# Patient Record
Sex: Female | Born: 1940 | Race: Black or African American | Hispanic: No | Smoking: Former smoker
Health system: Southern US, Community
[De-identification: ages and names within clinical notes are randomized; demographics above are authoritative.]

## PROBLEM LIST (undated history)

## (undated) DIAGNOSIS — IMO0002 Reserved for concepts with insufficient information to code with codable children: Secondary | ICD-10-CM

## (undated) DIAGNOSIS — Z973 Presence of spectacles and contact lenses: Secondary | ICD-10-CM

## (undated) DIAGNOSIS — IMO0001 Reserved for inherently not codable concepts without codable children: Secondary | ICD-10-CM

## (undated) DIAGNOSIS — K219 Gastro-esophageal reflux disease without esophagitis: Secondary | ICD-10-CM

## (undated) DIAGNOSIS — I10 Essential (primary) hypertension: Secondary | ICD-10-CM

## (undated) DIAGNOSIS — R634 Abnormal weight loss: Secondary | ICD-10-CM

## (undated) DIAGNOSIS — Z972 Presence of dental prosthetic device (complete) (partial): Secondary | ICD-10-CM

## (undated) DIAGNOSIS — E785 Hyperlipidemia, unspecified: Secondary | ICD-10-CM

## (undated) DIAGNOSIS — M1990 Unspecified osteoarthritis, unspecified site: Secondary | ICD-10-CM

## (undated) DIAGNOSIS — J40 Bronchitis, not specified as acute or chronic: Secondary | ICD-10-CM

## (undated) DIAGNOSIS — K8689 Other specified diseases of pancreas: Secondary | ICD-10-CM

## (undated) HISTORY — PX: TONSILLECTOMY: SUR1361

## (undated) HISTORY — DX: Reserved for inherently not codable concepts without codable children: IMO0001

## (undated) HISTORY — PX: BELPHAROPTOSIS REPAIR: SHX369

## (undated) HISTORY — DX: Reserved for concepts with insufficient information to code with codable children: IMO0002

## (undated) HISTORY — PX: APPENDECTOMY: SHX54

---

## 1975-09-03 HISTORY — PX: TUBAL LIGATION: SHX77

## 1991-09-03 HISTORY — PX: TOTAL ABDOMINAL HYSTERECTOMY W/ BILATERAL SALPINGOOPHORECTOMY: SHX83

## 1999-06-19 ENCOUNTER — Encounter: Admission: RE | Admit: 1999-06-19 | Discharge: 1999-06-19 | Payer: Self-pay | Admitting: Family Medicine

## 1999-06-19 ENCOUNTER — Encounter: Payer: Self-pay | Admitting: Family Medicine

## 1999-08-08 ENCOUNTER — Encounter: Admission: RE | Admit: 1999-08-08 | Discharge: 1999-08-08 | Payer: Self-pay | Admitting: Family Medicine

## 1999-08-08 ENCOUNTER — Encounter: Payer: Self-pay | Admitting: Family Medicine

## 2000-01-06 ENCOUNTER — Emergency Department (HOSPITAL_COMMUNITY): Admission: EM | Admit: 2000-01-06 | Discharge: 2000-01-06 | Payer: Self-pay

## 2000-10-29 ENCOUNTER — Encounter: Admission: RE | Admit: 2000-10-29 | Discharge: 2000-10-29 | Payer: Self-pay | Admitting: Family Medicine

## 2000-10-29 ENCOUNTER — Encounter: Payer: Self-pay | Admitting: Family Medicine

## 2002-09-13 ENCOUNTER — Encounter: Admission: RE | Admit: 2002-09-13 | Discharge: 2002-09-13 | Payer: Self-pay | Admitting: Family Medicine

## 2002-09-13 ENCOUNTER — Encounter: Payer: Self-pay | Admitting: Family Medicine

## 2002-10-01 ENCOUNTER — Encounter: Admission: RE | Admit: 2002-10-01 | Discharge: 2002-10-01 | Payer: Self-pay | Admitting: Family Medicine

## 2002-10-01 ENCOUNTER — Encounter: Payer: Self-pay | Admitting: Family Medicine

## 2003-03-09 ENCOUNTER — Emergency Department (HOSPITAL_COMMUNITY): Admission: EM | Admit: 2003-03-09 | Discharge: 2003-03-09 | Payer: Self-pay

## 2004-03-14 ENCOUNTER — Encounter: Admission: RE | Admit: 2004-03-14 | Discharge: 2004-03-14 | Payer: Self-pay | Admitting: Family Medicine

## 2004-12-13 ENCOUNTER — Encounter: Admission: RE | Admit: 2004-12-13 | Discharge: 2004-12-13 | Payer: Self-pay | Admitting: Family Medicine

## 2005-05-30 ENCOUNTER — Ambulatory Visit (HOSPITAL_COMMUNITY): Admission: RE | Admit: 2005-05-30 | Discharge: 2005-05-30 | Payer: Self-pay | Admitting: Gastroenterology

## 2006-07-29 ENCOUNTER — Encounter: Admission: RE | Admit: 2006-07-29 | Discharge: 2006-07-29 | Payer: Self-pay | Admitting: Family Medicine

## 2006-12-02 HISTORY — PX: CARDIAC CATHETERIZATION: SHX172

## 2006-12-11 ENCOUNTER — Inpatient Hospital Stay (HOSPITAL_BASED_OUTPATIENT_CLINIC_OR_DEPARTMENT_OTHER): Admission: RE | Admit: 2006-12-11 | Discharge: 2006-12-11 | Payer: Self-pay | Admitting: Interventional Cardiology

## 2007-09-10 ENCOUNTER — Encounter: Admission: RE | Admit: 2007-09-10 | Discharge: 2007-09-10 | Payer: Self-pay | Admitting: Family Medicine

## 2008-11-03 ENCOUNTER — Encounter: Admission: RE | Admit: 2008-11-03 | Discharge: 2008-11-03 | Payer: Self-pay | Admitting: Family Medicine

## 2010-08-02 ENCOUNTER — Encounter: Admission: RE | Admit: 2010-08-02 | Discharge: 2010-08-02 | Payer: Self-pay | Admitting: Family Medicine

## 2010-09-24 LAB — URINALYSIS, ROUTINE W REFLEX MICROSCOPIC
Ketones, ur: NEGATIVE mg/dL
Leukocytes, UA: NEGATIVE
Nitrite: NEGATIVE
Protein, ur: 30 mg/dL — AB
Specific Gravity, Urine: >1.03 — ABNORMAL HIGH (ref 1.005–1.030)
Urine Glucose, Fasting: NEGATIVE mg/dL
Urobilinogen, UA: 0.2 mg/dL (ref 0.0–1.0)
pH: 5.5 (ref 5.0–8.0)

## 2010-09-24 LAB — COMPREHENSIVE METABOLIC PANEL
ALT: 21 U/L (ref 0–35)
AST: 24 U/L (ref 0–37)
Albumin: 4 g/dL (ref 3.5–5.2)
Alkaline Phosphatase: 93 U/L (ref 39–117)
BUN: 15 mg/dL (ref 6–23)
Calcium: 9.4 mg/dL (ref 8.4–10.5)
Glucose, Bld: 86 mg/dL (ref 70–99)
Potassium: 3.6 mEq/L (ref 3.5–5.1)
Sodium: 139 mEq/L (ref 135–145)
Total Bilirubin: 0.6 mg/dL (ref 0.3–1.2)
Total Protein: 7.5 g/dL (ref 6.0–8.3)

## 2010-09-24 LAB — CBC
MCH: 28.2 pg (ref 26.0–34.0)
RDW: 13.4 % (ref 11.5–15.5)
WBC: 5.9 10*3/uL (ref 4.0–10.5)

## 2010-09-24 LAB — URINE MICROSCOPIC-ADD ON

## 2010-09-26 ENCOUNTER — Ambulatory Visit (HOSPITAL_COMMUNITY)
Admission: RE | Admit: 2010-09-26 | Discharge: 2010-09-27 | Payer: Self-pay | Source: Home / Self Care | Attending: Obstetrics and Gynecology | Admitting: Obstetrics and Gynecology

## 2010-09-27 LAB — CBC
HCT: 34.9 % — ABNORMAL LOW (ref 36.0–46.0)
MCH: 27.9 pg (ref 26.0–34.0)

## 2010-10-08 NOTE — Op Note (Signed)
[name], Sheila Hurst              ACCOUNT NO.:  [id number]  MEDICAL RECORD NO.:  [id number]          PATIENT TYPE:  OIB  LOCATION:  9316                          FACILITY:  [facility]  PHYSICIAN:  Guy Sandifer. Henderson Cloud, M.D. DATE OF BIRTH:  08/01/1941  DATE OF PROCEDURE:  09/26/2010 DATE OF DISCHARGE:                              OPERATIVE REPORT   PREOPERATIVE DIAGNOSES: 1. Pelvic relaxation. 2. Stress urinary continence.  POSTOPERATIVE DIAGNOSES: 1. Pelvic relaxation. 2. Stress urinary continence  PROCEDURE:  Anterior colporrhaphy, vaginal colpopexy, posterior colporrhaphy, transobturator mid urethral sling, and cystoscopy.  SURGEON:  Guy Sandifer. Henderson Cloud, M.D.  ANESTHESIA:  General with endotracheal intubation.  ESTIMATED BLOOD LOSS:  Less than 100 mL.  INDICATIONS AND CONSENT:  The patient is a 70 year old widowed black female G2, P2, status post hysterectomy with symptomatic pelvic relaxation and stress incontinence.  Details are dictated in the history and physical.  Anterior-posterior colporrhaphy mid urethral sling has been discussed preoperatively.  Potential risks and complications have been discussed preoperatively including but limited to infection, organ damage, bleeding requiring transfusion of blood products with HIV and hepatitis acquisition, DVT/PE, pneumonia, laparotomy, and pelvic pain. Vaginal narrowing, need for dilators, is sexually active, painful intercourse, pelvic pain, and recurrent pelvic relaxation have been discussed.  Success and failure rates of the sling, recurrent stress incontinence, postoperative irritative voiding symptoms, delayed healing, erosion, extrusion, pelvic pain, painful intercourse, prolonged catheterization, self-catheterization, return to the operating room have also been discussed.  All questions have been answered and consent was signed on the chart.  PROCEDURE:  The patient was taken to the operating room, where she was identified,  placed in dorsal supine position, and general anesthesia was induced via endotracheal intubation.  She was placed in the dorsal lithotomy position, where a time-out was undertaken.  She was then prepped and draped in a sterile fashion.  A Foley catheter was placed in the bladder.  The bladder was drained and the catheter was left in place.  The anterior vaginal mucosa was then injected with a dilute solution of 20 mL of 0.5% lidocaine with 1:200,000 epinephrine, diluted in 100 mL of normal saline.  An inverted T-shaped incision was made from the vaginal apex down the midline.  The vaginal mucosa was dissected from the underlying bladder sharply.  The cystocele was then reduced first with a pursestring and then with interrupted sutures, reapproximated the vesicovaginal fascia with 0 Monocryl pops.  This reduced the cystocele well.  Excess vaginal mucosa was trimmed, and the anterior vaginal mucosa was closed in a running locking fashion with 2-0 Monocryl suture.  The suburethral mucosa as well as the site for the transobturator incision after being marked are also injected with the same dilute solution.  Stab incisions were made bilaterally over the obturator foramen.  A midline suburethral incision was also made and dissection is carried out bilaterally toward the urogenital diaphragms. Then, using the Obtryx kit with the halo needles, the needles were passed through the incisions over the obturator foramen and exited below the urethra with passage of the needle tip guide with the examining finger.  After both were placed, the  Foley catheter is removed.  The patient has been given indigo carmine.  Cystoscopy with a 70-degree cystoscope was then carried out.  A 360-degree inspection reveals no foreign bodies, no evidence of perforation, and a good puff of urine from the ureters bilaterally.  Cystoscope was removed.  Foley catheter was replaced, and the bladder was drained.  The Obtryx  polypropylene mesh sling was then placed on the needle tips bilaterally which were withdrawn back through the incisions.  Sheath was removed.  The sling was lying flat with no rolls or kinks.  Care was taken to make sure that a Kelly clamp placed below the sling can easily be rotated perpendicular to the floor with no tension and there was 2- to 3-mm of space between the urethra and the sling.  Excess sling was trimmed at the level of the skin bilaterally which was closed with Dermabond.  The suburethral incision was closed in running locking fashion with a 2-0 Vicryl.  The posterior vaginal mucosa was then injected with the same dilute solution.  A small diamond shaped wedge of tissue was removed from the perineal body and the posterior vaginal mucosa was taken down in the midline approximately one-half to two-third to the length of the posterior vaginal mucosa.  Dissection was carried out bilaterally as well.  The rectovaginal mucosa was then reapproximated with interrupted 0 Monocryl suture.  Excess vaginal mucosa was trimmed, and the posterior vaginal mucosa was closed in running locking fashion with a 2-0 Vicryl suture as well.  Reinspection reveals no evidence of perforation of the vaginal mucosa with the sling as well as good hemostasis.  Vagina was then packed with a plain 1-inch gauze with estrogen vaginal cream.  All counts were correct.  The patient was awakened and taken to  the recovery room in stable condition.     Guy Sandifer Henderson Cloud, M.D.     JET/MEDQ  D:  09/26/2010  T:  09/27/2010  Job:  [id number]  Electronically Signed by Harold Hedge M.D. on 10/08/2010 09:01:25 AM

## 2010-10-08 NOTE — Discharge Summary (Signed)
  [name], Sheila Hurst              ACCOUNT NO.:  [id number]  MEDICAL RECORD NO.:  [id number]          PATIENT TYPE:  OIB  LOCATION:  9316                          FACILITY:  [facility]  PHYSICIAN:  Guy Sandifer. Henderson Cloud, M.D. DATE OF BIRTH:  [birth year]  DATE OF ADMISSION:  09/26/2010 DATE OF DISCHARGE:  09/27/2010                              DISCHARGE SUMMARY   ADMITTING DIAGNOSES: 1. Pelvic relaxation. 2. Stress urinary continence.  DISCHARGE DIAGNOSES: 1. Pelvic relaxation. 2. Stress urinary continence.  PROCEDURE:  On September 26, 2010, anterior colporrhaphy, vaginal colpopexy, posterior colporrhaphy, transobturator mid urethral sling, and cystoscopy.  REASON FOR ADMISSION:  The patient is a 70 year old widow black female G2, P2, status post hysterectomy with increasingly symptomatic pelvic relaxation and stress incontinence.  Details are dictated in the history and physical.  HOSPITAL COURSE:  The patient was taken to the operating room and undergoes the above procedure.  ESTIMATED BLOOD LOSS:  Less than 100 mL.  On the evening of surgery, she has good pain relief, was tolerating regular diet, and has been out of bed.  Vital signs are stable.  She has clear urine output.  She is afebrile.  On the day of discharge, hemoglobin is 11.4.  Vital signs are stable.  She is afebrile.  She is voiding well with no residual urine.  She is passing flatus, tolerating regular diet, and has good pain relief.  Abdomen is soft and nontender.  CONDITION ON DISCHARGE:  Good.  DIET:  Regular as tolerated.  ACTIVITY:  No lifting, operation of automobiles, no vaginal entry.  She is to call the office for problems including not limited to temperature 101 degrees, persistent nausea, vomiting, or heavy bleeding.  MEDICATIONS:  Colace daily for 1 month, Percocet 5/325 #20 one p.o. q.6 h. p.r.n,, ibuprofen 400 mg q.6 h. for 1-2 days and then p.r.n.  Follow up is in the office in 2  weeks.     Guy Sandifer Henderson Cloud, M.D.     JET/MEDQ  D:  09/27/2010  T:  09/28/2010  Job:  [id number]  Electronically Signed by Harold Hedge M.D. on 10/08/2010 09:01:17 AM

## 2011-01-18 NOTE — Op Note (Signed)
[name], Sheila Hurst              ACCOUNT NO.:  [id number]   MEDICAL RECORD NO.:  [id number]          PATIENT TYPE:  AMB   LOCATION:  ENDO                         FACILITY:  [facility]   PHYSICIAN:  James L. Malon Kindle., M.D.DATE OF BIRTH:  [birth year]   DATE OF PROCEDURE:  05/30/2005  DATE OF DISCHARGE:                                 OPERATIVE REPORT   PROCEDURE:  Colonoscopy.   MEDICATIONS:  Fentanyl 70 mcg, Versed 7 mg IV.   INDICATIONS FOR PROCEDURE:  Colon cancer screening.   SCOPE:  Pediatric adjustable scope.   DESCRIPTION OF PROCEDURE:  The procedure had been explained to the patient  and consent obtained. With the patient in the left lateral decubitus  position, the Olympus scope was inserted and advanced. The prep was  excellent. We reached the cecum using some slight abdominal pressure. The  ileocecal valve and appendiceal orifice were seen. The scope was withdrawn,  mucosa carefully examined. The cecum, ascending colon, transverse,  descending and sigmoid colon were seen well, no polyps were seen, no  significant diverticular disease. The rectum was free of polyps. The scope  was withdrawn, the patient tolerated the procedure well.   ASSESSMENT:  Normal screening colonoscopy, V76.51.   PLAN:  Will recommend yearly Hemoccult's and possibly repeat procedure in 10  years.           ______________________________  Llana Aliment Malon Kindle., M.D.     Waldron Session  D:  05/30/2005  T:  05/30/2005  Job:  [id number]   cc:   Bryan Lemma. Manus Gunning, M.D.  Fax: [phone]

## 2011-01-18 NOTE — Cardiovascular Report (Signed)
[name], Sheila Hurst NO.:  [id number]   MEDICAL RECORD NO.:  [id number]          PATIENT TYPE:  OIB   LOCATION:  1963                         FACILITY:  [facility]   PHYSICIAN:  Corky Crafts, MDDATE OF BIRTH:  09-13-1940   DATE OF PROCEDURE:  DATE OF DISCHARGE:                            CARDIAC CATHETERIZATION   REFERRING PHYSICIAN:  Dr. Blair Heys.   REASON FOR CATHETERIZATION:  Abnormal stress test and persistent dyspnea  on exertion and chest pressure.   PROCEDURE:  The risks and benefits of cardiac catheterization were  explained to the patient and informed consent was obtained.  The patient  was brought to the cath lab.  She was prepped and draped in the usual  sterile fashion.  Her right groin was infiltrated with 1% lidocaine.  A  4-French arterial sheath was placed into her right femoral artery using  modified Seldinger technique.  Left coronary artery angiography was  performed using a JL-4.0 catheter and the catheter was advanced to the  vessel ostium under fluoroscopic guidance.  Digital angiography was  performed in multiple projections using hand injection of contrast.  Right coronary artery angiography was then performed using a JR-4.0  catheter.  The catheter was advanced to the vessel ostium under  fluoroscopic guidance.  Digital angiography was performed in multiple  projections using hand injection of contrast.  Pigtail catheter was then  advanced to the ascending aorta and across the aortic valve under  fluoroscopic guidance.  A power injection of contrast was performed in  the RAO  position.  Pullback was performed under continuous hemodynamic  pullback.  The catheter was then pulled back to the abdominal aorta to  the level of the renal arteries.  Power injection of contrast was  performed in the AP projection to visualize the renal arteries.  The  sheath was removed using manual compression.   FINDINGS:  Left main is a short  vessel which was widely patent.  Left  anterior descending is a large vessel with mild luminal irregularities.  There are 3 diagonal vessels all of which appear small but widely  patent.  The circumflex is a large vessel proximally.  After the  branching of the OM-1 and OM-2 which are both medium-sized the remainder  of the circumflex is small.  There are minor irregularities throughout  the OM-1 and OM-2.  The right coronary artery is a large dominant vessel  which appears angiographically normal.  Ventriculogram revealed normal  left ventricular function.  There is no mitral regurgitation.  The  estimated ejection fraction is 60%.   HEMODYNAMIC RESULTS:  Left ventricular pressure 145/12 with an LVEDP of  13 mmHg.  Aortic pressure 145/77 with a mean aortic pressure of 106  mmHg.  The abdominal aortogram shows mild irregularities of the  infrarenal abdominal aorta.  There is no aneurysm.  There are single  renal arteries bilaterally both of which are widely patent.  The iliac  arteries bilaterally appear widely patent as well.   IMPRESSION:  1. No significant coronary artery disease.  2. Normal left ventricular function.  3. No renal  artery stenosis.  4. Normal hemodynamics.  5. Likely noncardiac chest pain.   RECOMMENDATIONS:  The patient to continue with aggressive risk factor  modification including blood pressure and lipid control.  Her dyspnea on  exertion may in fact be related to diastolic dysfunction from her high  blood pressure.  I will follow up with the patient.  She will also  follow with Dr. Manus Gunning.      Corky Crafts, MD  Electronically Signed     JSV/MEDQ  D:  12/11/2006  T:  12/11/2006  Job:  [id number]

## 2011-01-18 NOTE — Discharge Summary (Signed)
[name], Sheila Hurst NO.:  [id number]   MEDICAL RECORD NO.:  [id number]          PATIENT TYPE:  OIB   LOCATION:  1963                         FACILITY:  [facility]   PHYSICIAN:  Corky Crafts, MDDATE OF BIRTH:  [birth year]   DATE OF ADMISSION:  12/11/2006  DATE OF DISCHARGE:  12/11/2006                               DISCHARGE SUMMARY   DISCHARGE DIAGNOSES:  1. Nonobstructive coronary artery disease.  2. Hypertension.  3. Former smoker.  4. Hypercholesterolemia, treated.   HOSPITAL COURSE:  Sheila Hurst was seen in the office by Dr. Eldridge Dace  for chest pain.  A Cardiolite was performed and was abnormal showing a  reversible defect in the mid anterior wall.  EF was normal at 68%.   The patient was brought over to [facility] on December 11, 2006 for  cardiac catheterization.  The catheterization showed nonobstructive  disease.  However, hemodynamics did solidify that she did have  hypertension.  Plan:  Blood pressure control.   LABORATORY DATA:  Lab study performed at our office prior to the  patient's cath showed a white count of 6.4, hemoglobin 13.5, hematocrit  40.7, platelets 247.  Glucose 85, BUN 20, creatinine 0.9, potassium 3.9.   DISCHARGE INSTRUCTIONS:  The patient's cardiac catheterization discharge  instructions were given including: Activity.  Any concerns or questions  that the patient may have.  If the patient has further concerns she  needs to call us immediately.  An appointment was made for her to see  Dr. Eldridge Dace on Tuesday, April 22, at 11:15 a.m.  At this point she can  continue her home medications and we can augment these at the followup  visit.      Guy Franco, P.A.      Corky Crafts, MD  Electronically Signed    LB/MEDQ  D:  01/30/2007  T:  01/31/2007  Job:  [id number]

## 2011-08-19 ENCOUNTER — Other Ambulatory Visit: Payer: Self-pay | Admitting: Family Medicine

## 2011-08-19 DIAGNOSIS — Z1231 Encounter for screening mammogram for malignant neoplasm of breast: Secondary | ICD-10-CM

## 2011-08-29 ENCOUNTER — Ambulatory Visit
Admission: RE | Admit: 2011-08-29 | Discharge: 2011-08-29 | Disposition: A | Discharge status: Home / Self Care | Payer: PRIVATE HEALTH INSURANCE | Source: Ambulatory Visit | Attending: Family Medicine | Admitting: Family Medicine

## 2011-08-29 DIAGNOSIS — Z1231 Encounter for screening mammogram for malignant neoplasm of breast: Secondary | ICD-10-CM

## 2011-09-03 HISTORY — PX: OTHER SURGICAL HISTORY: SHX169

## 2011-09-06 ENCOUNTER — Other Ambulatory Visit: Payer: Self-pay | Admitting: Family Medicine

## 2011-09-06 DIAGNOSIS — R928 Other abnormal and inconclusive findings on diagnostic imaging of breast: Secondary | ICD-10-CM

## 2011-09-20 ENCOUNTER — Other Ambulatory Visit: Payer: PRIVATE HEALTH INSURANCE

## 2011-09-30 ENCOUNTER — Other Ambulatory Visit: Payer: PRIVATE HEALTH INSURANCE

## 2011-10-07 ENCOUNTER — Other Ambulatory Visit: Payer: PRIVATE HEALTH INSURANCE

## 2011-10-18 ENCOUNTER — Ambulatory Visit
Admission: RE | Admit: 2011-10-18 | Discharge: 2011-10-18 | Disposition: A | Discharge status: Home / Self Care | Payer: PRIVATE HEALTH INSURANCE | Source: Ambulatory Visit | Attending: Family Medicine | Admitting: Family Medicine

## 2011-10-18 DIAGNOSIS — R928 Other abnormal and inconclusive findings on diagnostic imaging of breast: Secondary | ICD-10-CM

## 2012-08-27 ENCOUNTER — Other Ambulatory Visit: Payer: Self-pay | Admitting: Family Medicine

## 2012-08-27 DIAGNOSIS — Z1231 Encounter for screening mammogram for malignant neoplasm of breast: Secondary | ICD-10-CM

## 2012-09-09 ENCOUNTER — Ambulatory Visit: Payer: PRIVATE HEALTH INSURANCE

## 2012-09-10 ENCOUNTER — Other Ambulatory Visit: Payer: Self-pay | Admitting: Family Medicine

## 2012-09-10 DIAGNOSIS — R109 Unspecified abdominal pain: Secondary | ICD-10-CM

## 2012-09-11 ENCOUNTER — Ambulatory Visit
Admission: RE | Admit: 2012-09-11 | Discharge: 2012-09-11 | Disposition: A | Discharge status: Home / Self Care | Payer: Medicare Other | Source: Ambulatory Visit | Attending: Family Medicine | Admitting: Family Medicine

## 2012-09-11 ENCOUNTER — Other Ambulatory Visit: Payer: Self-pay | Admitting: Family Medicine

## 2012-09-11 ENCOUNTER — Encounter (HOSPITAL_COMMUNITY): Payer: Self-pay | Admitting: *Deleted

## 2012-09-11 DIAGNOSIS — R109 Unspecified abdominal pain: Secondary | ICD-10-CM

## 2012-09-11 DIAGNOSIS — K8689 Other specified diseases of pancreas: Secondary | ICD-10-CM

## 2012-09-11 NOTE — Pre-Procedure Instructions (Signed)
Your procedure is scheduled JY:[id number], September 15, 2012 Report to [facility] Admitting [id number] Call this number if you have problems morning of your procedure:[phone]  Follow all bowel prep instructions per your doctor's orders.  Do not eat or drink anything after midnight the night before your procedure. You may brush your teeth, rinse out your mouth, but no water, no food, no chewing gum, no mints, no candies, no chewing tobacco.     Take these medicines the morning of your procedure with A SIP OF WATER:Dilitiazem and Atenolol   Please make arrangements for a responsible person to drive you home after the procedure. You cannot go home by cab/taxi. We recommend you have someone with you at home the first 24 hours after your procedure. Driver for procedure is daughter Maximino Greenland [phone] cell [phone]  LEAVE ALL VALUABLES, JEWELRY, BILLFOLD AT HOME.  NO DENTURES, CONTACT LENSES ALLOWED IN THE ENDOSCOPY ROOM.   YOU MAY WEAR DEODORANT, PLEASE REMOVE ALL JEWELRY, WATCHES RINGS, BODY PIERCINGS AND LEAVE AT HOME.   WOMEN: NO MAKE-UP, LOTIONS PERFUMES

## 2012-09-14 ENCOUNTER — Ambulatory Visit
Admission: RE | Admit: 2012-09-14 | Discharge: 2012-09-14 | Disposition: A | Discharge status: Home / Self Care | Payer: Medicare Other | Source: Ambulatory Visit | Attending: Family Medicine | Admitting: Family Medicine

## 2012-09-14 ENCOUNTER — Encounter (HOSPITAL_COMMUNITY): Payer: Self-pay | Admitting: *Deleted

## 2012-09-14 ENCOUNTER — Other Ambulatory Visit: Payer: Medicare Other

## 2012-09-14 DIAGNOSIS — K8689 Other specified diseases of pancreas: Secondary | ICD-10-CM

## 2012-09-14 MED ORDER — IOHEXOL 300 MG/ML  SOLN
100.0000 mL | Freq: Once | INTRAMUSCULAR | Status: AC | PRN
  Administered 2012-09-14: 100 mL via INTRAVENOUS

## 2012-09-15 ENCOUNTER — Encounter (HOSPITAL_COMMUNITY): Payer: Self-pay

## 2012-09-15 ENCOUNTER — Encounter (HOSPITAL_COMMUNITY)
Admission: RE | Disposition: A | Discharge status: Home / Self Care | Payer: Self-pay | Source: Ambulatory Visit | Attending: Gastroenterology

## 2012-09-15 ENCOUNTER — Encounter (HOSPITAL_COMMUNITY): Payer: Self-pay | Admitting: Anesthesiology

## 2012-09-15 ENCOUNTER — Ambulatory Visit (HOSPITAL_COMMUNITY)
Admission: RE | Admit: 2012-09-15 | Discharge: 2012-09-16 | Disposition: A | Discharge status: Home / Self Care | Payer: Medicare Other | Source: Ambulatory Visit | Attending: Gastroenterology | Admitting: Gastroenterology

## 2012-09-15 ENCOUNTER — Ambulatory Visit (HOSPITAL_COMMUNITY): Payer: Medicare Other | Admitting: Anesthesiology

## 2012-09-15 DIAGNOSIS — Z79899 Other long term (current) drug therapy: Secondary | ICD-10-CM | POA: Insufficient documentation

## 2012-09-15 DIAGNOSIS — E7800 Pure hypercholesterolemia, unspecified: Secondary | ICD-10-CM | POA: Insufficient documentation

## 2012-09-15 DIAGNOSIS — R109 Unspecified abdominal pain: Secondary | ICD-10-CM | POA: Insufficient documentation

## 2012-09-15 DIAGNOSIS — K7689 Other specified diseases of liver: Secondary | ICD-10-CM | POA: Insufficient documentation

## 2012-09-15 DIAGNOSIS — C257 Malignant neoplasm of other parts of pancreas: Secondary | ICD-10-CM | POA: Insufficient documentation

## 2012-09-15 DIAGNOSIS — C259 Malignant neoplasm of pancreas, unspecified: Secondary | ICD-10-CM

## 2012-09-15 DIAGNOSIS — I10 Essential (primary) hypertension: Secondary | ICD-10-CM | POA: Insufficient documentation

## 2012-09-15 DIAGNOSIS — K8689 Other specified diseases of pancreas: Secondary | ICD-10-CM

## 2012-09-15 HISTORY — DX: Hyperlipidemia, unspecified: E78.5

## 2012-09-15 HISTORY — DX: Unspecified osteoarthritis, unspecified site: M19.90

## 2012-09-15 HISTORY — PX: EUS: SHX5427

## 2012-09-15 HISTORY — DX: Essential (primary) hypertension: I10

## 2012-09-15 HISTORY — DX: Gastro-esophageal reflux disease without esophagitis: K21.9

## 2012-09-15 HISTORY — DX: Other specified diseases of pancreas: K86.89

## 2012-09-15 HISTORY — PX: FINE NEEDLE ASPIRATION: SHX5430

## 2012-09-15 LAB — CBC
HCT: 41.6 % (ref 36.0–46.0)
Hemoglobin: 14.1 g/dL (ref 12.0–15.0)
MCH: 28.4 pg (ref 26.0–34.0)
MCHC: 33.9 g/dL (ref 30.0–36.0)
RBC: 4.96 MIL/uL (ref 3.87–5.11)

## 2012-09-15 LAB — HEPATIC FUNCTION PANEL
ALT: 14 U/L (ref 0–35)
AST: 20 U/L (ref 0–37)
Total Protein: 7.8 g/dL (ref 6.0–8.3)

## 2012-09-15 SURGERY — ESOPHAGEAL ENDOSCOPIC ULTRASOUND (EUS) RADIAL
Anesthesia: Monitor Anesthesia Care

## 2012-09-15 MED ORDER — VITAMINS A & D EX OINT
TOPICAL_OINTMENT | CUTANEOUS | Status: AC
  Filled 2012-09-15: qty 5

## 2012-09-15 MED ORDER — SODIUM CHLORIDE 0.9 % IV SOLN: INTRAVENOUS | Status: DC

## 2012-09-15 MED ORDER — HYDROMORPHONE HCL PF 1 MG/ML IJ SOLN
1.0000 mg | INTRAMUSCULAR | Status: AC | PRN
  Administered 2012-09-15: 1 mg via INTRAVENOUS
  Filled 2012-09-15: qty 1

## 2012-09-15 MED ORDER — HYDRALAZINE HCL 20 MG/ML IJ SOLN
INTRAMUSCULAR | Status: AC
  Filled 2012-09-15: qty 1

## 2012-09-15 MED ORDER — DILTIAZEM HCL ER 90 MG PO CP12
90.0000 mg | ORAL_CAPSULE | Freq: Two times a day (BID) | ORAL | Status: DC
  Administered 2012-09-15 – 2012-09-16 (×3): 90 mg via ORAL
  Filled 2012-09-15 (×4): qty 1

## 2012-09-15 MED ORDER — BUTAMBEN-TETRACAINE-BENZOCAINE 2-2-14 % EX AERO
INHALATION_SPRAY | CUTANEOUS | Status: DC | PRN
  Administered 2012-09-15: 2 via TOPICAL

## 2012-09-15 MED ORDER — ATENOLOL 50 MG PO TABS
50.0000 mg | ORAL_TABLET | Freq: Every day | ORAL | Status: DC
  Administered 2012-09-15 – 2012-09-16 (×2): 50 mg via ORAL
  Filled 2012-09-15 (×2): qty 1

## 2012-09-15 MED ORDER — SODIUM CHLORIDE 0.9 % IV SOLN
INTRAVENOUS | Status: DC
  Administered 2012-09-15 – 2012-09-16 (×2): via INTRAVENOUS

## 2012-09-15 MED ORDER — ONDANSETRON HCL 4 MG/2ML IJ SOLN: 4.0000 mg | Freq: Four times a day (QID) | INTRAMUSCULAR | Status: AC | PRN

## 2012-09-15 MED ORDER — FENTANYL CITRATE 0.05 MG/ML IJ SOLN
25.0000 ug | INTRAMUSCULAR | Status: DC | PRN
  Administered 2012-09-15: 25 ug via INTRAVENOUS

## 2012-09-15 MED ORDER — HYDROCODONE-ACETAMINOPHEN 5-325 MG PO TABS
1.0000 | ORAL_TABLET | ORAL | Status: DC | PRN
  Administered 2012-09-15 – 2012-09-16 (×3): 1 via ORAL
  Filled 2012-09-15 (×3): qty 1

## 2012-09-15 MED ORDER — FENTANYL CITRATE 0.05 MG/ML IJ SOLN
INTRAMUSCULAR | Status: DC | PRN
  Administered 2012-09-15 (×4): 25 ug via INTRAVENOUS

## 2012-09-15 MED ORDER — LACTATED RINGERS IV SOLN
INTRAVENOUS | Status: DC
  Administered 2012-09-15: 07:00:00 via INTRAVENOUS

## 2012-09-15 MED ORDER — HYDRALAZINE HCL 20 MG/ML IJ SOLN
INTRAMUSCULAR | Status: DC | PRN
  Administered 2012-09-15: 5 mg via INTRAVENOUS

## 2012-09-15 MED ORDER — PROPOFOL 10 MG/ML IV EMUL
INTRAVENOUS | Status: DC | PRN
  Administered 2012-09-15: 50 ug/kg/min via INTRAVENOUS

## 2012-09-15 MED ORDER — LABETALOL HCL 5 MG/ML IV SOLN
INTRAVENOUS | Status: DC | PRN
  Administered 2012-09-15 (×2): 5 mg via INTRAVENOUS

## 2012-09-15 MED ORDER — FENTANYL CITRATE 0.05 MG/ML IJ SOLN
INTRAMUSCULAR | Status: AC
  Filled 2012-09-15: qty 2

## 2012-09-15 MED ORDER — HYDRALAZINE HCL 20 MG/ML IJ SOLN
10.0000 mg | INTRAMUSCULAR | Status: DC | PRN
  Administered 2012-09-15: 10 mg via INTRAVENOUS

## 2012-09-15 NOTE — Anesthesia Preprocedure Evaluation (Addendum)
Anesthesia Evaluation  Patient identified by MRN, date of birth, ID band Patient awake    Reviewed: Allergy & Precautions, H&P , NPO status , Patient's Chart, lab work & pertinent test results, reviewed documented beta blocker date and time   Airway Mallampati: II TM Distance: >3 FB Neck ROM: full    Dental  (+) Dental Advisory Given and Edentulous Upper   Pulmonary neg pulmonary ROS,  breath sounds clear to auscultation  Pulmonary exam normal       Cardiovascular Exercise Tolerance: Good hypertension, Rhythm:regular Rate:Normal     Neuro/Psych negative neurological ROS  negative psych ROS   GI/Hepatic negative GI ROS, Neg liver ROS, GERD-  ,Pancreatic mass   Endo/Other  negative endocrine ROS  Renal/GU negative Renal ROS  negative genitourinary   Musculoskeletal   Abdominal   Peds  Hematology negative hematology ROS (+)   Anesthesia Other Findings   Reproductive/Obstetrics negative OB ROS                          Anesthesia Physical Anesthesia Plan  ASA: II  Anesthesia Plan: MAC   Post-op Pain Management:    Induction:   Airway Management Planned: Simple Face Mask  Additional Equipment:   Intra-op Plan:   Post-operative Plan:   Informed Consent: I have reviewed the patients History and Physical, chart, labs and discussed the procedure including the risks, benefits and alternatives for the proposed anesthesia with the patient or authorized representative who has indicated his/her understanding and acceptance.   Dental Advisory Given  Plan Discussed with: CRNA and Surgeon  Anesthesia Plan Comments:         Anesthesia Quick Evaluation

## 2012-09-15 NOTE — H&P (Signed)
Patient with pancreatic neck mass.  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  She is being admitted for post-procedure discomfort.  Labs show slight increase in lipase, otherwise normal.  At present, patient has intermittent paroxysms of pain, similar to those she had pre-procedure.  She is tolerating her liquid diet.  On exam, she is in no acute distress.  Her abdomen is soft and non-tender.  Assessment: 1.  Pain post-EUS FNA, essentially resolved, perhaps mild post-FNA pancreatitis. 2.  Pancreatic mass with progressive abdominal pain.  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  Plan: 1.  Treatment of presumed mild pancreatitis. 2.  Advance diet. 3.  I will call Dr. Donell Beers and see if she can consult with patient tomorrow. 4.  Likely discharge tomorrow morning.

## 2012-09-15 NOTE — H&P (Signed)
Patient interval history reviewed.  Patient examined again.  There has been no change from documented H/P dated 09/14/12 (scanned into chart from our office) except as documented above.  Assessment:  1. Pancreatic mass  Plan:  1.  Endoscopic ultrasound with possible fine needle aspiration. 2.  Risks (bleeding, infection, bowel perforation that could require surgery, sedation-related changes in cardiopulmonary systems), benefits (identification and possible treatment of source of symptoms, exclusion of certain causes of symptoms), and alternatives (watchful waiting, radiographic imaging studies, empiric medical treatment) of upper endoscopy with ultrasound and possible fine needle aspiration (EUS +/- FNA) were explained to patient in detail and she wishes to proceed.

## 2012-09-15 NOTE — Op Note (Signed)
[facility] [address] [place], [address]   ENDOSCOPIC ULTRASOUND PROCEDURE REPORT  PATIENT: Sheila, Hurst  MR#: [id number] BIRTHDATE: 1941/06/11  GENDER: Female ENDOSCOPIST: Willis Modena, MD REFERRED BY:  Blair Heys, M.D. PROCEDURE DATE:  09/15/2012 PROCEDURE:   Upper EUS w/FNA ASA CLASS:      Class II INDICATIONS:   1.  abdominal pain, pancreatic mass. MEDICATIONS: MAC sedation, administered by CRNA and Cetacaine spray x 2  DESCRIPTION OF PROCEDURE:   After the risks benefits and alternatives of the procedure were  explained, informed consent was obtained. The patient was then placed in the left, lateral, decubitus postion and IV sedation was administered. Throughout the procedure, the patients blood pressure, pulse and oxygen saturations were monitored continuously.  Under direct visualization, the [id number]  endoscope was introduced through the mouth  and advanced to the bulb of duodenum .  Water was used as necessary to provide an acoustic interface.  Upon completion of the imaging, water was removed and the patient was sent to the recovery room in satisfactory condition.    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  Mass is in vicinity of, but does not appear to border or invade, the portal vein, SMV, or SV.  No involvement of SMA or celiac artery.  No adenopathy seen. Mass FNA biopsied x 6 (3 with 25g needle, 3 with 22g needle); preliminary cytology, reviewed in my presence by Dr. Luisa Hart, showed adenocarcinoma.  IMPRESSION:     As above.  Assuming this is adenocarcinoma, locoregional staging by EUS would be T3N0Mx by EUS.  RECOMMENDATIONS:     1.  Watch for potential complications of procedure. 2.  Await final cytology. 3.  If cytology confirms  adenocarcinoma, would arrange expedited surgical evaluation. 4.  Will discuss with Dr. Manus Gunning.   _______________________________ Sheila DoctorWillis Modena, MD 09/15/2012 9:10 AM   CC:

## 2012-09-15 NOTE — Transfer of Care (Signed)
Immediate Anesthesia Transfer of Care Note  Patient: Sheila Hurst  Procedure(s) Performed: Procedure(s) (LRB): ESOPHAGEAL ENDOSCOPIC ULTRASOUND (EUS) RADIAL (N/A) FINE NEEDLE ASPIRATION (FNA) LINEAR (N/A)  Patient Location: PACU  Anesthesia Type: MAC  Level of Consciousness: sedated, patient cooperative and responds to stimulaton  Airway & Oxygen Therapy: Patient Spontanous Breathing and Patient connected to face mask oxgen  Post-op Assessment: Report given to PACU RN and Post -op Vital signs reviewed and stable  Post vital signs: Reviewed and stable  Complications: No apparent anesthesia complications

## 2012-09-15 NOTE — Anesthesia Postprocedure Evaluation (Signed)
  Anesthesia Post-op Note  Patient: Sheila Hurst  Procedure(s) Performed: Procedure(s) (LRB): ESOPHAGEAL ENDOSCOPIC ULTRASOUND (EUS) RADIAL (N/A) FINE NEEDLE ASPIRATION (FNA) LINEAR (N/A)  Patient Location: PACU  Anesthesia Type: MAC  Level of Consciousness: awake and alert   Airway and Oxygen Therapy: Patient Spontanous Breathing  Post-op Pain: mild  Post-op Assessment: Post-op Vital signs reviewed, Patient's Cardiovascular Status Stable, Respiratory Function Stable, Patent Airway and No signs of Nausea or vomiting  Last Vitals:  Filed Vitals:   09/15/12 0853  BP: 162/81  Pulse: 63  Temp: 36.7 C  Resp: 21    Post-op Vital Signs: stable   Complications: No apparent anesthesia complications

## 2012-09-16 ENCOUNTER — Other Ambulatory Visit (INDEPENDENT_AMBULATORY_CARE_PROVIDER_SITE_OTHER): Payer: Self-pay | Admitting: General Surgery

## 2012-09-16 ENCOUNTER — Encounter (HOSPITAL_COMMUNITY): Payer: Self-pay | Admitting: Gastroenterology

## 2012-09-16 DIAGNOSIS — C259 Malignant neoplasm of pancreas, unspecified: Secondary | ICD-10-CM

## 2012-09-16 NOTE — Progress Notes (Signed)
Patient received discharge instructions with daughter at bedside. Both verbalized understanding of instructions, medications, and follow up appointments. Patient already had prescription from Dr Dulce Sellar for pain medication. Patient belongings packed. Patient to be taken downstairs via wheelchair to be discharged home.

## 2012-09-16 NOTE — Consult Note (Signed)
Reason for Consult:pancreatic cancer Referring Physician: Dr. Dulce Sellar.    Sheila Hurst is an 72 y.o. female.  HPI:  Pt is a 72 yo F who presented with pain in the upper abdomen and mild weight loss.  She underwent CT scanning which was positive for mass in head/neck of pancreas.  She was referred to Dr. Dulce Sellar for evaluation.  He performed EUS which showed mass in neck of pancreas with no evidence of vascular involvement.  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  She also has a mass in her liver on CT.  She is referred for evaluation.    Past Medical History  Diagnosis Date  . Hypertension   . GERD (gastroesophageal reflux disease)     no medication  . Arthritis     left knee  . Hyperlipidemia     Past Surgical History  Procedure Date  . Badder tack 09-2011  . Abdominal hysterectomy 1993  . Tonsillectomy     age 33  . Appendectomy 70's  . Tubal ligation   . Bladder tack   . Eus 09/15/2012    Procedure: ESOPHAGEAL ENDOSCOPIC ULTRASOUND (EUS) RADIAL;  Surgeon: Willis Modena, MD;  Location: [facility];  Service: Endoscopy;  Laterality: N/A;  + or - fna unsure of what scope  . Fine needle aspiration 09/15/2012    Procedure: FINE NEEDLE ASPIRATION (FNA) LINEAR;  Surgeon: Willis Modena, MD;  Location: [facility];  Service: Endoscopy;  Laterality: N/A;    History reviewed. No pertinent family history.  Social History:  reports that she has quit smoking. She has never used smokeless tobacco. She reports that she does not drink alcohol or use illicit drugs.  Allergies: No Known Allergies  Medications: MedicationsLong-Term  Prescriptions Show Facility-Administered Medications    aspirin 81 MG chewable tablet   atenolol (TENORMIN) 50 MG tablet   Biotin 1000 MCG tablet   diltiazem (CARDIZEM) 90 MG tablet   Glucosamine-Chondroit-Vit C-Mn (GLUCOSAMINE 1500 COMPLEX PO)   HYDROcodone-acetaminophen (NORCO/VICODIN) 5-325 MG per tablet   triamterene-hydrochlorothiazide  (MAXZIDE-25) 37.5-25 MG per tablet     Results for orders placed during the hospital encounter of 09/15/12 (from the past 48 hour(s))  HEPATIC FUNCTION PANEL     Status: Abnormal   Collection Time   09/15/12 11:50 AM      Component Value Range Comment   Total Protein 7.8  6.0 - 8.3 g/dL    Albumin 3.9  3.5 - 5.2 g/dL    AST 20  0 - 37 U/L    ALT 14  0 - 35 U/L    Alkaline Phosphatase 122 (*) 39 - 117 U/L    Total Bilirubin 0.4  0.3 - 1.2 mg/dL    Bilirubin, Direct <1.6  0.0 - 0.3 mg/dL REPEATED TO VERIFY   Indirect Bilirubin NOT CALCULATED  0.3 - 0.9 mg/dL   CBC     Status: Normal   Collection Time   09/15/12 11:50 AM      Component Value Range Comment   WBC 8.2  4.0 - 10.5 K/uL    RBC 4.96  3.87 - 5.11 MIL/uL    Hemoglobin 14.1  12.0 - 15.0 g/dL    HCT 10.9  60.4 - 54.0 %    MCV 83.9  78.0 - 100.0 fL    MCH 28.4  26.0 - 34.0 pg    MCHC 33.9  30.0 - 36.0 g/dL    RDW 98.1  19.1 - 47.8 %    Platelets 258  150 -  400 K/uL   LIPASE, BLOOD     Status: Abnormal   Collection Time   09/15/12 11:50 AM      Component Value Range Comment   Lipase 218 (*) 11 - 59 U/L     Ct Abdomen Pelvis W Wo Contrast  09/14/2012  *RADIOLOGY REPORT*  Clinical Data: Mass in the region of the head of pancreas as well as a right hepatic lobe lesion by ultrasound, follow-up  CT ABDOMEN AND PELVIS WITHOUT AND WITH CONTRAST  Technique:  Multidetector CT imaging of the abdomen and pelvis was performed without contrast material in one or both body regions, followed by contrast material(s) and further sections in one or both body regions.  Contrast: OMNIPAQUE IOHEXOL 300 MG/ML  SOLN  Comparison: Ultrasound of the abdomen of 09/11/2012  Findings: The lung bases are clear.  On the unenhanced study there is a low attenuation mass within the posterior right lobe of liver measuring 5.0 x 5.5 cm with an attenuation of 26 HU compared normal hepatic parenchyma of 59 HU.  There may be faint peripheral calcification within  this nodule.  No other hepatic abnormality is seen.  The gallbladder is unremarkable with no calcified gallstones noted.  There is a slightly lower attenuation irregular mass in the region of the head the pancreas that will be better assessed after contrast administration.  After contrast administration, during the arterial phase, the liver lesion described above does not appear to enhance.  The mass in the head of the pancreas is better visualized has a low attenuation measuring 3.1 x 2.7 x 2.5 cm.  The pancreatic duct is dilated from the point of the mass into the tail of the pancreas highly suspicious for pancreatic head malignancy.  The gastroduodenal artery courses adjacent to this pancreatic head lesion laterally, possibly traveling through a portion of this mass, and the portal vein and superior mesenteric vein course adjacent to this mass medially. Only a few small lymph nodes are present near the pancreatic head lesion.  The adrenal glands and spleen are unremarkable.  The stomach is moderately fluid distended with no significant abnormality noted. The common bile duct does not appear to be dilated.  The kidneys enhance with no calculus or mass and no hydronephrosis is seen. The abdominal aorta is normal in caliber with atheromatous change present.  The urinary bladder is not well distended but is unremarkable.  The uterus has been resected previously.  No adnexal lesion is seen. No fluid is noted within the pelvis.  A few scattered rectosigmoid colonic diverticula are noted.  The terminal ileum is unremarkable. 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  Degenerative disc disease is present most marked at L3-4.  IMPRESSION:  1.  Mass in the pancreatic head of 3.1 x 2.7 x 2.5 cm with dilatation of the distal pancreatic duct most consistent with pancreatic head neoplasm. 2.  Low attenuation  lesion in the posterior right lobe of liver of 5.0 x 5.5 cm.  This lesion is not typical of a metastatic focus or hemangioma, and either MRI or biopsy would be recommended.   Original Report Authenticated By: Dwyane Dee, M.D.     Review of Systems  Constitutional: Negative.   HENT: Negative.   Eyes: Negative.   Respiratory: Negative.   Cardiovascular:       High blood pressure  Gastrointestinal: Positive for abdominal pain.  Genitourinary: Negative.  Musculoskeletal: Positive for back pain.  Skin: Negative.   Neurological: Negative.   Endo/Heme/Allergies: Negative.   Psychiatric/Behavioral: Negative.    Blood pressure 125/62, pulse 51, temperature 98.1 F (36.7 C), temperature source Oral, resp. rate 16, height 5\' 4"  (1.626 m), weight 171 lb 3.2 oz (77.656 kg), SpO2 96.00%. Physical Exam  Constitutional: She is oriented to person, place, and time. She appears well-developed and well-nourished. No distress.  HENT:  Head: Normocephalic and atraumatic.  Eyes: Conjunctivae normal are normal. Pupils are equal, round, and reactive to light. No scleral icterus.  Neck: Normal range of motion. Neck supple. No thyromegaly present.  Cardiovascular: Normal rate and intact distal pulses.   Respiratory: Effort normal. No respiratory distress. She exhibits no tenderness.  GI: Soft. She exhibits no distension. There is no tenderness. There is no rebound and no guarding.  Musculoskeletal: Normal range of motion. She exhibits no edema and no tenderness.  Lymphadenopathy:    She has no cervical adenopathy.  Neurological: She is alert and oriented to person, place, and time. Coordination normal.  Skin: Skin is warm and dry. No rash noted. She is not diaphoretic. No erythema. No pallor.  Psychiatric: She has a normal mood and affect. Her behavior is normal. Judgment and thought content normal.    Assessment/Plan: Pancreatic cancer - Pt has liver mass that needs MR to evaluate.  Looks prob benign on  CT, but may need biopsy if MR not definitive.  Will set that up as outpt.  Pt to follow up with me in 2 weeks after MR +/- biopsy.  We have made appt when son should be able to come.    30 min spent in counseling.    Sheila Hurst 09/16/2012, 10:54 AM

## 2012-09-16 NOTE — Progress Notes (Signed)
Subjective: Couple episodes of pain, controlled with oral pain medication.  Objective: Vital signs in last 24 hours: Temp:  [98.1 F (36.7 C)-98.4 F (36.9 C)] 98.1 F (36.7 C) (01/15 0510) Pulse Rate:  [50-66] 55  (01/15 0650) Resp:  [12-25] 16  (01/15 0510) BP: (127-205)/(54-109) 153/54 mmHg (01/15 0510) SpO2:  [88 %-100 %] 96 % (01/15 0510) Weight:  [77.656 kg (171 lb 3.2 oz)] 77.656 kg (171 lb 3.2 oz) (01/14 1055) Weight change:  Last BM Date: 09/14/12  PE: GEN:  NAD ABD:  Soft  Assessment:  1.  Pancreatic mass, adenocarcinoma by preliminary EUS-guided biopsies. 2.  Abdominal pain, increase after procedure.  Pain well-controlled now, and is back at her pre-procedure baseline.  Doubt patient had significant post-procedure pancreatitis, and modest elevation of lipase would not be unexpected routinely after pancreatic biopsy. 3.  Right lobe liver lesion, unclear significance, characteristics not typical of metastasis on CT.  Plan:  1.  Saline lock fluids. 2.  Stop IV analgesics. 3.  Hydrocodone/acetaminophen prn pain. 4.  Dr. Donell Beers to see patient today, after which patient can be discharged with further follow-up planning per Dr. Donell Beers. 5.  Case discussed with Dr. Donell Beers.  Plans discussed with patient and her daughter today in detail.   Sheila Hurst 09/16/2012, 8:03 AM

## 2012-09-16 NOTE — Care Management (Signed)
Cm spoke with patient concerning dc planning with adult daughter at bedside. Per pt no needs stated. Daughter to assist with home care and tx.   Roxy Manns Zerek Litsey,RN,BSN [phone]

## 2012-09-17 ENCOUNTER — Ambulatory Visit
Admission: RE | Admit: 2012-09-17 | Discharge: 2012-09-17 | Disposition: A | Discharge status: Home / Self Care | Payer: Medicare Other | Source: Ambulatory Visit | Attending: Family Medicine | Admitting: Family Medicine

## 2012-09-17 ENCOUNTER — Telehealth (INDEPENDENT_AMBULATORY_CARE_PROVIDER_SITE_OTHER): Payer: Self-pay

## 2012-09-17 DIAGNOSIS — Z1231 Encounter for screening mammogram for malignant neoplasm of breast: Secondary | ICD-10-CM

## 2012-09-17 NOTE — Telephone Encounter (Signed)
Requested notes from [place] at Dr. Hulen Shouts office.  These will include notes from pt's PCP, Dr. Blair Heys.

## 2012-09-18 ENCOUNTER — Telehealth (INDEPENDENT_AMBULATORY_CARE_PROVIDER_SITE_OTHER): Payer: Self-pay

## 2012-09-18 ENCOUNTER — Other Ambulatory Visit (INDEPENDENT_AMBULATORY_CARE_PROVIDER_SITE_OTHER): Payer: Self-pay | Admitting: General Surgery

## 2012-09-18 DIAGNOSIS — R160 Hepatomegaly, not elsewhere classified: Secondary | ICD-10-CM

## 2012-09-18 NOTE — Telephone Encounter (Signed)
Pt given MRI appt for 09/28/12, [facility] at 8:00am.

## 2012-09-18 NOTE — Discharge Summary (Signed)
[facility]astroenterology Discharge Summary   LETONYA MANGELS MRN: [id number]  Admit date: 09/15/2012 Discharge date: 09/16/2012  Admission Diagnoses: Pancreatic cancer, abdominal pain  Discharge Diagnoses: Pancreatic cancer, abdominal pain  Active Problems:  * No active hospital problems. *    Consults: Dr. Almond Lint, CCS  History of Present Illness:Mrs. Czerwinski is 72 yo female with progressive epigastric pain.  Found to have mass in neck of pancreas on CT and U/S.  Had EUS-guided pancreatic biopsy 1/14, confirming adenocarcinoma.  She had acute worsening of her chronic pain post-procedure, and was admitted for management/observation.  Hospital Course: Pain markedly improved with medical management.  Was able to tolerate oral diet and had pain controlled with oral pain medications, and was felt to be stable to discharge.  Patient did see Dr. Donell Beers pre-discharge, who will coordinate her further care (MRI of liver, and, pending this result, possible surgical resection of pancreatic mass.  Discharged Condition: Good  Disposition: 01-Home or Self Care  Discharge Orders    Future Appointments: Provider: Department: Dept Phone: Center:   09/29/2012 4:00 PM Almond Lint, MD [facility], [place] [phone] None     Future Orders Please Complete By Expires   Diet - low sodium heart healthy      Increase activity slowly          Medication List     As of 09/18/2012  8:35 AM    TAKE these medications         aspirin 81 MG chewable tablet   Chew 81 mg by mouth daily.      atenolol 50 MG tablet   Commonly known as: TENORMIN   Take 50 mg by mouth daily.      Biotin 1000 MCG tablet   Take 1,000 mcg by mouth daily.      diltiazem 90 MG tablet   Commonly known as: CARDIZEM   Take 90 mg by mouth 2 (two) times daily.      GLUCOSAMINE 1500 COMPLEX PO   Take 1 capsule by mouth daily.      HYDROcodone-acetaminophen 5-325 MG per tablet   Commonly known as: NORCO/VICODIN     Take 1-2 tablets by mouth every 6 (six) hours as needed. pain      triamterene-hydrochlorothiazide 37.5-25 MG per tablet   Commonly known as: MAXZIDE-25   Take 1 tablet by mouth daily.           Follow-up Information    Follow up with [facility], MD. Schedule an appointment as soon as possible for a visit on 09/29/2012. (4 pm.  )    Contact information:   [address] [place] [address] [phone]          Signed: Freddy Jaksch 09/18/2012, 8:35 AM

## 2012-09-23 ENCOUNTER — Other Ambulatory Visit: Payer: Medicare Other

## 2012-09-28 ENCOUNTER — Ambulatory Visit
Admission: RE | Admit: 2012-09-28 | Discharge: 2012-09-28 | Disposition: A | Discharge status: Home / Self Care | Payer: Medicare Other | Source: Ambulatory Visit | Attending: General Surgery | Admitting: General Surgery

## 2012-09-28 DIAGNOSIS — R160 Hepatomegaly, not elsewhere classified: Secondary | ICD-10-CM

## 2012-09-28 MED ORDER — GADOXETATE DISODIUM 0.25 MMOL/ML IV SOLN
8.0000 mL | Freq: Once | INTRAVENOUS | Status: AC | PRN
  Administered 2012-09-28: 8 mL via INTRAVENOUS

## 2012-09-29 ENCOUNTER — Encounter (INDEPENDENT_AMBULATORY_CARE_PROVIDER_SITE_OTHER): Payer: Self-pay | Admitting: General Surgery

## 2012-09-29 ENCOUNTER — Ambulatory Visit (INDEPENDENT_AMBULATORY_CARE_PROVIDER_SITE_OTHER): Payer: Medicare Other | Admitting: General Surgery

## 2012-09-29 VITALS — BP 152/80 | HR 60 | Temp 97.0°F | Resp 18

## 2012-09-29 DIAGNOSIS — C259 Malignant neoplasm of pancreas, unspecified: Secondary | ICD-10-CM

## 2012-09-29 NOTE — Progress Notes (Signed)
Patient ID: Sheila Hurst, female   DOB: 1940/11/14, 72 y.o.   MRN: [id number]  Chief Complaint  Patient presents with  . Mass    Liver    HPI Sheila Hurst is a 72 y.o. female.   HPI Patient is a 72 year old female whom I met in the hospital who presented with abdominal pain and was found to have a pancreatic mass. She has biopsy proven pancreatic adenocarcinoma. Her EUS today demonstrate invasion into the portal vein or SMV.  However on her MR and CT, the mass does distort the contour significantly and directly abuts a significant portion of the vein.  Since her discharge, she has not had a significant amount of pain and takes an occasional pain pill to control this. Her appetite is still decreased but she is being more conscious of eating small amounts throughout the day.  She was able to tolerate [employer] cookies. She states that some things do not sit very well. She denies fever, chills, nausea, or jaundice.   Past Medical History  Diagnosis Date  . Hypertension   . GERD (gastroesophageal reflux disease)     no medication  . Arthritis     left knee  . Hyperlipidemia     Past Surgical History  Procedure Date  . Badder tack 09-2011  . Abdominal hysterectomy 1993  . Tonsillectomy     age 61  . Appendectomy 70's  . Tubal ligation   . Bladder tack   . Eus 09/15/2012    Procedure: ESOPHAGEAL ENDOSCOPIC ULTRASOUND (EUS) RADIAL;  Surgeon: Willis Modena, MD;  Location: [facility];  Service: Endoscopy;  Laterality: N/A;  + or - fna unsure of what scope  . Fine needle aspiration 09/15/2012    Procedure: FINE NEEDLE ASPIRATION (FNA) LINEAR;  Surgeon: Willis Modena, MD;  Location: [facility];  Service: Endoscopy;  Laterality: N/A;    No family history on file.  Social History History  Substance Use Topics  . Smoking status: Former Smoker    Quit date: 09/02/1978  . Smokeless tobacco: Never Used  . Alcohol Use: No    No Known Allergies  Current Outpatient  Prescriptions  Medication Sig Dispense Refill  . aspirin 81 MG chewable tablet Chew 81 mg by mouth daily.      Marland Kitchen atenolol (TENORMIN) 50 MG tablet Take 50 mg by mouth daily.      . Biotin 1000 MCG tablet Take 1,000 mcg by mouth daily.      Marland Kitchen diltiazem (CARDIZEM) 90 MG tablet Take 90 mg by mouth 2 (two) times daily.      . Glucosamine-Chondroit-Vit C-Mn (GLUCOSAMINE 1500 COMPLEX PO) Take 1 capsule by mouth daily.      Marland Kitchen HYDROcodone-acetaminophen (NORCO/VICODIN) 5-325 MG per tablet Take 1-2 tablets by mouth every 6 (six) hours as needed. pain      . triamterene-hydrochlorothiazide (MAXZIDE-25) 37.5-25 MG per tablet Take 1 tablet by mouth daily.        Review of Systems Review of Systems  Constitutional: Negative.   HENT: Negative.   Eyes: Negative.   Respiratory: Negative.   Cardiovascular: Negative.   Gastrointestinal:       Early satiety and decreased appetite  Genitourinary: Negative.   Musculoskeletal: Negative.   Neurological: Negative.   Hematological: Negative.   Psychiatric/Behavioral: Negative.     Blood pressure 152/80, pulse 60, temperature 97 F (36.1 C), temperature source Oral, resp. rate 18.  Physical Exam Physical Exam  Constitutional: She is oriented to person, place,  and time. She appears well-developed and well-nourished. No distress.  HENT:  Head: Normocephalic and atraumatic.  Right Ear: External ear normal.  Eyes: Pupils are equal, round, and reactive to light. Right eye exhibits no discharge. Left eye exhibits no discharge. No scleral icterus.  Neck: Normal range of motion. Neck supple. No tracheal deviation present. No thyromegaly present.  Cardiovascular: Normal rate, normal heart sounds and intact distal pulses.   Pulmonary/Chest: Effort normal. No respiratory distress.  Abdominal: Soft. Bowel sounds are normal. She exhibits no distension and no mass. There is no tenderness. There is no rebound and no guarding.  Musculoskeletal: Normal range of motion.  She exhibits no edema and no tenderness.  Lymphadenopathy:    She has no cervical adenopathy.  Neurological: She is alert and oriented to person, place, and time. Coordination normal.  Skin: Skin is warm and dry. No rash noted. She is not diaphoretic. No erythema. No pallor.  Psychiatric: She has a normal mood and affect. Her behavior is normal. Judgment and thought content normal.    Data Reviewed MRI abd 1. Mass in the pancreatic head most consistent with  adenocarcinoma.  2. 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  3. Indeterminate nonenhancing complex round lesion within the  right hepatic lobe has decreased in size from comparison MRI of  11/03/2008. Legrand Rams this to a represent benign complex cystic lesion  or potentially a biliary cystadenoma.    Assessment/Plan    Pancreatic adenocarcinoma Patient does not appear to have a metastatic liver lesion based on the MRI. Dr. Logan Bores is able to go back and check a lumbar MRI from several years ago that demonstrates that this mass is actually smaller.  However, the MRI makes it appear that the SMV and portal vein confluence are abutted and distorted.  I will set her up for upfront chemoradiation.    I will see her back in around 8 weeks. She at that point will be set up for surgery, barring development of advanced disease.  I discussed this strategy with the patient, her son, and daughter.    I will refer for dietitian as well.     30 minutes spent in counseling with family.   Sheila Hurst 09/29/2012, 4:52 PM

## 2012-09-29 NOTE — Assessment & Plan Note (Addendum)
Patient does not appear to have a metastatic liver lesion based on the MRI. Dr. Logan Bores is able to go back and check a lumbar MRI from several years ago that demonstrates that this mass is actually smaller.  However, the MRI makes it appear that the SMV and portal vein confluence are abutted and distorted.  I will set her up for upfront chemoradiation.    I will see her back in around 8 weeks. She at that point will be set up for surgery, barring development of advanced disease.  I discussed this strategy with the patient, her son, and daughter.    I will refer for dietitian as well.

## 2012-09-29 NOTE — Patient Instructions (Addendum)
Will make appointments for medical and radiation oncology.  Will follow up back up in around 8-12 weeks.  Will also make referral to dietitian

## 2012-09-30 ENCOUNTER — Telehealth: Payer: Self-pay | Admitting: Oncology

## 2012-09-30 ENCOUNTER — Telehealth: Payer: Self-pay | Admitting: *Deleted

## 2012-09-30 ENCOUNTER — Encounter: Payer: Self-pay | Admitting: Radiation Oncology

## 2012-09-30 NOTE — Telephone Encounter (Signed)
S/W pt in re NP appt 02/11 @ 1:30 w/Dr. Truett Perna.  Referring Dr. Donell Beers  Dx-Pancreatic Ca Welcome packet mailed.

## 2012-09-30 NOTE — Telephone Encounter (Signed)
Spoke with patient by phone and confirmed appointments with Dr. Mitzi Hansen and Dr. Truett Perna.  Contact names and phone numbers were given.

## 2012-10-01 ENCOUNTER — Ambulatory Visit
Admission: RE | Admit: 2012-10-01 | Discharge: 2012-10-01 | Disposition: A | Discharge status: Home / Self Care | Payer: Medicare Other | Source: Ambulatory Visit | Attending: Radiation Oncology | Admitting: Radiation Oncology

## 2012-10-01 ENCOUNTER — Encounter: Payer: Self-pay | Admitting: Radiation Oncology

## 2012-10-01 ENCOUNTER — Telehealth: Payer: Self-pay | Admitting: *Deleted

## 2012-10-01 VITALS — BP 151/71 | HR 49 | Temp 97.4°F | Wt 172.0 lb

## 2012-10-01 DIAGNOSIS — I10 Essential (primary) hypertension: Secondary | ICD-10-CM | POA: Insufficient documentation

## 2012-10-01 DIAGNOSIS — Z90710 Acquired absence of both cervix and uterus: Secondary | ICD-10-CM | POA: Insufficient documentation

## 2012-10-01 DIAGNOSIS — C259 Malignant neoplasm of pancreas, unspecified: Secondary | ICD-10-CM | POA: Insufficient documentation

## 2012-10-01 DIAGNOSIS — K219 Gastro-esophageal reflux disease without esophagitis: Secondary | ICD-10-CM | POA: Insufficient documentation

## 2012-10-01 DIAGNOSIS — E785 Hyperlipidemia, unspecified: Secondary | ICD-10-CM | POA: Insufficient documentation

## 2012-10-01 DIAGNOSIS — C250 Malignant neoplasm of head of pancreas: Secondary | ICD-10-CM | POA: Insufficient documentation

## 2012-10-01 DIAGNOSIS — M1990 Unspecified osteoarthritis, unspecified site: Secondary | ICD-10-CM | POA: Insufficient documentation

## 2012-10-01 DIAGNOSIS — Z79899 Other long term (current) drug therapy: Secondary | ICD-10-CM | POA: Insufficient documentation

## 2012-10-01 HISTORY — DX: Bronchitis, not specified as acute or chronic: J40

## 2012-10-01 HISTORY — DX: Other specified diseases of pancreas: K86.89

## 2012-10-01 HISTORY — DX: Abnormal weight loss: R63.4

## 2012-10-01 NOTE — Telephone Encounter (Signed)
Called patient to inform of test. lvm for a return call

## 2012-10-01 NOTE — Addendum Note (Signed)
Encounter addended by: Tessa Lerner, RN on: 10/01/2012  4:46 PM<BR>     Documentation filed: Charges VN

## 2012-10-01 NOTE — Progress Notes (Signed)
Please see the Nurse Progress Note in the MD Initial Consult Encounter for this patient. 

## 2012-10-01 NOTE — Progress Notes (Signed)
Radiation Oncology         [phone] ________________________________  Name: Sheila Hurst MRN: [id number]  Date: 10/01/2012  DOB: 1941-05-17  WU:[id number],NWGNFA R, MD  Almond Lint, MD   G. Rolm Baptise, MD  REFERRING PHYSICIAN: Almond Lint, MD   DIAGNOSIS: There were no encounter diagnoses.   HISTORY OF PRESENT ILLNESS::Sheila Hurst is a 72 y.o. female who is seen for an initial consultation visit. The patient noted that she was having some progressive abdominal/epigastric pain. The patient had a history of GERD but when this pain persisted she presented for further workup. A CT scan of what is obtained of the abdomen and this revealed a mass within the pancreatic head measuring 3.1 x 2.7 x 2.5 cm. There was dilatation of the distal pancreatic duct. There is also a low attenuation lesion within the right lobe of the liver.  This prompted further workup and the patient proceeded to undergo an endoscopic ultrasound with biopsy. This revealed a hypoechoic fairly well defined 3 cm mass within the pancreas associated with some ductal dilatation. This was consistent with a T3 N0 pancreatic cancer. A biopsy was performed and this returned positive for adenocarcinoma.  The patient later underwent an MRI scan of the abdomen on 09/28/2012. This once again revealed a mass within the pancreatic head consistent with adenocarcinoma. This measured 2.6 cm. This was noted to abut the S. MV and main portal vein and this was consistent with prior imaging. The gastroduodenal artery also courses along the mass. The liver lesion was felt to represent a benign complex cystic lesion or potentially a biliary cystadenoma.  The patient has seen Dr. Donell Beers for consideration of surgical resection. She feels that the patient would benefit from neoadjuvant treatment prior to possible surgical resection. Therefore I been asked to see Ms. Ranker today for consideration of such a treatment in terms of  radiation.     PREVIOUS RADIATION THERAPY: No   PAST MEDICAL HISTORY:  has a past medical history of Hypertension; GERD (gastroesophageal reflux disease); Arthritis; Hyperlipidemia; Pancreatic mass (09/15/2012); Weight loss; and Bronchitis.     PAST SURGICAL HISTORY: Past Surgical History  Procedure Date  . Badder tack 09-2011  . Abdominal hysterectomy 1993    total  . Tonsillectomy     age 88  . Appendectomy 70's  . Tubal ligation 1977  . Eus 09/15/2012    Procedure: ESOPHAGEAL ENDOSCOPIC ULTRASOUND (EUS) RADIAL;  Surgeon: Willis Modena, MD;  Location: [facility];  Service: Endoscopy;  Laterality: N/A;  + or - fna unsure of what scope  . Fine needle aspiration 09/15/2012    Procedure: FINE NEEDLE ASPIRATION (FNA) LINEAR;  Surgeon: Willis Modena, MD;  Location: [facility];  Service: Endoscopy;  Laterality: N/A;  . Belpharoptosis repair      FAMILY HISTORY: family history includes Pancreatic cancer in her paternal aunt and Throat cancer in her father.   SOCIAL HISTORY:  reports that she quit smoking about 34 years ago. Her smoking use included Cigarettes. She has a 7.2 pack-year smoking history. She has never used smokeless tobacco. She reports that she does not drink alcohol or use illicit drugs.   ALLERGIES: Review of patient's allergies indicates no known allergies.   MEDICATIONS:  Current Outpatient Prescriptions  Medication Sig Dispense Refill  . atenolol (TENORMIN) 50 MG tablet Take 50 mg by mouth daily.      Marland Kitchen diltiazem (CARDIZEM) 90 MG tablet Take 90 mg by mouth 2 (two) times daily.      Marland Kitchen  Glucosamine-Chondroit-Vit C-Mn (GLUCOSAMINE 1500 COMPLEX PO) Take 1 capsule by mouth daily.      Marland Kitchen HYDROcodone-acetaminophen (NORCO/VICODIN) 5-325 MG per tablet Take 1-2 tablets by mouth every 6 (six) hours as needed. pain      . triamterene-hydrochlorothiazide (MAXZIDE-25) 37.5-25 MG per tablet Take 1 tablet by mouth daily.      Marland Kitchen aspirin 81 MG chewable tablet Chew 81 mg by  mouth daily.      . Biotin 1000 MCG tablet Take 1,000 mcg by mouth daily.         REVIEW OF SYSTEMS:  A 15 point review of systems is documented in the electronic medical record. This was obtained by the nursing staff. However, I reviewed this with the patient to discuss relevant findings and make appropriate changes.  Pertinent items are noted in HPI.    PHYSICAL EXAM:  weight is 172 lb (78.019 kg). Her temperature is 97.4 F (36.3 C). Her blood pressure is 151/71 and her pulse is 49. Her oxygen saturation is 97%.   General: Well-developed, in no acute distress HEENT: Normocephalic, atraumatic; oral cavity clear Neck: Supple without any lymphadenopathy Cardiovascular: Regular rate and rhythm Respiratory: Clear to auscultation bilaterally GI: Soft, epigastric tenderness present, normal bowel sounds Extremities: No edema present Neuro: No focal deficits     LABORATORY DATA:  Lab Results  Component Value Date   WBC 8.2 09/15/2012   HGB 14.1 09/15/2012   HCT 41.6 09/15/2012   MCV 83.9 09/15/2012   PLT 258 09/15/2012   Lab Results  Component Value Date   NA 139 09/19/2010   K 3.6 09/19/2010   CL 100 09/19/2010   CO2 33* 09/19/2010   Lab Results  Component Value Date   ALT 14 09/15/2012   AST 20 09/15/2012   ALKPHOS 122* 09/15/2012   BILITOT 0.4 09/15/2012      RADIOGRAPHY: Ct Abdomen Pelvis W Wo Contrast  09/14/2012  *RADIOLOGY REPORT*  Clinical Data: Mass in the region of the head of pancreas as well as a right hepatic lobe lesion by ultrasound, follow-up  CT ABDOMEN AND PELVIS WITHOUT AND WITH CONTRAST  Technique:  Multidetector CT imaging of the abdomen and pelvis was performed without contrast material in one or both body regions, followed by contrast material(s) and further sections in one or both body regions.  Contrast: OMNIPAQUE IOHEXOL 300 MG/ML  SOLN  Comparison: Ultrasound of the abdomen of 09/11/2012  Findings: The lung bases are clear.  On the unenhanced study there  is a low attenuation mass within the posterior right lobe of liver measuring 5.0 x 5.5 cm with an attenuation of 26 HU compared normal hepatic parenchyma of 59 HU.  There may be faint peripheral calcification within this nodule.  No other hepatic abnormality is seen.  The gallbladder is unremarkable with no calcified gallstones noted.  There is a slightly lower attenuation irregular mass in the region of the head the pancreas that will be better assessed after contrast administration.  After contrast administration, during the arterial phase, the liver lesion described above does not appear to enhance.  The mass in the head of the pancreas is better visualized has a low attenuation measuring 3.1 x 2.7 x 2.5 cm.  The pancreatic duct is dilated from the point of the mass into the tail of the pancreas highly suspicious for pancreatic head malignancy.  The gastroduodenal artery courses adjacent to this pancreatic head lesion laterally, possibly traveling through a portion of this mass, and the portal  vein and superior mesenteric vein course adjacent to this mass medially. Only a few small lymph nodes are present near the pancreatic head lesion.  The adrenal glands and spleen are unremarkable.  The stomach is moderately fluid distended with no significant abnormality noted. The common bile duct does not appear to be dilated.  The kidneys enhance with no calculus or mass and no hydronephrosis is seen. The abdominal aorta is normal in caliber with atheromatous change present.  The urinary bladder is not well distended but is unremarkable.  The uterus has been resected previously.  No adnexal lesion is seen. No fluid is noted within the pelvis.  A few scattered rectosigmoid colonic diverticula are noted.  The terminal ileum is unremarkable. 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  Degenerative disc disease  is present most marked at L3-4.  IMPRESSION:  1.  Mass in the pancreatic head of 3.1 x 2.7 x 2.5 cm with dilatation of the distal pancreatic duct most consistent with pancreatic head neoplasm. 2.  Low attenuation lesion in the posterior right lobe of liver of 5.0 x 5.5 cm.  This lesion is not typical of a metastatic focus or hemangioma, and either MRI or biopsy would be recommended.   Original Report Authenticated By: Dwyane Dee, M.D.    Mr Abdomen W Wo Contrast  09/28/2012  *RADIOLOGY REPORT*  Clinical Data: Pancreatic lesion and liver lesion.  MRI ABDOMEN WITH AND WITHOUT CONTRAST  Technique:  Multiplanar multisequence MR imaging of the abdomen was performed both before and after administration of intravenous contrast.  Contrast:  8 ml there has  BUN and creatinine were obtained on site at [facility] at [address] Results:  BUN 14 mg/dL,  Creatinine 0.9 mg/dL.  Comparison: CT 09/14/2012, lumbar MRI 11/03/2008  Findings: Within the right hepatic lobe, there is a well- circumscribed rounded mass measuring 5.6 x 5.2 cm (image 27, series 11).  This rounded mass has  decreased in size from comparison lumbar MRI which time the lesion measured approximately 8.9 x 7.5 cm.  At that time of comparison MR, the lesion has a cystic appearance with  more intensity on the T2-weighted imaging than currently demonstrated.  Currently  lesion is intermediate intensity on T1 and T2-weighted imaging.  There is no evidence of postcontrast enhancement.  There is no restricted diffusion within the lesion.  Legrand Rams this to be a benign complex cystic lesion. There is no additional hepatic lesions present.  Within the body of the pancreas there is a irregular mass measuring 2.6 x 3.1 cm (image 64, series 9).  This lesion does restrict effusion has mild enhancement.  As described on comparison CT, the mass abuts the SMV and main portal vein.  The gastroduodenal artery courses along the mass.  Proximal to the mass there is  dilatation of the pancreatic duct into the tail.  The spleen, adrenal glands, and kidneys are unremarkable.  IMPRESSION:  1.  Mass in the pancreatic head most consistent with adenocarcinoma. 2.   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  3.  Indeterminate  nonenhancing complex round lesion within the right hepatic lobe has decreased in size from comparison MRI of 11/03/2008.  Legrand Rams this to a represent benign complex cystic lesion or  potentially a biliary cystadenoma.   Original Report Authenticated By: Genevive Bi, M.D.    US Abdomen Complete  09/11/2012  *RADIOLOGY  REPORT*  Clinical Data:  Abdominal pain  COMPLETE ABDOMINAL ULTRASOUND  Comparison:  None.  Findings:  Gallbladder:  The gallbladder is visualized and no gallstones are noted.  There is no pain over the gallbladder with compression.  Common bile duct:  The common bile duct is normal measuring 4.6 mm in diameter.  Liver:  The liver is somewhat inhomogeneous suggesting fatty infiltration.  However posteriorly in the lateral right lobe of liver there is an almost isoechoic rounded mass of 5.0 x 5.5 x 5.2 cm.  IVC:  Appears normal.  Pancreas:  There is a soft tissue mass in the region of the head of the pancreas of 2.8 x 2.5 cm.  Bowel gas unfortunately obscures the body and tail of the pancreas and ductal dilatation cannot be evaluated.  Spleen:  The spleen is normal measuring 4.5 cm sagittally.  Right Kidney:  No hydronephrosis is seen.  The right kidney measures 10.6 cm sagittally.  Left Kidney:  No hydronephrosis is noted.  The left kidney measures 10.7 cm.  Abdominal aorta:  The abdominal aorta is normal in caliber.  IMPRESSION:  1.  Rounded mass in the region of the head of the pancreas of 2.8 x 2.5 cm.  The remainder of the pancreas is largely obscured by bowel gas. 2.  Rounded isoechoic mass within the posterior right lobe of liver of 5.5 cm.  This could represent a metastatic lesion.  In view of the above finding, CT of the  abdomen and pelvis is recommended using pancreatic protocol to assess for pancreatic malignancy and possible metastatic spread. 3.  Probable fatty infiltration of the liver.   Original Report Authenticated By: Dwyane Dee, M.D.    Mm Digital Screening  09/18/2012  *RADIOLOGY REPORT*  Clinical Data: Screening.  DIGITAL BILATERAL SCREENING MAMMOGRAM WITH CAD  Comparison:  Previous exams.  FINDINGS:  ACR Breast Density Category 2: There is a scattered fibroglandular pattern.  No suspicious masses, architectural distortion, or calcifications are present.  Images were processed with CAD.  IMPRESSION: No mammographic evidence of malignancy.  A result letter of this screening mammogram will be mailed directly to the patient.  RECOMMENDATION: Screening mammogram in one year. (Code:SM-B-01Y)  BI-RADS CATEGORY 1:  Negative.   Original Report Authenticated By: Christiana Pellant, M.D.        IMPRESSION: The patient is a pleasant 72 year old female who clinically is presenting with a T3, N0, M0 adenocarcinoma of the pancreatic head. Workup thus far suggest that she is borderline resectable and would benefit from neoadjuvant treatment.   PLAN:  I will order a CT scan of the chest for further staging.  I discussed with the patient a potential course of radiotherapy consisting of 5-1/2 weeks. She is to see Dr. Truett Perna within the next couple of weeks to discuss potential concurrent chemotherapy as well.  I discussed with the patient the rationale of radiotherapy in this setting to attempt to address the local as you and convert the patient to a more easily resectable tumor. We discussed the benefit of such a treatment and also how this works together with chemotherapy. We also discussed the potential side effects and risks of treatment. All of her questions were answered.  The patient will be scheduled for a simulation such that we can proceed with treatment planning. We will then coordinate the patient's treatment  with medical oncology as well.    I spent 60 minutes minutes face to face with the patient and more than 50% of that time  was spent in counseling and/or coordination of care.    ________________________________   Radene Gunning, MD, PhD

## 2012-10-01 NOTE — Progress Notes (Signed)
Patient here with granddaughter  for radiation consultation for newly diagnosed pancreatic cancer.Abdominal pain controlled with hydrocodone.Patient sought medical advice after long-term indigestion and unresolved abdominal pain which was more evident after eating.Paternal aunt was diagnosed with pancreatic cancer.Medical oncology appointment with Dr.Sherrill scheduled for 10/13/12.

## 2012-10-05 ENCOUNTER — Ambulatory Visit
Admission: RE | Admit: 2012-10-05 | Discharge: 2012-10-05 | Disposition: A | Discharge status: Home / Self Care | Payer: Medicare Other | Source: Ambulatory Visit | Attending: Radiation Oncology | Admitting: Radiation Oncology

## 2012-10-05 DIAGNOSIS — K219 Gastro-esophageal reflux disease without esophagitis: Secondary | ICD-10-CM | POA: Insufficient documentation

## 2012-10-05 DIAGNOSIS — C250 Malignant neoplasm of head of pancreas: Secondary | ICD-10-CM

## 2012-10-05 DIAGNOSIS — I10 Essential (primary) hypertension: Secondary | ICD-10-CM | POA: Insufficient documentation

## 2012-10-05 DIAGNOSIS — K869 Disease of pancreas, unspecified: Secondary | ICD-10-CM | POA: Insufficient documentation

## 2012-10-05 LAB — COMPREHENSIVE METABOLIC PANEL (CC13)
ALT: 14 U/L (ref 0–55)
CO2: 27 mEq/L (ref 22–29)
Calcium: 9.2 mg/dL (ref 8.4–10.4)
Chloride: 104 mEq/L (ref 98–107)
Glucose: 118 mg/dl — ABNORMAL HIGH (ref 70–99)
Sodium: 142 mEq/L (ref 136–145)
Total Bilirubin: 0.3 mg/dL (ref 0.20–1.20)
Total Protein: 7 g/dL (ref 6.4–8.3)

## 2012-10-05 LAB — CANCER ANTIGEN 19-9: CA 19-9: 318.7 U/mL — ABNORMAL HIGH (ref <35.0)

## 2012-10-05 LAB — CBC WITH DIFFERENTIAL/PLATELET
BASO%: 0.8 % (ref 0.0–2.0)
Eosinophils Absolute: 0.2 10*3/uL (ref 0.0–0.5)
LYMPH%: 37 % (ref 14.0–49.7)
MCHC: 32.6 g/dL (ref 31.5–36.0)
MONO#: 0.4 10*3/uL (ref 0.1–0.9)
NEUT#: 2.8 10*3/uL (ref 1.5–6.5)
Platelets: 209 10*3/uL (ref 145–400)
RBC: 4.43 10*6/uL (ref 3.70–5.45)
RDW: 13.8 % (ref 11.2–14.5)
WBC: 5.5 10*3/uL (ref 3.9–10.3)
lymph#: 2 10*3/uL (ref 0.9–3.3)

## 2012-10-06 ENCOUNTER — Ambulatory Visit (HOSPITAL_COMMUNITY)
Admission: RE | Admit: 2012-10-06 | Discharge: 2012-10-06 | Disposition: A | Discharge status: Home / Self Care | Payer: Medicare Other | Source: Ambulatory Visit | Attending: Radiation Oncology | Admitting: Radiation Oncology

## 2012-10-06 ENCOUNTER — Telehealth (INDEPENDENT_AMBULATORY_CARE_PROVIDER_SITE_OTHER): Payer: Self-pay

## 2012-10-06 DIAGNOSIS — C259 Malignant neoplasm of pancreas, unspecified: Secondary | ICD-10-CM | POA: Insufficient documentation

## 2012-10-06 DIAGNOSIS — C250 Malignant neoplasm of head of pancreas: Secondary | ICD-10-CM

## 2012-10-06 DIAGNOSIS — I77819 Aortic ectasia, unspecified site: Secondary | ICD-10-CM | POA: Insufficient documentation

## 2012-10-06 DIAGNOSIS — K7689 Other specified diseases of liver: Secondary | ICD-10-CM | POA: Insufficient documentation

## 2012-10-06 DIAGNOSIS — R911 Solitary pulmonary nodule: Secondary | ICD-10-CM | POA: Insufficient documentation

## 2012-10-06 MED ORDER — IOHEXOL 300 MG/ML  SOLN
100.0000 mL | Freq: Once | INTRAMUSCULAR | Status: AC | PRN
  Administered 2012-10-06: 100 mL via INTRAVENOUS

## 2012-10-06 NOTE — Telephone Encounter (Signed)
Will make a referral for pt to see [facility].

## 2012-10-07 ENCOUNTER — Telehealth (INDEPENDENT_AMBULATORY_CARE_PROVIDER_SITE_OTHER): Payer: Self-pay

## 2012-10-07 NOTE — Telephone Encounter (Signed)
Waiting to hear back from Nutrition and Diabetes Management at [facility].

## 2012-10-08 ENCOUNTER — Telehealth: Payer: Self-pay | Admitting: Radiation Oncology

## 2012-10-08 ENCOUNTER — Ambulatory Visit
Admission: RE | Admit: 2012-10-08 | Discharge: 2012-10-08 | Disposition: A | Discharge status: Home / Self Care | Payer: Medicare Other | Source: Ambulatory Visit | Attending: Radiation Oncology | Admitting: Radiation Oncology

## 2012-10-08 ENCOUNTER — Other Ambulatory Visit (INDEPENDENT_AMBULATORY_CARE_PROVIDER_SITE_OTHER): Payer: Self-pay | Admitting: General Surgery

## 2012-10-08 VITALS — BP 176/85 | HR 51 | Temp 98.3°F | Resp 20 | Wt 169.3 lb

## 2012-10-08 DIAGNOSIS — R21 Rash and other nonspecific skin eruption: Secondary | ICD-10-CM | POA: Insufficient documentation

## 2012-10-08 DIAGNOSIS — C250 Malignant neoplasm of head of pancreas: Secondary | ICD-10-CM

## 2012-10-08 DIAGNOSIS — Z7982 Long term (current) use of aspirin: Secondary | ICD-10-CM | POA: Insufficient documentation

## 2012-10-08 DIAGNOSIS — C259 Malignant neoplasm of pancreas, unspecified: Secondary | ICD-10-CM

## 2012-10-08 DIAGNOSIS — R110 Nausea: Secondary | ICD-10-CM | POA: Insufficient documentation

## 2012-10-08 DIAGNOSIS — R634 Abnormal weight loss: Secondary | ICD-10-CM | POA: Insufficient documentation

## 2012-10-08 DIAGNOSIS — I82409 Acute embolism and thrombosis of unspecified deep veins of unspecified lower extremity: Secondary | ICD-10-CM | POA: Insufficient documentation

## 2012-10-08 DIAGNOSIS — Z510 Encounter for antineoplastic radiation therapy: Secondary | ICD-10-CM | POA: Insufficient documentation

## 2012-10-08 DIAGNOSIS — Z79899 Other long term (current) drug therapy: Secondary | ICD-10-CM | POA: Insufficient documentation

## 2012-10-08 MED ORDER — SODIUM CHLORIDE 0.9 % IJ SOLN
10.0000 mL | Freq: Once | INTRAMUSCULAR | Status: AC
  Administered 2012-10-08: 10 mL via INTRAVENOUS

## 2012-10-08 NOTE — Progress Notes (Signed)
Patient arrived, gave name and dob as identification, alert,oriented x3, c/o abdomen pain a 6 on 1-20 scale, reviewed labs :bun/cr normal, patient not diabetic or no allergies to IV dye or sulfa Started 22gIV helpock in right AC, x 1 attempt, excelleent blood return,patinet tolerated well, hasn't taken any meds this am,offerred blanket,& declined, offerred hot chocolate  9:43 AM

## 2012-10-08 NOTE — Addendum Note (Signed)
Encounter addended by: Lowella Petties, RN on: 10/08/2012 12:22 PM<BR>     Documentation filed: Inpatient Document Flowsheet

## 2012-10-08 NOTE — Telephone Encounter (Signed)
Met w patient to discuss RO billing. Pt advised she will need some help financially. I gave pt an EPP and MCD application to complete and bring back, as well as, Cancercare and ACS referral.   Dx: Malignant neoplasm of head of pancreas - Primary 157.0   Attending Rad: JM   Rad Tx: IMRT

## 2012-10-08 NOTE — Progress Notes (Signed)
CT Sim completed, patient IV heplock de accessed, placed 2 2x2 Gause over right AC ,taped , held arm up , no bleeding noted, patient tolerated well, no c/o pain reminded patient to leave on till this afternoon, escorted patient to Merla Riches, Firefighter, 11:18 AM

## 2012-10-09 NOTE — Progress Notes (Addendum)
  Radiation Oncology         [phone]) [phone] ________________________________  Name: ASAL TEAS MRN: [id number]  Date: 10/08/2012  DOB: 1941/03/15  SIMULATION AND TREATMENT PLANNING NOTE   CONSENT VERIFIED: yes   SET UP: Patient is set-up supine   IMMOBILIZATION: The following immobilization is used: alpha-cradle. This complex treatment device will be used on a daily basis during the patient's treatment.   Diagnosis: Pancreatic cancer   NARRATIVE: The patient was brought to the CT Simulation planning suite. Identity was confirmed. All relevant records and images related to the planned course of therapy were reviewed. Then, the patient was positioned in a stable reproducible clinical set-up for radiation therapy using an aquaplast mask. Skin markings were placed. The CT images were loaded into the planning software where the target and avoidance structures were contoured.The radiation prescription was entered and confirmed.   The patient will receive 50.4 Gy in 28 fractions.   Daily image guidance is ordered, and this will be used on a daily basis. This is necessary to ensure accurate and precise localization of the target in addition to accurate alignment of the normal tissue structures in this region.   Treatment planning then occurred.   I have requested : Intensity Modulated Radiotherapy (IMRT) is medically necessary for this case for the following reason: Dose homogeneity; the target is in close proximity to critical normal structures, including the kidneys and the liver. IMRT is thus medically to appropriately treat the patient.   Special treatment procedure  The patient will receive chemotherapy during the course of radiation treatment. The patient may experience increased or overlapping toxicity due to this combined-modality approach and the patient will be monitored for such problems. This may include extra lab work as necessary. This therefore constitutes a special treatment  procedure.     Respiratory motion simulation In order to account for effect of respiratory motion on target structures and other organs in the planning and delivery of radiotherapy, this patient underwent respiratory motion management simulation.  To accomplish this, when the patient was brought to the CT simulation planning suite, 4D respiratoy motion management CT images were obtained.  The CT images were loaded into the planning software.  Then, using a variety of tools including Cine, MIP, and standard views, the target volume and planning target volumes (PTV) were delineated.  Avoidance structures were contoured.  Treatment planning then occurred.  Dose volume histograms were generated and reviewed for each of the requested structure.  The resulting plan was carefully reviewed and approved today.    ________________________________  Radene Gunning, MD, PhD

## 2012-10-13 ENCOUNTER — Ambulatory Visit (HOSPITAL_BASED_OUTPATIENT_CLINIC_OR_DEPARTMENT_OTHER): Payer: Medicare Other | Admitting: Oncology

## 2012-10-13 ENCOUNTER — Ambulatory Visit: Payer: Medicare Other

## 2012-10-13 ENCOUNTER — Telehealth (INDEPENDENT_AMBULATORY_CARE_PROVIDER_SITE_OTHER): Payer: Self-pay

## 2012-10-13 ENCOUNTER — Encounter: Payer: Self-pay | Admitting: Oncology

## 2012-10-13 ENCOUNTER — Telehealth: Payer: Self-pay | Admitting: Oncology

## 2012-10-13 VITALS — BP 134/72 | HR 51 | Temp 97.4°F | Resp 18 | Ht 64.0 in | Wt 170.7 lb

## 2012-10-13 DIAGNOSIS — C259 Malignant neoplasm of pancreas, unspecified: Secondary | ICD-10-CM

## 2012-10-13 DIAGNOSIS — R109 Unspecified abdominal pain: Secondary | ICD-10-CM

## 2012-10-13 DIAGNOSIS — R630 Anorexia: Secondary | ICD-10-CM

## 2012-10-13 DIAGNOSIS — C250 Malignant neoplasm of head of pancreas: Secondary | ICD-10-CM

## 2012-10-13 DIAGNOSIS — M549 Dorsalgia, unspecified: Secondary | ICD-10-CM

## 2012-10-13 MED ORDER — CAPECITABINE 500 MG PO TABS: 1500.0000 mg | ORAL_TABLET | Freq: Two times a day (BID) | ORAL | Status: DC

## 2012-10-13 MED ORDER — PROCHLORPERAZINE MALEATE 10 MG PO TABS: 10.0000 mg | ORAL_TABLET | Freq: Four times a day (QID) | ORAL | Status: DC | PRN

## 2012-10-13 NOTE — Progress Notes (Signed)
Left message for dietician to contact patient for appointment to discuss her nutritional needs/issues. Made patient and daughter, Maximino Greenland aware of GI Nurse Navigator, Vicie Mutters and her role and provided information folder with contact information. Scheduled to begin radiation therapy on 10/19/12

## 2012-10-13 NOTE — Progress Notes (Signed)
Checked in new pt with no financial concerns. °

## 2012-10-13 NOTE — Progress Notes (Signed)
Faxed xeloda prescription to [facility] °

## 2012-10-13 NOTE — Telephone Encounter (Signed)
Pt's insurance would not cover DM/Nutrition counseling at [facility].  [facility] has contacted [facility] and will be scheduled there.

## 2012-10-14 ENCOUNTER — Ambulatory Visit: Payer: Medicare Other | Admitting: Nutrition

## 2012-10-14 ENCOUNTER — Encounter: Payer: Self-pay | Admitting: *Deleted

## 2012-10-14 NOTE — Progress Notes (Signed)
[facility] New Patient Consult   Referring MD: Bristyn Kulesza 72 y.o.  07-06-1941    Reason for Referral: Pancreas cancer     HPI:  She reports a history of abdominal pain for the past 9-12 months. An ultrasound of the abdomen on 09/11/2012 revealed a soft tissue mass at the head of the pancreas. A CT on 09/14/2012 confirmed a mass in the head of the pancreas. The gastroduodenal artery was noted to course of adjacent to the pancreatic head. The portal vein and superior mesenteric vein are adjacent to the medial aspect of the mass. A few small lymph nodes were noted in the pancreatic head. The common bile duct was not dilated. 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 A low attenuation lesion in the right liver measured 5 x 5.5 cm. An MRI was recommended for further evaluation area. An MRI of the liver on 09/28/2012 confirmed a mass in the right liver that has decreased in size compared to an MRI from 2010. The lesion was favored to represent a benign complex cyst. No additional hepatic lesion. The pancreatic mass was noted to abut the superior mesenteric vein and portal vein.  She was referred to Dr. Dulce Sellar and an endoscopic ultrasound on 09/15/2012 confirmed a mass in the neck of the pancreas with upstream pancreatic ductal dilatation. The mass was noted to be in the vicinity of but did not appear to border or invade the portal vein, superior mesenteric vein, or splenic vein. No involvement of the superior mesenteric artery or celiac artery. No adenopathy. FNA biopsies were obtained of the mass and the cytology confirmed adenocarcinoma.  She was referred to Dr. Donell Beers. Dr. Donell Beers has referred her for neoadjuvant therapy. Ms. Shatz is scheduled to begin radiation 10/19/2012.  The abdominal pain is relieved with hydrocodone.    Past Medical History  Diagnosis Date  . Hypertension   . GERD (gastroesophageal reflux disease)     no medication   . Arthritis     left knee  . Hyperlipidemia   . Pancreatic mass 09/15/2012    Head of Pancreas, malignant cells consistent w/adenocarcinoma  .  G2 P2        .      Past Surgical History  Procedure Laterality Date  . Badder tack  09-2011  . Abdominal hysterectomy  1993    total  . Tonsillectomy      age 89  . Appendectomy  70's  . Tubal ligation  1977  . Eus  09/15/2012    Procedure: ESOPHAGEAL ENDOSCOPIC ULTRASOUND (EUS) RADIAL;  Surgeon: Willis Modena, MD;  Location: [facility];  Service: Endoscopy;  Laterality: N/A;  + or - fna unsure of what scope  . Fine needle aspiration  09/15/2012    Procedure: FINE NEEDLE ASPIRATION (FNA) LINEAR;  Surgeon: Willis Modena, MD;  Location: [facility];  Service: Endoscopy;  Laterality: N/A;  . Belpharoptosis repair      Family History  Problem Relation Age of Onset  . Throat cancer Father     died age 31  . Pancreatic cancer Paternal Aunt    11 siblings. No other family history of cancer.  Current outpatient prescriptions:atenolol (TENORMIN) 50 MG tablet, Take 50 mg by mouth daily., Disp: , Rfl: ;  diltiazem (CARDIZEM) 90 MG tablet, Take 90 mg by mouth 2 (two) times daily., Disp: , Rfl: ;  Glucosamine-Chondroit-Vit C-Mn (GLUCOSAMINE 1500 COMPLEX PO), Take 1 capsule by mouth daily., Disp: ,  Rfl: ;  HYDROcodone-acetaminophen (NORCO/VICODIN) 5-325 MG per tablet, Take 1-2 tablets by mouth every 6 (six) hours as needed. pain, Disp: , Rfl:  triamterene-hydrochlorothiazide (MAXZIDE-25) 37.5-25 MG per tablet, Take 1 tablet by mouth daily., Disp: , Rfl: ;  aspirin 81 MG chewable tablet, Chew 81 mg by mouth daily., Disp: , Rfl: ;  capecitabine (XELODA) 500 MG tablet, Take 3 tablets (1,500 mg total) by mouth 2 (two) times daily after a meal. M-F on days of radiation only Total daily dose 3000 mg, Disp: 120 tablet, Rfl: 0 prochlorperazine (COMPAZINE) 10 MG tablet, Take 1 tablet (10 mg total) by mouth every 6 (six) hours as needed (nausea)., Disp: 60  tablet, Rfl: 1  Allergies: No Known Allergies  Social History: She is a widow. She worked in an office occupation. She quit smoking cigarettes in 1980. She drinks alcohol on rare occasion. No transfusion history. No risk factor for HIV or hepatitis.   ROS:   Positives include: Anorexia, 12 pound weight loss, occasional dyspnea when talking, intermittent "floaters" in the eyes. Mid abdominal pain. Chronic back pain.  A complete ROS was otherwise negative.  Physical Exam:  Blood pressure 134/72, pulse 51, temperature 97.4 F (36.3 C), temperature source Oral, resp. rate 18, height 5\' 4"  (1.626 m), weight 170 lb 11.2 oz (77.429 kg).  HEENT: Oropharynx without visible mass, neck without mass Lungs: Clear bilaterally Cardiac: Regular rate and rhythm Abdomen: No hepatosplenomegaly, no mass, mild tenderness on deep palpation near the umbilicus. No apparent ascites  Vascular: No leg edema Lymph nodes: No cervical, supra-clavicular, axillary, or inguinal nodes Neurologic: Alert and oriented, the motor exam appears intact in the upper and lower extremities Skin: No rash Musculoskeletal: No spine tenderness   LAB:  CBC  Lab Results  Component Value Date   WBC 5.5 10/05/2012   HGB 12.2 10/05/2012   HCT 37.5 10/05/2012   MCV 84.6 10/05/2012   PLT 209 10/05/2012     CMP      Component Value Date/Time   NA 142 10/05/2012 1234   NA 139 09/19/2010 1126   K 3.2* 10/05/2012 1234   K 3.6 09/19/2010 1126   CL 104 10/05/2012 1234   CL 100 09/19/2010 1126   CO2 27 10/05/2012 1234   CO2 33* 09/19/2010 1126   GLUCOSE 118* 10/05/2012 1234   GLUCOSE 86 09/19/2010 1126   BUN 19.0 10/05/2012 1234   BUN 15 09/19/2010 1126   CREATININE 0.8 10/05/2012 1234   CREATININE 0.71 09/19/2010 1126   CALCIUM 9.2 10/05/2012 1234   CALCIUM 9.4 09/19/2010 1126   PROT 7.0 10/05/2012 1234   PROT 7.8 09/15/2012 1150   ALBUMIN 3.3* 10/05/2012 1234   ALBUMIN 3.9 09/15/2012 1150   AST 16 10/05/2012 1234   AST 20 09/15/2012 1150   ALT 14  10/05/2012 1234   ALT 14 09/15/2012 1150   ALKPHOS 119 10/05/2012 1234   ALKPHOS 122* 09/15/2012 1150   BILITOT 0.30 10/05/2012 1234   BILITOT 0.4 09/15/2012 1150   GFRNONAA >60 09/19/2010 1126   GFRAA  Value: >60        The eGFR has been calculated using the MDRD equation. This calculation has not been validated in all clinical situations. eGFR's persistently <60 mL/min signify possible Chronic Kidney Disease. 09/19/2010 1126   CA 19-9 on 10/05/2012-318.7  Radiology: As per history of present illness    Assessment/Plan:   1. 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 Staging CT and MRI concerning for abutment of the confluence of the superior mesenteric vein and portal vein -Elevated CA 19-9   2. Pain secondary to pancreas cancer  3. Right liver cyst-smaller on an MRI 09/28/2012 compared to 11/03/2008  4. Hypertension  5. Hyperlipidemia  6. Family history pancreas cancer (paternal aunt)  7. Anorexia/weight loss   Disposition:   She has been diagnosed with adenocarcinoma the pancreas. She appears to have localized disease based on the staging evaluation to date.  Dr. Donell Beers recommends neoadjuvant therapy. She saw Dr. Mitzi Hansen and is scheduled to begin radiation 10/19/2012. I recommend concurrent capecitabine chemotherapy. We reviewed the potential toxicities associated with capecitabine including the chance for mucositis, diarrhea, and hematologic toxicity. We discussed the skin rash, hyperpigmentation, and hand/foot syndrome associated with capecitabine. She will attend a chemotherapy teaching class.  The plan is to begin concurrent capecitabine/radiation 07/19/2013. She will undergo a restaging x-ray evaluation and repeat CA 19-9 at the completion of neoadjuvant therapy.  Ms. Delahunty will return for an office visit in medical oncology on 11/02/2012.  Mertis Mosher 10/14/2012, 8:15 AM

## 2012-10-14 NOTE — Progress Notes (Signed)
This is a 72 year old female diagnosed with cancer of the pancreas.  She is a patient of Dr. Truett Perna and [place].  Past medical history includes hypertension, GERD, arthritis, hyperlipidemia, weight loss, and bronchitis.  Medications include Xeloda, glucosamine/chondroitin, and Compazine.  Labs include potassium 3.2, glucose 118, and albumin 3.3 on February third.  Height: 64 inches. Weight: 170.7 pounds. Usual body weight 180 pounds. BMI: 29.29.  Patient reports weight loss prior to diagnosis. She is not having any difficulties eating right now but has begun to drink regular boost. She denies nutritional side effects.  Nutrition diagnosis: Food and nutrition related knowledge deficit related to new diagnosis of pancreas cancer and associated treatments as evidenced by no prior need for nutrition related information.  Intervention: I educated patient and daughter on strategies for increasing calories and protein to promote weight maintenance. I've encouraged patient to consume small frequent meals with protein at each meal and snack. I've educated her to consume boost between meals as desired. I provided fact sheets, coupons, and my contact information. I've answered her questions. Teach back method used.  Monitoring, evaluation, goals: Patient will tolerate adequate calories and protein to minimize weight loss throughout treatment.  Next visit: Patient will contact me for questions or concerns.

## 2012-10-15 ENCOUNTER — Encounter: Payer: Medicare Other | Admitting: Nutrition

## 2012-10-15 ENCOUNTER — Other Ambulatory Visit: Payer: Medicare Other

## 2012-10-16 ENCOUNTER — Telehealth: Payer: Self-pay | Admitting: *Deleted

## 2012-10-16 NOTE — Telephone Encounter (Signed)
Spoke with patient to check in and answer questions. She has not received her Xeloda as of this time.  She is due to begin Xeloda and RT on 10/19/12.  Toll free number given to patient for [facility] as this is where rx was sent. She stated she would call and check to see when it is going to be delivered. Her questions regarding when to take drug were answered.  She was instructed to notify this office if she has not received Xeloda by 10/19/12.  She appreciated the call and confirmed she would do so.   Patient's name given to pharmacist, Thayer Ohm,  and asked to please follow patient on Xeloda.

## 2012-10-19 ENCOUNTER — Ambulatory Visit: Payer: Medicare Other | Admitting: Radiation Oncology

## 2012-10-19 ENCOUNTER — Telehealth: Payer: Self-pay | Admitting: *Deleted

## 2012-10-19 ENCOUNTER — Ambulatory Visit
Admission: RE | Admit: 2012-10-19 | Discharge: 2012-10-19 | Disposition: A | Discharge status: Home / Self Care | Payer: Medicare Other | Source: Ambulatory Visit | Attending: Radiation Oncology | Admitting: Radiation Oncology

## 2012-10-19 NOTE — Telephone Encounter (Signed)
Patient phoned to state her Xeloda had not come in the mail yet.  She started XRT today.  She stated a pharmacist from [facility] called her last Friday, 10/16/12, and said the drug should arrive today or tomorrow.  She stated she was told by [facility] that she still needs to complete a cancer care application in order to receive future supply of drugs.  Patient stated she would contact this office to notify when she receives and begins taking Xeloda.  Dr. Kalman Drape nurse, Darl Pikes, informed of above as well as [facility] pharmacist, Thayer Ohm, who will contact patient.

## 2012-10-20 ENCOUNTER — Telehealth: Payer: Self-pay | Admitting: Pharmacist

## 2012-10-20 ENCOUNTER — Ambulatory Visit: Payer: Medicare Other

## 2012-10-20 ENCOUNTER — Ambulatory Visit
Admission: RE | Admit: 2012-10-20 | Discharge: 2012-10-20 | Disposition: A | Discharge status: Home / Self Care | Payer: Medicare Other | Source: Ambulatory Visit | Attending: Radiation Oncology | Admitting: Radiation Oncology

## 2012-10-20 NOTE — Telephone Encounter (Signed)
Called and left VM for patient to call back to discuss new xeloda medication as part of Oral chemotherapy follow up. Left call back number  [phone]) for patient to call back. I will also try to reach patient again this week.   Christell Faith, PharmD

## 2012-10-21 ENCOUNTER — Ambulatory Visit
Admission: RE | Admit: 2012-10-21 | Discharge: 2012-10-21 | Disposition: A | Discharge status: Home / Self Care | Payer: Medicare Other | Source: Ambulatory Visit | Attending: Radiation Oncology | Admitting: Radiation Oncology

## 2012-10-21 ENCOUNTER — Telehealth: Payer: Self-pay | Admitting: Pharmacist

## 2012-10-21 ENCOUNTER — Ambulatory Visit: Payer: Medicare Other

## 2012-10-21 NOTE — Telephone Encounter (Signed)
Spoke to Sheila Hurst this afternoon and answered her questions regarding her xeloda medication. Per the patient, [facility] has shipped the medication and it will be delivered tomorrow (2/20). I discussed proper administration, dosing, side effects, safe handling, and dosing schedule. Patient verbalized understanding. Will follow up closely with patient on a weekly basis with phone calls to assess adherence and adverse effects.   Christell Faith, PharmD

## 2012-10-22 ENCOUNTER — Ambulatory Visit: Payer: Medicare Other

## 2012-10-22 ENCOUNTER — Encounter: Payer: Self-pay | Admitting: *Deleted

## 2012-10-22 ENCOUNTER — Ambulatory Visit
Admission: RE | Admit: 2012-10-22 | Discharge: 2012-10-22 | Disposition: A | Discharge status: Home / Self Care | Payer: Medicare Other | Source: Ambulatory Visit | Attending: Radiation Oncology | Admitting: Radiation Oncology

## 2012-10-22 NOTE — Progress Notes (Signed)
RECEIVED A FAX FROM [facility] CONCERNING A CONFIRMATION OF PRESCRIPTION SHIPMENT FOR XELODA ON 10/21/12. 

## 2012-10-23 ENCOUNTER — Encounter: Payer: Self-pay | Admitting: Radiation Oncology

## 2012-10-23 ENCOUNTER — Ambulatory Visit
Admission: RE | Admit: 2012-10-23 | Discharge: 2012-10-23 | Disposition: A | Discharge status: Home / Self Care | Payer: Medicare Other | Source: Ambulatory Visit | Attending: Radiation Oncology | Admitting: Radiation Oncology

## 2012-10-23 ENCOUNTER — Ambulatory Visit: Payer: Medicare Other

## 2012-10-23 VITALS — BP 158/92 | HR 69 | Temp 98.4°F | Wt 171.7 lb

## 2012-10-23 DIAGNOSIS — K8689 Other specified diseases of pancreas: Secondary | ICD-10-CM

## 2012-10-23 MED ORDER — RADIAPLEXRX EX GEL
Freq: Once | CUTANEOUS | Status: AC
  Administered 2012-10-23: 11:00:00 via TOPICAL

## 2012-10-23 NOTE — Addendum Note (Signed)
Encounter addended by: Eduardo Osier, RN on: 10/23/2012 11:20 AM<BR>     Documentation filed: Orders

## 2012-10-23 NOTE — Progress Notes (Signed)
  Radiation Oncology         [phone] ________________________________  Name: Sheila Hurst MRN: [id number]  Date: 10/23/2012  DOB: 12-Sep-1940  Weekly Radiation Therapy Management  Current Dose: 9 Gy     Planned Dose:  50.4 Gy  Narrative . . . . . . . . The patient presents for routine under treatment assessment.                                                       Patient here for routine weekly assessment of radiation ot abdomen.Reviewed routine of clinic and some of side effects to patient and daughter.Given Radiation Therapy and You booklet and radiaplex to apply to affected areas.Reviewd medications and when to take anti-emetic.Informed to take compazine in morning prior to breakfast and taking xeloda.Also to try stool softener daily but if diarrhea occurs to decrease or stop                                 Set-up films were reviewed.                                 The chart was checked. Physical Findings. . .  weight is 171 lb 11.2 oz (77.883 kg). Her temperature is 98.4 F (36.9 C). Her blood pressure is 158/92 and her pulse is 69. . Weight essentially stable.  No significant changes. Impression . . . . . . . The patient is tolerating radiation. Plan . . . . . . . . . . . . Continue treatment as planned.  ________________________________  Artist Pais. Kathrynn Running, M.D.

## 2012-10-23 NOTE — Progress Notes (Signed)
Patient here for routine weekly assessment of radiation ot abdomen.Reviewed routine of clinic and some of side effects to patient and daughter.Given Radiation Therapy and You booklet and radiaplex to apply to affected areas.Reviewd medications and when to take anti-emetic.Informed to take compazine in morning prior to breakfast and taking xeloda.Also to try stool softener daily but if diarrhea occurs to decrease or stop.

## 2012-10-23 NOTE — Addendum Note (Signed)
Encounter addended by: Eduardo Osier, RN on: 10/23/2012 11:21 AM<BR>     Documentation filed: Inpatient MAR

## 2012-10-26 ENCOUNTER — Ambulatory Visit: Payer: Medicare Other

## 2012-10-26 ENCOUNTER — Ambulatory Visit
Admission: RE | Admit: 2012-10-26 | Discharge: 2012-10-26 | Disposition: A | Discharge status: Home / Self Care | Payer: Medicare Other | Source: Ambulatory Visit | Attending: Radiation Oncology | Admitting: Radiation Oncology

## 2012-10-27 ENCOUNTER — Ambulatory Visit: Payer: Medicare Other

## 2012-10-27 ENCOUNTER — Ambulatory Visit
Admission: RE | Admit: 2012-10-27 | Discharge: 2012-10-27 | Disposition: A | Discharge status: Home / Self Care | Payer: Medicare Other | Source: Ambulatory Visit | Attending: Radiation Oncology | Admitting: Radiation Oncology

## 2012-10-28 ENCOUNTER — Ambulatory Visit
Admission: RE | Admit: 2012-10-28 | Discharge: 2012-10-28 | Disposition: A | Discharge status: Home / Self Care | Payer: Medicare Other | Source: Ambulatory Visit | Attending: Radiation Oncology | Admitting: Radiation Oncology

## 2012-10-28 ENCOUNTER — Telehealth: Payer: Self-pay | Admitting: Radiation Oncology

## 2012-10-28 ENCOUNTER — Ambulatory Visit: Payer: Medicare Other

## 2012-10-28 NOTE — Telephone Encounter (Signed)
INDIGENT APPROVED 95% FAMILY SIZE: 1 HH INC: 14782 MOD POV: 14587.50-15462.75 VALID DATES: 10/28/2012-04/27/2013 95% INDIGENT - PLEASE  APPLY DISCOUNT TO ANY  6 MONTHS PRIOR AND ALL CURRENT BILL.  [facility] $400 MCD application mailed to [facility] today.  Pt also  came by today to advised, she was unable to afford the boost or ensure types of drinks and wanted to know if we knew of a financial assistance program that could help her out. She said she cant have the very sweet brands - it needs to be the low fat kind or the diabetic brand types. Pt was referred to Zenovia Jarred for further assistance in this matter.

## 2012-10-29 ENCOUNTER — Ambulatory Visit: Payer: Medicare Other

## 2012-10-29 ENCOUNTER — Ambulatory Visit
Admission: RE | Admit: 2012-10-29 | Discharge: 2012-10-29 | Disposition: A | Discharge status: Home / Self Care | Payer: Medicare Other | Source: Ambulatory Visit | Attending: Radiation Oncology | Admitting: Radiation Oncology

## 2012-10-30 ENCOUNTER — Ambulatory Visit: Payer: Medicare Other | Admitting: Nutrition

## 2012-10-30 ENCOUNTER — Ambulatory Visit
Admission: RE | Admit: 2012-10-30 | Discharge: 2012-10-30 | Disposition: A | Discharge status: Home / Self Care | Payer: Medicare Other | Source: Ambulatory Visit | Attending: Radiation Oncology | Admitting: Radiation Oncology

## 2012-10-30 ENCOUNTER — Telehealth: Payer: Self-pay | Admitting: *Deleted

## 2012-10-30 ENCOUNTER — Ambulatory Visit: Payer: Medicare Other

## 2012-10-30 VITALS — BP 136/69 | HR 42 | Temp 98.8°F | Wt 169.5 lb

## 2012-10-30 DIAGNOSIS — C250 Malignant neoplasm of head of pancreas: Secondary | ICD-10-CM

## 2012-10-30 MED ORDER — HYDROCODONE-ACETAMINOPHEN 5-325 MG PO TABS: 1.0000 | ORAL_TABLET | Freq: Four times a day (QID) | ORAL | Status: DC | PRN

## 2012-10-30 NOTE — Progress Notes (Signed)
Patient here for weekly assessment of radiation to abdomen.Completed 10 of 28 treatments Mild pain, out of norco/vicodin as of this week.Nausea controlled.Skin unremarkable.

## 2012-10-30 NOTE — Progress Notes (Signed)
Patient presents to nutrition followup. She reports that she has been eating frequently throughout the day. Her weight was documented as 171.7 pounds February 21 which is stable. She really has no nutrition side effects. She is requesting assistance with oral nutrition supplements.  Nutrition diagnosis: Food and nutrition related knowledge deficit improved.  Intervention: I provided strategies for patient to tolerate supplements. I have provided her with some additional samples. I've given her recipes for modifying flavors of supplements. I provided information on purchasing these at a reduced cost. Teach back method used.  Monitoring, evaluation, goals: Patient will tolerate adequate calories and protein to promote continued weight stabilization.  Next visit: Patient will call me regarding tolerance of supplement samples.

## 2012-10-30 NOTE — Telephone Encounter (Signed)
xxxx 

## 2012-10-30 NOTE — Progress Notes (Signed)
   Department of Radiation Oncology  Phone:  [phone] Fax:        [phone]  Weekly Treatment Note    Name: Sheila Hurst Date: 10/30/2012 MRN: [id number] DOB: July 12, 1941   Current dose: 18 Gy  Current fraction: 10   MEDICATIONS: Current Outpatient Prescriptions  Medication Sig Dispense Refill  . aspirin 81 MG chewable tablet Chew 81 mg by mouth daily.      Marland Kitchen atenolol (TENORMIN) 50 MG tablet Take 50 mg by mouth daily.      . capecitabine (XELODA) 500 MG tablet Take 3 tablets (1,500 mg total) by mouth 2 (two) times daily after a meal. M-F on days of radiation only Total daily dose 3000 mg  120 tablet  0  . diltiazem (CARDIZEM) 90 MG tablet Take 90 mg by mouth 2 (two) times daily.      . Glucosamine-Chondroit-Vit C-Mn (GLUCOSAMINE 1500 COMPLEX PO) Take 1 capsule by mouth daily.      Marland Kitchen HYDROcodone-acetaminophen (NORCO/VICODIN) 5-325 MG per tablet Take 1-2 tablets by mouth every 6 (six) hours as needed. pain  40 tablet  0  . prochlorperazine (COMPAZINE) 10 MG tablet Take 1 tablet (10 mg total) by mouth every 6 (six) hours as needed (nausea).  60 tablet  1  . triamterene-hydrochlorothiazide (MAXZIDE-25) 37.5-25 MG per tablet Take 1 tablet by mouth daily.       No current facility-administered medications for this encounter.     ALLERGIES: Review of patient's allergies indicates no known allergies.   LABORATORY DATA:  Lab Results  Component Value Date   WBC 5.5 10/05/2012   HGB 12.2 10/05/2012   HCT 37.5 10/05/2012   MCV 84.6 10/05/2012   PLT 209 10/05/2012   Lab Results  Component Value Date   NA 142 10/05/2012   K 3.2* 10/05/2012   CL 104 10/05/2012   CO2 27 10/05/2012   Lab Results  Component Value Date   ALT 14 10/05/2012   AST 16 10/05/2012   ALKPHOS 119 10/05/2012   BILITOT 0.30 10/05/2012     NARRATIVE: Sheila Hurst was seen today for weekly treatment management. The chart was checked and the patient's films were reviewed. The patient indicates that she is doing  well. She does request a refill on her pain medication. This has been working well for her. She has some occasional nausea which is well controlled currently.  PHYSICAL EXAMINATION: weight is 169 lb 8 oz (76.885 kg). Her temperature is 98.8 F (37.1 C). Her blood pressure is 136/69 and her pulse is 42.        ASSESSMENT: The patient is doing satisfactorily with treatment.  PLAN: We will continue with the patient's radiation treatment as planned.

## 2012-11-02 ENCOUNTER — Ambulatory Visit: Payer: Medicare Other | Admitting: Oncology

## 2012-11-02 ENCOUNTER — Ambulatory Visit
Admission: RE | Admit: 2012-11-02 | Discharge: 2012-11-02 | Disposition: A | Discharge status: Home / Self Care | Payer: Medicare Other | Source: Ambulatory Visit | Attending: Radiation Oncology | Admitting: Radiation Oncology

## 2012-11-02 ENCOUNTER — Other Ambulatory Visit: Payer: Medicare Other | Admitting: Lab

## 2012-11-02 ENCOUNTER — Ambulatory Visit: Payer: Medicare Other

## 2012-11-03 ENCOUNTER — Ambulatory Visit: Payer: Medicare Other

## 2012-11-03 ENCOUNTER — Telehealth: Payer: Self-pay | Admitting: *Deleted

## 2012-11-03 NOTE — Telephone Encounter (Signed)
Not having radiation today due to road conditions-no transportation. Asking if she should hold her Xeloda also?  Confirmed with her to hold Xeloda on any day she does not get her radiation treatment. She already took am dose. Told her OK--just hold afternoon dose. She was unaware of her office visit appointment yesterday, but reports she is doing "fine". Will reschedule and call her with new appointment.

## 2012-11-03 NOTE — Telephone Encounter (Signed)
Patient called and left message that she had questions regarding her chemo oral drugs. Message forwarded to the desk RN.   JMW

## 2012-11-04 ENCOUNTER — Telehealth: Payer: Self-pay | Admitting: Oncology

## 2012-11-04 ENCOUNTER — Ambulatory Visit: Payer: Medicare Other

## 2012-11-04 ENCOUNTER — Ambulatory Visit
Admission: RE | Admit: 2012-11-04 | Discharge: 2012-11-04 | Disposition: A | Discharge status: Home / Self Care | Payer: Medicare Other | Source: Ambulatory Visit | Attending: Radiation Oncology | Admitting: Radiation Oncology

## 2012-11-04 NOTE — Telephone Encounter (Signed)
Called pt ansd left message regarding r/s appt from 3/3 to 3/12 per MD

## 2012-11-05 ENCOUNTER — Ambulatory Visit
Admission: RE | Admit: 2012-11-05 | Discharge: 2012-11-05 | Disposition: A | Discharge status: Home / Self Care | Payer: Medicare Other | Source: Ambulatory Visit | Attending: Radiation Oncology | Admitting: Radiation Oncology

## 2012-11-05 ENCOUNTER — Ambulatory Visit: Payer: Medicare Other

## 2012-11-06 ENCOUNTER — Ambulatory Visit: Payer: Medicare Other

## 2012-11-06 ENCOUNTER — Ambulatory Visit: Admission: RE | Admit: 2012-11-06 | Payer: Medicare Other | Source: Ambulatory Visit

## 2012-11-09 ENCOUNTER — Other Ambulatory Visit: Payer: Self-pay | Admitting: *Deleted

## 2012-11-09 ENCOUNTER — Ambulatory Visit: Payer: Medicare Other

## 2012-11-09 ENCOUNTER — Ambulatory Visit
Admission: RE | Admit: 2012-11-09 | Discharge: 2012-11-09 | Disposition: A | Discharge status: Home / Self Care | Payer: Medicare Other | Source: Ambulatory Visit | Attending: Radiation Oncology | Admitting: Radiation Oncology

## 2012-11-09 DIAGNOSIS — C259 Malignant neoplasm of pancreas, unspecified: Secondary | ICD-10-CM

## 2012-11-09 MED ORDER — CAPECITABINE 500 MG PO TABS: 1500.0000 mg | ORAL_TABLET | Freq: Two times a day (BID) | ORAL | Status: DC

## 2012-11-09 NOTE — Telephone Encounter (Signed)
THIS REFILL REQUEST FOR XELODA WAS GIVEN TO DR.SHADAD'S NURSE, Shannan Harper.

## 2012-11-10 ENCOUNTER — Ambulatory Visit: Payer: Medicare Other

## 2012-11-10 ENCOUNTER — Ambulatory Visit
Admission: RE | Admit: 2012-11-10 | Discharge: 2012-11-10 | Disposition: A | Discharge status: Home / Self Care | Payer: Medicare Other | Source: Ambulatory Visit | Attending: Radiation Oncology | Admitting: Radiation Oncology

## 2012-11-10 VITALS — BP 119/97 | HR 47 | Temp 97.6°F | Wt 164.1 lb

## 2012-11-10 DIAGNOSIS — C250 Malignant neoplasm of head of pancreas: Secondary | ICD-10-CM

## 2012-11-10 NOTE — Progress Notes (Signed)
[facility]    Radiation Oncology [address] [place]     Maryln Gottron, M.D. [place], [place] [address]               Billie Lade, M.D., Ph.D. Phone: [phone]      Molli Hazard A. Kathrynn Running, M.D. Fax: [phone]      Radene Gunning, M.D., Ph.D.         Lurline Hare, M.D.         Grayland Jack, M.D Weekly Treatment Management Note  Name: Sheila Hurst     MRN: [id number]        CSN: [id number] Date: 11/10/2012      DOB: Nov 16, 1940  CC: Thora Lance, MD         [place]    Status: Outpatient  Diagnosis: The encounter diagnosis was Malignant neoplasm of head of pancreas.  Current Dose: 27 Gy  Current Fraction: 15  Planned Dose: 50.4 Gy  Narrative: Sheila Hurst was seen today for weekly treatment management. The chart was checked and MVCT  were reviewed. She does have a poor appetite at this time but is trying a small frequent meals. She has met with the nutritionist for dietary information.  She denies any constipation or diarrhea or abdominal pain. Her nausea is reasonably controlled with Compazine.  Review of patient's allergies indicates no known allergies.  Current Outpatient Prescriptions  Medication Sig Dispense Refill  . aspirin 81 MG chewable tablet Chew 81 mg by mouth daily.      Marland Kitchen atenolol (TENORMIN) 50 MG tablet Take 50 mg by mouth daily.      . capecitabine (XELODA) 500 MG tablet Take 3 tablets (1,500 mg total) by mouth 2 (two) times daily after a meal. M-F on days of radiation only Total daily dose 3000 mg  48 tablet  0  . diltiazem (CARDIZEM) 90 MG tablet Take 90 mg by mouth 2 (two) times daily.      . Glucosamine-Chondroit-Vit C-Mn (GLUCOSAMINE 1500 COMPLEX PO) Take 1 capsule by mouth daily.      Marland Kitchen HYDROcodone-acetaminophen (NORCO/VICODIN) 5-325 MG per tablet Take 1-2 tablets by mouth every 6 (six) hours as needed. pain  40 tablet  0  . prochlorperazine (COMPAZINE) 10 MG tablet Take 1 tablet (10 mg total) by mouth every 6 (six) hours  as needed (nausea).  60 tablet  1  . triamterene-hydrochlorothiazide (MAXZIDE-25) 37.5-25 MG per tablet Take 1 tablet by mouth daily.       No current facility-administered medications for this encounter.    Physical Examination:  weight is 164 lb 1.6 oz (74.435 kg). Her temperature is 97.6 F (36.4 C). Her blood pressure is 119/97 and her pulse is 47. Her oxygen saturation is 100%.    Wt Readings from Last 3 Encounters:  11/10/12 164 lb 1.6 oz (74.435 kg)  10/30/12 169 lb 8 oz (76.885 kg)  10/23/12 171 lb 11.2 oz (77.883 kg)    The oral cavity is moist without secondary infection.  Lungs - Normal respiratory effort, chest expands symmetrically. Lungs are clear to auscultation, no crackles or wheezes.  Heart has regular rhythm and rate  Abdomen is soft and non tender with normal bowel sounds  Assessment:  Patient tolerating treatments well except for issues as above  Plan: Continue treatment per original radiation prescription

## 2012-11-10 NOTE — Progress Notes (Signed)
Patient for weekly assessment of radiation to abdomen.Completed 15 of 28 treatments.Denies pain but has some abdominal tenderness.No skin changes.Appetite down.Weight down 4 lbs in last week, already seen by nutritionist.Encouraged to eat small frequent meals.Will re-weigh at end of week to see how patient is doing to make further recommendations and follow up with nutritionist.

## 2012-11-11 ENCOUNTER — Ambulatory Visit: Payer: Medicare Other

## 2012-11-11 ENCOUNTER — Ambulatory Visit
Admission: RE | Admit: 2012-11-11 | Discharge: 2012-11-11 | Disposition: A | Discharge status: Home / Self Care | Payer: Medicare Other | Source: Ambulatory Visit | Attending: Radiation Oncology | Admitting: Radiation Oncology

## 2012-11-11 ENCOUNTER — Ambulatory Visit (HOSPITAL_BASED_OUTPATIENT_CLINIC_OR_DEPARTMENT_OTHER): Payer: Medicare Other | Admitting: Nurse Practitioner

## 2012-11-11 ENCOUNTER — Other Ambulatory Visit (HOSPITAL_BASED_OUTPATIENT_CLINIC_OR_DEPARTMENT_OTHER): Payer: Medicare Other | Admitting: Lab

## 2012-11-11 VITALS — BP 139/71 | HR 42 | Temp 97.0°F | Resp 18 | Ht 64.0 in | Wt 166.1 lb

## 2012-11-11 DIAGNOSIS — I498 Other specified cardiac arrhythmias: Secondary | ICD-10-CM

## 2012-11-11 DIAGNOSIS — C259 Malignant neoplasm of pancreas, unspecified: Secondary | ICD-10-CM

## 2012-11-11 DIAGNOSIS — Z800 Family history of malignant neoplasm of digestive organs: Secondary | ICD-10-CM

## 2012-11-11 DIAGNOSIS — C250 Malignant neoplasm of head of pancreas: Secondary | ICD-10-CM

## 2012-11-11 DIAGNOSIS — R110 Nausea: Secondary | ICD-10-CM

## 2012-11-11 DIAGNOSIS — K7689 Other specified diseases of liver: Secondary | ICD-10-CM

## 2012-11-11 LAB — CBC WITH DIFFERENTIAL/PLATELET
Eosinophils Absolute: 0.3 10*3/uL (ref 0.0–0.5)
HGB: 12.9 g/dL (ref 11.6–15.9)
MCV: 85.9 fL (ref 79.5–101.0)
MONO#: 0.7 10*3/uL (ref 0.1–0.9)
MONO%: 14.4 % — ABNORMAL HIGH (ref 0.0–14.0)
NEUT#: 3.3 10*3/uL (ref 1.5–6.5)
RBC: 4.55 10*6/uL (ref 3.70–5.45)
RDW: 14.5 % (ref 11.2–14.5)
WBC: 4.7 10*3/uL (ref 3.9–10.3)
lymph#: 0.5 10*3/uL — ABNORMAL LOW (ref 0.9–3.3)

## 2012-11-11 MED ORDER — ONDANSETRON HCL 8 MG PO TABS: ORAL_TABLET | ORAL | Status: DC

## 2012-11-11 NOTE — Progress Notes (Signed)
OFFICE PROGRESS NOTE  Interval history:  Sheila Hurst returns as scheduled. She continues radiation and Xeloda. She denies mouth sores. No diarrhea. No hand or foot redness. She intermittently notes mild pain over the balls of the feet are she has calluses. She notes increased nausea following radiation. She is taking Compazine as needed. Abdominal pain overall is better. She infrequently takes Vicodin.  She reports her heart rate was low in radiation today. She periodically notes mild dizziness. She does not feel as if she is going to pass out.   Objective: Blood pressure 139/71, pulse 42, temperature 97 F (36.1 C), temperature source Oral, resp. rate 18, height 5\' 4"  (1.626 m), weight 166 lb 1.6 oz (75.342 kg).  Oropharynx is without thrush or ulceration. Lungs are clear. Regular cardiac rhythm. Abdomen is soft and nontender. No hepatomegaly. Extremities are without edema. Palms and soles are without erythema. No skin breakdown.  Lab Results: Lab Results  Component Value Date   WBC 4.7 11/11/2012   HGB 12.9 11/11/2012   HCT 39.1 11/11/2012   MCV 85.9 11/11/2012   PLT 163 11/11/2012    Chemistry:    Chemistry      Component Value Date/Time   NA 142 10/05/2012 1234   NA 139 09/19/2010 1126   K 3.2* 10/05/2012 1234   K 3.6 09/19/2010 1126   CL 104 10/05/2012 1234   CL 100 09/19/2010 1126   CO2 27 10/05/2012 1234   CO2 33* 09/19/2010 1126   BUN 19.0 10/05/2012 1234   BUN 15 09/19/2010 1126   CREATININE 0.8 10/05/2012 1234   CREATININE 0.71 09/19/2010 1126      Component Value Date/Time   CALCIUM 9.2 10/05/2012 1234   CALCIUM 9.4 09/19/2010 1126   ALKPHOS 119 10/05/2012 1234   ALKPHOS 122* 09/15/2012 1150   AST 16 10/05/2012 1234   AST 20 09/15/2012 1150   ALT 14 10/05/2012 1234   ALT 14 09/15/2012 1150   BILITOT 0.30 10/05/2012 1234   BILITOT 0.4 09/15/2012 1150       Studies/Results: No results found.  Medications: I have reviewed the patient's current  medications.  Assessment/Plan:  1. 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 Elevated CA 19-9. She began radiation and concurrent Xeloda chemotherapy on 10/19/2012. 2. Pain secondary to pancreas cancer. Improved. 3. Right liver cyst. Smaller on MRI 09/28/2012 compared to 11/03/2008. 4. Hypertension currently taking atenolol, Cardizem and Maxide. 5. Hyperlipidemia. 6. Family history of pancreas cancer (paternal aunt). 7. Anorexia/weight loss. 8. Bradycardia. The heart rate has been in the 40 range over the past approximate 2 weeks when checked in our office. She does not appear symptomatic. We instructed her to hold atenolol. She will have vital signs repeated daily this week when here for radiation.  Disposition-Ms. Oros appears to be tolerating the radiation and Xeloda well. She is scheduled to complete the course of radiation on 11/27/2012. We will see her in followup on 11/26/2012. She will contact the office in the interim with any problems.  Plan reviewed with Dr. Truett Perna.  Lonna Cobb ANP/GNP-BC

## 2012-11-11 NOTE — Patient Instructions (Addendum)
Stop taking Atenolol for now. We will ask radiation oncology to check your heart rate and blood pressure every day this week when you are here for radiation treatment.

## 2012-11-11 NOTE — Progress Notes (Signed)
Patient to come to nursing daily after radiation treatment for blood pressure check.

## 2012-11-12 ENCOUNTER — Ambulatory Visit
Admission: RE | Admit: 2012-11-12 | Discharge: 2012-11-12 | Disposition: A | Discharge status: Home / Self Care | Payer: Medicare Other | Source: Ambulatory Visit | Attending: Radiation Oncology | Admitting: Radiation Oncology

## 2012-11-12 ENCOUNTER — Ambulatory Visit: Payer: Medicare Other

## 2012-11-13 ENCOUNTER — Ambulatory Visit
Admission: RE | Admit: 2012-11-13 | Discharge: 2012-11-13 | Disposition: A | Discharge status: Home / Self Care | Payer: Medicare Other | Source: Ambulatory Visit | Attending: Radiation Oncology | Admitting: Radiation Oncology

## 2012-11-13 ENCOUNTER — Ambulatory Visit: Payer: Medicare Other

## 2012-11-13 VITALS — BP 157/83 | HR 64 | Temp 97.6°F | Wt 167.6 lb

## 2012-11-13 DIAGNOSIS — C250 Malignant neoplasm of head of pancreas: Secondary | ICD-10-CM

## 2012-11-13 NOTE — Progress Notes (Signed)
   Department of Radiation Oncology  Phone:  [phone] Fax:        [phone]  Weekly Treatment Note    Name: Sheila Hurst Date: 11/13/2012 MRN: [id number] DOB: 18-Oct-1940   Current dose: 32.4 Gy  Current fraction: 18   MEDICATIONS: Current Outpatient Prescriptions  Medication Sig Dispense Refill  . aspirin 81 MG chewable tablet Chew 81 mg by mouth daily.      . capecitabine (XELODA) 500 MG tablet Take 3 tablets (1,500 mg total) by mouth 2 (two) times daily after a meal. M-F on days of radiation only Total daily dose 3000 mg  48 tablet  0  . diltiazem (CARDIZEM) 90 MG tablet Take 90 mg by mouth 2 (two) times daily.      . Glucosamine-Chondroit-Vit C-Mn (GLUCOSAMINE 1500 COMPLEX PO) Take 1 capsule by mouth daily.      Marland Kitchen HYDROcodone-acetaminophen (NORCO/VICODIN) 5-325 MG per tablet Take 1-2 tablets by mouth every 6 (six) hours as needed. pain  40 tablet  0  . ondansetron (ZOFRAN) 8 MG tablet Take one tab 30 minutes to 1 hour prior to radiation treatment.  20 tablet  0  . prochlorperazine (COMPAZINE) 10 MG tablet Take 1 tablet (10 mg total) by mouth every 6 (six) hours as needed (nausea).  60 tablet  1  . triamterene-hydrochlorothiazide (MAXZIDE-25) 37.5-25 MG per tablet Take 1 tablet by mouth daily.      Marland Kitchen atenolol (TENORMIN) 50 MG tablet Take 50 mg by mouth daily.       No current facility-administered medications for this encounter.     ALLERGIES: Review of patient's allergies indicates no known allergies.   LABORATORY DATA:  Lab Results  Component Value Date   WBC 4.7 11/11/2012   HGB 12.9 11/11/2012   HCT 39.1 11/11/2012   MCV 85.9 11/11/2012   PLT 163 11/11/2012   Lab Results  Component Value Date   NA 142 10/05/2012   K 3.2* 10/05/2012   CL 104 10/05/2012   CO2 27 10/05/2012   Lab Results  Component Value Date   ALT 14 10/05/2012   AST 16 10/05/2012   ALKPHOS 119 10/05/2012   BILITOT 0.30 10/05/2012     NARRATIVE: Sheila Hurst was seen today for weekly  treatment management. The chart was checked and the patient's films were reviewed. The patient is doing well this week. She states that her appetite has improved. Taking Zofran medication for her nausea has worked well. She has gained several pounds over the last week. Overall she feels better.  PHYSICAL EXAMINATION: weight is 167 lb 9.6 oz (76.023 kg). Her temperature is 97.6 F (36.4 C). Her blood pressure is 157/83 and her pulse is 64. Her oxygen saturation is 100%.        ASSESSMENT: The patient is doing satisfactorily with treatment.  PLAN: We will continue with the patient's radiation treatment as planned.

## 2012-11-13 NOTE — Progress Notes (Signed)
Patient here for routine weekly assessment.Completed 18 of 28 treatments.Appetite has improved once nausea controlled with taking zofran.Weight up 2.5 lbs over the last week.Heart rate up in 60's after atenolol put on hold by med/onc.Heart rate had dropped down in 40's.Patient affect improved and feels better.

## 2012-11-16 ENCOUNTER — Ambulatory Visit: Payer: Medicare Other

## 2012-11-17 ENCOUNTER — Ambulatory Visit
Admission: RE | Admit: 2012-11-17 | Discharge: 2012-11-17 | Disposition: A | Discharge status: Home / Self Care | Payer: Medicare Other | Source: Ambulatory Visit | Attending: Radiation Oncology | Admitting: Radiation Oncology

## 2012-11-17 VITALS — BP 150/86 | HR 70 | Temp 97.5°F | Wt 166.1 lb

## 2012-11-17 DIAGNOSIS — C259 Malignant neoplasm of pancreas, unspecified: Secondary | ICD-10-CM

## 2012-11-17 NOTE — Progress Notes (Signed)
Patient to nursing for assessment of rash side of neck  And underarms.Asked patient if she had worn anything with lambswools or angora and she stated "oh it must have been my pink sweater as I started itching right after wearing it."Informed to clean and dry areas and to continue application of hydrocortisone as she has already been doing.

## 2012-11-18 ENCOUNTER — Ambulatory Visit: Payer: Medicare Other

## 2012-11-18 ENCOUNTER — Ambulatory Visit
Admission: RE | Admit: 2012-11-18 | Discharge: 2012-11-18 | Disposition: A | Discharge status: Home / Self Care | Payer: Medicare Other | Source: Ambulatory Visit | Attending: Radiation Oncology | Admitting: Radiation Oncology

## 2012-11-19 ENCOUNTER — Ambulatory Visit
Admission: RE | Admit: 2012-11-19 | Discharge: 2012-11-19 | Disposition: A | Discharge status: Home / Self Care | Payer: Medicare Other | Source: Ambulatory Visit | Attending: Radiation Oncology | Admitting: Radiation Oncology

## 2012-11-19 ENCOUNTER — Ambulatory Visit: Payer: Medicare Other

## 2012-11-19 ENCOUNTER — Telehealth: Payer: Self-pay | Admitting: Oncology

## 2012-11-19 NOTE — Telephone Encounter (Signed)
Talked to pt and gave her appt for labs and ML visit on 11/26/12

## 2012-11-20 ENCOUNTER — Ambulatory Visit: Payer: Medicare Other

## 2012-11-20 ENCOUNTER — Ambulatory Visit
Admission: RE | Admit: 2012-11-20 | Discharge: 2012-11-20 | Disposition: A | Discharge status: Home / Self Care | Payer: Medicare Other | Source: Ambulatory Visit | Attending: Radiation Oncology | Admitting: Radiation Oncology

## 2012-11-20 VITALS — BP 131/78 | HR 62 | Temp 98.1°F | Wt 162.5 lb

## 2012-11-20 DIAGNOSIS — C250 Malignant neoplasm of head of pancreas: Secondary | ICD-10-CM

## 2012-11-20 NOTE — Progress Notes (Signed)
Sheila Hurst here for weekly ut visit.  She has had 22/28 fractions to her abdomen.  She denies pain at this time.  She is fatigued and feels week.  She does have nausea and has trouble eating solid food.  She takes a couple of bits and is not able to eat anymore.  She is able to take in liquids without any problems.  She is taking Compazine which she says is helping with the nausea.  She also has a red rash on both underarms and on the right side of her neck which started at the beginning of this week.  She said it is itchy.   The skin on her abdomen is intact.

## 2012-11-20 NOTE — Progress Notes (Signed)
Weekly Management Note Current Dose:39.6 Gy  Projected Dose:50.4 Gy   Narrative:  The patient presents for routine under treatment assessment.  CBCT/MVCT images/Port film x-rays were reviewed.  The chart was checked. Not eating much. Food turns to dust in her mouth. comapzine controlling nausea. Daughter helping make smoothies with Ensure clear. No pain. Rash under arms improving. Less on legs. Using hydrocortisone cream  Physical Findings:  Appears well. Alert and oriented. Macular rash under arms and right neck.   Vitals:  Filed Vitals:   11/20/12 1018  BP: 131/78  Pulse: 62  Temp: 98.1 F (36.7 C)   Weight:  Wt Readings from Last 3 Encounters:  11/20/12 162 lb 8 oz (73.71 kg)  11/17/12 166 lb 1.6 oz (75.342 kg)  11/13/12 167 lb 9.6 oz (76.023 kg)   Lab Results  Component Value Date   WBC 4.7 11/11/2012   HGB 12.9 11/11/2012   HCT 39.1 11/11/2012   MCV 85.9 11/11/2012   PLT 163 11/11/2012   Lab Results  Component Value Date   CREATININE 0.8 10/05/2012   BUN 19.0 10/05/2012   NA 142 10/05/2012   K 3.2* 10/05/2012   CL 104 10/05/2012   CO2 27 10/05/2012     Impression:  The patient is tolerating radiation.  Plan:  Continue treatment as planned. Add benadryl for rash. ? From Xeloda. Consult dietician for follow up given recent weight loss.

## 2012-11-23 ENCOUNTER — Ambulatory Visit
Admission: RE | Admit: 2012-11-23 | Discharge: 2012-11-23 | Disposition: A | Discharge status: Home / Self Care | Payer: Medicare Other | Source: Ambulatory Visit | Attending: Radiation Oncology | Admitting: Radiation Oncology

## 2012-11-24 ENCOUNTER — Ambulatory Visit: Payer: Medicare Other

## 2012-11-24 ENCOUNTER — Encounter (HOSPITAL_COMMUNITY): Payer: Self-pay | Admitting: *Deleted

## 2012-11-24 ENCOUNTER — Emergency Department (HOSPITAL_COMMUNITY)
Admission: EM | Admit: 2012-11-24 | Discharge: 2012-11-25 | Disposition: A | Discharge status: Home / Self Care | Payer: Medicare Other | Attending: Emergency Medicine | Admitting: Emergency Medicine

## 2012-11-24 ENCOUNTER — Encounter: Payer: Self-pay | Admitting: *Deleted

## 2012-11-24 ENCOUNTER — Ambulatory Visit
Admission: RE | Admit: 2012-11-24 | Discharge: 2012-11-24 | Disposition: A | Discharge status: Home / Self Care | Payer: Medicare Other | Source: Ambulatory Visit | Attending: Radiation Oncology | Admitting: Radiation Oncology

## 2012-11-24 ENCOUNTER — Emergency Department (HOSPITAL_COMMUNITY): Payer: Medicare Other

## 2012-11-24 DIAGNOSIS — Z79899 Other long term (current) drug therapy: Secondary | ICD-10-CM | POA: Insufficient documentation

## 2012-11-24 DIAGNOSIS — Z8509 Personal history of malignant neoplasm of other digestive organs: Secondary | ICD-10-CM | POA: Insufficient documentation

## 2012-11-24 DIAGNOSIS — Z8739 Personal history of other diseases of the musculoskeletal system and connective tissue: Secondary | ICD-10-CM | POA: Insufficient documentation

## 2012-11-24 DIAGNOSIS — Z7982 Long term (current) use of aspirin: Secondary | ICD-10-CM | POA: Insufficient documentation

## 2012-11-24 DIAGNOSIS — Z8709 Personal history of other diseases of the respiratory system: Secondary | ICD-10-CM | POA: Insufficient documentation

## 2012-11-24 DIAGNOSIS — R110 Nausea: Secondary | ICD-10-CM | POA: Insufficient documentation

## 2012-11-24 DIAGNOSIS — Z87891 Personal history of nicotine dependence: Secondary | ICD-10-CM | POA: Insufficient documentation

## 2012-11-24 DIAGNOSIS — I10 Essential (primary) hypertension: Secondary | ICD-10-CM | POA: Insufficient documentation

## 2012-11-24 DIAGNOSIS — Z8719 Personal history of other diseases of the digestive system: Secondary | ICD-10-CM | POA: Insufficient documentation

## 2012-11-24 DIAGNOSIS — R197 Diarrhea, unspecified: Secondary | ICD-10-CM | POA: Insufficient documentation

## 2012-11-24 DIAGNOSIS — R109 Unspecified abdominal pain: Secondary | ICD-10-CM | POA: Insufficient documentation

## 2012-11-24 DIAGNOSIS — K259 Gastric ulcer, unspecified as acute or chronic, without hemorrhage or perforation: Secondary | ICD-10-CM | POA: Insufficient documentation

## 2012-11-24 DIAGNOSIS — E785 Hyperlipidemia, unspecified: Secondary | ICD-10-CM | POA: Insufficient documentation

## 2012-11-24 LAB — CBC WITH DIFFERENTIAL/PLATELET
Eosinophils Relative: 7 % — ABNORMAL HIGH (ref 0–5)
Hemoglobin: 11.6 g/dL — ABNORMAL LOW (ref 12.0–15.0)
Lymphocytes Relative: 10 % — ABNORMAL LOW (ref 12–46)
Lymphs Abs: 0.5 10*3/uL — ABNORMAL LOW (ref 0.7–4.0)
MCV: 84.2 fL (ref 78.0–100.0)
Monocytes Relative: 9 % (ref 3–12)
Platelets: DECREASED 10*3/uL (ref 150–400)
RBC: 4.04 MIL/uL (ref 3.87–5.11)
WBC: 4.7 10*3/uL (ref 4.0–10.5)

## 2012-11-24 LAB — URINALYSIS, ROUTINE W REFLEX MICROSCOPIC
Bilirubin Urine: NEGATIVE
Glucose, UA: NEGATIVE mg/dL
Ketones, ur: NEGATIVE mg/dL
Protein, ur: NEGATIVE mg/dL

## 2012-11-24 LAB — COMPREHENSIVE METABOLIC PANEL
ALT: 19 U/L (ref 0–35)
Alkaline Phosphatase: 91 U/L (ref 39–117)
CO2: 30 mEq/L (ref 19–32)
GFR calc Af Amer: >90 mL/min (ref >90)
GFR calc non Af Amer: >90 mL/min (ref >90)
Glucose, Bld: 121 mg/dL — ABNORMAL HIGH (ref 70–99)
Potassium: 3.1 mEq/L — ABNORMAL LOW (ref 3.5–5.1)
Sodium: 138 mEq/L (ref 135–145)

## 2012-11-24 LAB — URINE MICROSCOPIC-ADD ON

## 2012-11-24 MED ORDER — IOHEXOL 300 MG/ML  SOLN
100.0000 mL | Freq: Once | INTRAMUSCULAR | Status: AC | PRN
  Administered 2012-11-24: 100 mL via INTRAVENOUS

## 2012-11-24 MED ORDER — ONDANSETRON HCL 4 MG/2ML IJ SOLN
4.0000 mg | Freq: Once | INTRAMUSCULAR | Status: DC
  Filled 2012-11-24: qty 2

## 2012-11-24 MED ORDER — PROCHLORPERAZINE EDISYLATE 5 MG/ML IJ SOLN
10.0000 mg | Freq: Once | INTRAMUSCULAR | Status: AC
  Administered 2012-11-24: 10 mg via INTRAVENOUS
  Filled 2012-11-24: qty 2

## 2012-11-24 MED ORDER — MORPHINE SULFATE 4 MG/ML IJ SOLN
4.0000 mg | Freq: Once | INTRAMUSCULAR | Status: AC
  Administered 2012-11-24: 4 mg via INTRAVENOUS
  Filled 2012-11-24: qty 1

## 2012-11-24 MED ORDER — SODIUM CHLORIDE 0.9 % IV BOLUS (SEPSIS)
500.0000 mL | Freq: Once | INTRAVENOUS | Status: AC
  Administered 2012-11-24: 1000 mL via INTRAVENOUS

## 2012-11-24 MED ORDER — HYDROCODONE-ACETAMINOPHEN 10-325 MG PO TABS: 1.0000 | ORAL_TABLET | Freq: Four times a day (QID) | ORAL | Status: DC | PRN

## 2012-11-24 MED ORDER — IOHEXOL 300 MG/ML  SOLN
50.0000 mL | Freq: Once | INTRAMUSCULAR | Status: AC | PRN
  Administered 2012-11-24: 50 mL via ORAL

## 2012-11-24 NOTE — ED Provider Notes (Signed)
History     CSN: [id number]  Arrival date & time 11/24/12  1191   First MD Initiated Contact with Patient 11/24/12 2014      Chief Complaint  Patient presents with  . Fever  . Abdominal Pain  . Diarrhea  . Nausea    (Consider location/radiation/quality/duration/timing/severity/associated sxs/prior treatment) Patient is a 72 y.o. female presenting with fever, abdominal pain, and diarrhea. The history is provided by the patient.  Fever Associated symptoms: diarrhea   Associated symptoms: no chest pain, no headaches, no nausea, no rash and no vomiting   Abdominal Pain Associated symptoms: diarrhea and fever   Associated symptoms: no chest pain, no nausea, no shortness of breath and no vomiting   Diarrhea Associated symptoms: abdominal pain and fever   Associated symptoms: no headaches and no vomiting    patient has had abdominal pain for last 2 days. She's had decreased appetite. She has pancreatic cancer and is on radiation and chemotherapy. She's had low-grade fevers. No cough. No dysuria. She has had some diarrhea and some nausea without vomiting. She states she's had a decreased appetite.  Past Medical History  Diagnosis Date  . Hypertension   . GERD (gastroesophageal reflux disease)     no medication  . Arthritis     left knee  . Hyperlipidemia   . Pancreatic mass 09/15/2012    Head of Pancreas, malignant cells consistent w/adenocarcinoma  . Weight loss     Prior to Dx  . Bronchitis     Past Surgical History  Procedure Laterality Date  . Badder tack  09-2011  . Abdominal hysterectomy  1993    total  . Tonsillectomy      age 36  . Appendectomy  70's  . Tubal ligation  1977  . Eus  09/15/2012    Procedure: ESOPHAGEAL ENDOSCOPIC ULTRASOUND (EUS) RADIAL;  Surgeon: Willis Modena, MD;  Location: [facility];  Service: Endoscopy;  Laterality: N/A;  + or - fna unsure of what scope  . Fine needle aspiration  09/15/2012    Procedure: FINE NEEDLE ASPIRATION (FNA) LINEAR;   Surgeon: Willis Modena, MD;  Location: [facility];  Service: Endoscopy;  Laterality: N/A;  . Belpharoptosis repair      Family History  Problem Relation Age of Onset  . Throat cancer Father     died age 32  . Pancreatic cancer Paternal Aunt     History  Substance Use Topics  . Smoking status: Former Smoker -- 0.30 packs/day for 24 years    Types: Cigarettes    Quit date: 09/02/1978  . Smokeless tobacco: Never Used  . Alcohol Use: No    OB History   Grav Para Term Preterm Abortions TAB SAB Ect Mult Living                  Review of Systems  Constitutional: Positive for fever and appetite change. Negative for activity change.  HENT: Negative for neck stiffness.   Eyes: Negative for pain.  Respiratory: Negative for chest tightness and shortness of breath.   Cardiovascular: Negative for chest pain and leg swelling.  Gastrointestinal: Positive for abdominal pain and diarrhea. Negative for nausea and vomiting.  Genitourinary: Negative for flank pain.  Musculoskeletal: Negative for back pain.  Skin: Negative for rash.  Neurological: Negative for weakness, numbness and headaches.  Psychiatric/Behavioral: Negative for behavioral problems.    Allergies  Ondansetron  Home Medications   Current Outpatient Rx  Name  Route  Sig  Dispense  Refill  . aspirin 81 MG chewable tablet   Oral   Chew 81 mg by mouth daily.         . capecitabine (XELODA) 500 MG tablet   Oral   Take 3 tablets (1,500 mg total) by mouth 2 (two) times daily after a meal. M-F on days of radiation only Total daily dose 3000 mg   48 tablet   0     Faxed to [facility].   Marland Kitchen diltiazem (CARDIZEM) 90 MG tablet   Oral   Take 90 mg by mouth 2 (two) times daily.         . Glucosamine-Chondroit-Vit C-Mn (GLUCOSAMINE 1500 COMPLEX PO)   Oral   Take 1 capsule by mouth daily.         Marland Kitchen HYDROcodone-acetaminophen (NORCO/VICODIN) 5-325 MG per tablet   Oral   Take 1-2 tablets by mouth every 6 (six)  hours as needed. pain   40 tablet   0   . ondansetron (ZOFRAN) 8 MG tablet      Take one tab 30 minutes to 1 hour prior to radiation treatment.         . prochlorperazine (COMPAZINE) 10 MG tablet   Oral   Take 10 mg by mouth every 6 (six) hours as needed (nausea).         . triamterene-hydrochlorothiazide (MAXZIDE-25) 37.5-25 MG per tablet   Oral   Take 1 tablet by mouth daily.         Marland Kitchen atenolol (TENORMIN) 50 MG tablet   Oral   Take 50 mg by mouth daily.         Marland Kitchen HYDROcodone-acetaminophen (NORCO) 10-325 MG per tablet   Oral   Take 1 tablet by mouth every 6 (six) hours as needed for pain.   20 tablet   0     BP 139/93  Pulse 82  Temp(Src) 98 F (36.7 C) (Oral)  Resp 18  SpO2 91%  Physical Exam  Nursing note and vitals reviewed. Constitutional: She is oriented to person, place, and time. She appears well-developed and well-nourished.  HENT:  Head: Normocephalic and atraumatic.  Eyes: EOM are normal. Pupils are equal, round, and reactive to light.  Neck: Normal range of motion. Neck supple.  Cardiovascular: Normal rate, regular rhythm and normal heart sounds.   No murmur heard. Pulmonary/Chest: Effort normal and breath sounds normal. No respiratory distress. She has no wheezes. She has no rales.  Abdominal: Soft. Bowel sounds are normal. She exhibits no distension. There is tenderness. There is no rebound and no guarding.  Moderate upper abdominal tenderness without rebound or guarding.   Musculoskeletal: Normal range of motion.  Neurological: She is alert and oriented to person, place, and time. No cranial nerve deficit.  Skin: Skin is warm and dry.  Psychiatric: She has a normal mood and affect. Her speech is normal.    ED Course  Procedures (including critical care time)  Labs Reviewed  CBC WITH DIFFERENTIAL - Abnormal; Notable for the following:    Hemoglobin 11.6 (*)    HCT 34.0 (*)    Lymphocytes Relative 10 (*)    Lymphs Abs 0.5 (*)     Eosinophils Relative 7 (*)    All other components within normal limits  COMPREHENSIVE METABOLIC PANEL - Abnormal; Notable for the following:    Potassium 3.1 (*)    Glucose, Bld 121 (*)    Albumin 3.3 (*)    All other components within normal limits  LIPASE, BLOOD  URINALYSIS, ROUTINE W REFLEX MICROSCOPIC  CG4 I-STAT (LACTIC ACID)   Ct Abdomen Pelvis W Contrast  11/24/2012  *RADIOLOGY REPORT*  Clinical Data: Abdominal pain.  History of pancreatic cancer.  CT ABDOMEN AND PELVIS WITH CONTRAST  Technique:  Multidetector CT imaging of the abdomen and pelvis was performed following the standard protocol during bolus administration of intravenous contrast.  Contrast: 50mL OMNIPAQUE IOHEXOL 300 MG/ML  SOLN, OMNIPAQUE IOHEXOL 300 MG/ML  SOLN  Comparison: 09/14/2012.  Findings: The lung bases are clear.  No pulmonary nodules or pleural effusion.  The liver demonstrates a stable low attenuation lesion in the right hepatic lobe.  This is likely a complex cyst.  No new liver lesions to suggest metastatic disease.  No biliary dilatation.  The gallbladder is normal.  The pancreatic head mass is slightly smaller measuring approximately 23 x 18 mm.  It previously measured 31 x 27 mm.  Mild stable pancreatic ductal dilatation and atrophy of the body and tail.  No enlarged peripancreatic lymph nodes are identified.  The spleen is normal in size.  No focal lesions.  The adrenal glands and kidneys are normal and stable.  There is fairly marked wall thickening involving the antral region of the stomach.  No discrete ulcers identified but these findings may suggest gastritis.  This could account the patient's abdominal pain.  The duodenal bulb, C-loop, small bowel and colon are unremarkable. No inflammatory changes or mass lesions.  No obstruction.  The uterus is surgically absent.  The bladder is normal.  No pelvic mass or adenopathy.  No free pelvic fluid collections.  No inguinal mass or hernia.  The bony structures  are intact and appears stable. 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  No destructive bony lesions.  Stable cystic change in the left acetabulum.  IMPRESSION:  1.  Interval decrease in size of the pancreatic head mass.  No findings to suggest metastatic disease. 2.  Stable low attenuation lesion in the right hepatic lobe which is likely a complex cyst. 3.  Fairly marked antral wall thickening of the stomach with surrounding inflammatory change suggesting peptic ulcer disease or gastritis.  No obvious discrete ulcer. 4.  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   Original Report Authenticated By: Rudie Meyer, M.D.    Dg Abd Acute W/chest  11/24/2012  *RADIOLOGY REPORT*  Clinical Data: Abdominal pain, nausea, weakness  ACUTE ABDOMEN SERIES (ABDOMEN 2 VIEW & CHEST 1 VIEW)  Comparison: 10/06/2012, 04/24/2012  Findings: Stable mild cardiomegaly but normal vascularity. Negative for CHF or definite pneumonia.  No effusion or pneumothorax.  Atherosclerotic changes of the aorta.  Mild scoliosis evident.  No significant interval change.  No free air evident.  Scattered air and stool throughout the bowel. Negative for obstruction or ileus.  No acute osseous finding.  IMPRESSION: No acute finding by plain radiography   Original Report Authenticated By: Judie Petit. Shick, M.D.      1. Abdominal pain   2. Gastric ulcer       MDM  Patient with abdominal pain. Possible low-grade fevers. CT was done due to to known cancer and she was likely ulcer disease or gastritis. Urinalysis and added and is pending at this time. Patient be likely discharged home  Care turned over to Dr. Manus Gunning.       Juliet Rude. Rubin Payor, MD 11/24/12 [phone]

## 2012-11-24 NOTE — ED Notes (Signed)
[id number]> Expected date:<BR> Expected time:<BR> Means of arrival:<BR> Comments:<BR> Hold triage 2

## 2012-11-24 NOTE — Progress Notes (Signed)
RECEIVED A FAX FROM [facility] CONCERNING A CONFIRMATION OF PRESCRIPTION SHIPMENT FOR CAPECITABINE [XELODA] ON 11/23/12.

## 2012-11-24 NOTE — ED Notes (Signed)
Pt has pancreatic cancer and her MD is Dr Mitzi Hansen,  She was advised to come for possible acute abdomin,  Pt is a lot of pain and she is currently receiving chemo and radiation

## 2012-11-25 ENCOUNTER — Other Ambulatory Visit: Payer: Self-pay | Admitting: *Deleted

## 2012-11-25 ENCOUNTER — Ambulatory Visit: Payer: Medicare Other

## 2012-11-25 ENCOUNTER — Ambulatory Visit
Admission: RE | Admit: 2012-11-25 | Discharge: 2012-11-25 | Disposition: A | Discharge status: Home / Self Care | Payer: Medicare Other | Source: Ambulatory Visit | Attending: Radiation Oncology | Admitting: Radiation Oncology

## 2012-11-25 MED ORDER — HYDROMORPHONE HCL 4 MG PO TABS: 4.0000 mg | ORAL_TABLET | ORAL | Status: DC | PRN

## 2012-11-25 NOTE — Progress Notes (Signed)
Patient went to emergency department last night with increased abdominal pain.Ct of abdomen and acute abdomen with chest performed reveal  Ulcer.Given new script for dilaudid and discharge instructions to hold aspirin and avoid foods to irritate lining of stomach.Chart to be left with Dr.Moody for review.I will call patient this evening after she gets rest and let her know what th physician says in regard to continuing radiation or holding treatment.

## 2012-11-25 NOTE — Telephone Encounter (Signed)
Called and spoke to patient regarding refill request for Xeloda, she just rec'd shipment on 3/24 to complete this regimen with her daily RT treatments. Faxed back to [facility] to discontinue this current order.

## 2012-11-26 ENCOUNTER — Ambulatory Visit (HOSPITAL_COMMUNITY)
Admission: RE | Admit: 2012-11-26 | Discharge: 2012-11-26 | Disposition: A | Discharge status: Home / Self Care | Payer: Medicare Other | Source: Ambulatory Visit | Attending: Oncology | Admitting: Oncology

## 2012-11-26 ENCOUNTER — Ambulatory Visit (HOSPITAL_BASED_OUTPATIENT_CLINIC_OR_DEPARTMENT_OTHER): Payer: Medicare Other | Admitting: Nurse Practitioner

## 2012-11-26 ENCOUNTER — Ambulatory Visit: Payer: Medicare Other

## 2012-11-26 ENCOUNTER — Other Ambulatory Visit: Payer: Self-pay | Admitting: *Deleted

## 2012-11-26 ENCOUNTER — Other Ambulatory Visit (HOSPITAL_BASED_OUTPATIENT_CLINIC_OR_DEPARTMENT_OTHER): Payer: Medicare Other | Admitting: Lab

## 2012-11-26 ENCOUNTER — Telehealth: Payer: Self-pay | Admitting: Oncology

## 2012-11-26 ENCOUNTER — Other Ambulatory Visit (HOSPITAL_COMMUNITY): Payer: Self-pay | Admitting: Oncology

## 2012-11-26 ENCOUNTER — Ambulatory Visit
Admission: RE | Admit: 2012-11-26 | Discharge: 2012-11-26 | Disposition: A | Discharge status: Home / Self Care | Payer: Medicare Other | Source: Ambulatory Visit | Attending: Radiation Oncology | Admitting: Radiation Oncology

## 2012-11-26 VITALS — BP 189/90 | HR 62 | Temp 99.2°F | Resp 20 | Ht 64.0 in | Wt 165.8 lb

## 2012-11-26 DIAGNOSIS — C259 Malignant neoplasm of pancreas, unspecified: Secondary | ICD-10-CM

## 2012-11-26 DIAGNOSIS — C801 Malignant (primary) neoplasm, unspecified: Secondary | ICD-10-CM | POA: Insufficient documentation

## 2012-11-26 DIAGNOSIS — I82409 Acute embolism and thrombosis of unspecified deep veins of unspecified lower extremity: Secondary | ICD-10-CM

## 2012-11-26 DIAGNOSIS — I824Y9 Acute embolism and thrombosis of unspecified deep veins of unspecified proximal lower extremity: Secondary | ICD-10-CM | POA: Insufficient documentation

## 2012-11-26 DIAGNOSIS — C250 Malignant neoplasm of head of pancreas: Secondary | ICD-10-CM

## 2012-11-26 DIAGNOSIS — M25561 Pain in right knee: Secondary | ICD-10-CM

## 2012-11-26 DIAGNOSIS — R109 Unspecified abdominal pain: Secondary | ICD-10-CM

## 2012-11-26 DIAGNOSIS — R21 Rash and other nonspecific skin eruption: Secondary | ICD-10-CM

## 2012-11-26 DIAGNOSIS — R609 Edema, unspecified: Secondary | ICD-10-CM

## 2012-11-26 LAB — CBC WITH DIFFERENTIAL/PLATELET
BASO%: 0.1 % (ref 0.0–2.0)
Basophils Absolute: 0 10*3/uL (ref 0.0–0.1)
HCT: 34.6 % — ABNORMAL LOW (ref 34.8–46.6)
HGB: 11.7 g/dL (ref 11.6–15.9)
MCHC: 33.8 g/dL (ref 31.5–36.0)
MONO#: 0.6 10*3/uL (ref 0.1–0.9)
NEUT#: 3.6 10*3/uL (ref 1.5–6.5)
NEUT%: 78.1 % — ABNORMAL HIGH (ref 38.4–76.8)
WBC: 4.6 10*3/uL (ref 3.9–10.3)
lymph#: 0.1 10*3/uL — ABNORMAL LOW (ref 0.9–3.3)

## 2012-11-26 MED ORDER — PANTOPRAZOLE SODIUM 40 MG PO TBEC: 40.0000 mg | DELAYED_RELEASE_TABLET | Freq: Every day | ORAL | Status: DC

## 2012-11-26 MED ORDER — ENOXAPARIN SODIUM 120 MG/0.8ML ~~LOC~~ SOLN: 110.0000 mg | Freq: Every day | SUBCUTANEOUS | Status: DC

## 2012-11-26 MED ORDER — ENOXAPARIN SODIUM 120 MG/0.8ML ~~LOC~~ SOLN
110.0000 mg | Freq: Once | SUBCUTANEOUS | Status: AC
  Administered 2012-11-26: 110 mg via SUBCUTANEOUS
  Filled 2012-11-26: qty 0.8

## 2012-11-26 MED ORDER — OXYCODONE HCL 5 MG PO TABS: 5.0000 mg | ORAL_TABLET | ORAL | Status: DC | PRN

## 2012-11-26 NOTE — Telephone Encounter (Signed)
Pt requested to have Lovenox prescription changed to [facility]. Same done.

## 2012-11-26 NOTE — Telephone Encounter (Signed)
Lovenox injection teaching done with pt, daughter and granddaughter. Granddaughter, Donavan Foil returned demonstration. Daughter will need reinforcement of teaching. Reviewed with Lanora Manis in managed care, Lovenox does not require pre-cert. They understand to call office with any questions.

## 2012-11-26 NOTE — Progress Notes (Signed)
*  PRELIMINARY RESULTS* Vascular Ultrasound Lower extremity venous duplex has been completed.  Preliminary findings: Right = Extensive DVT involving the common femoral, femoral, popliteal, peroneal and posterior tibial veins. Left = no evidence of DVT.  Farrel Demark, RDMS, RVT  11/26/2012, 2:14 PM

## 2012-11-26 NOTE — Addendum Note (Signed)
Addended by: Caleb Popp on: 11/26/2012 03:04 PM   Modules accepted: Orders

## 2012-11-26 NOTE — Progress Notes (Signed)
OFFICE PROGRESS NOTE  Interval history:  Sheila Hurst returns as scheduled. She continues radiation and Xeloda.  She was seen in the emergency Department on 11/24/2012 for evaluation of abdominal pain, nausea and diarrhea. CT abdomen/pelvis showed interval decrease in the size of the pancreatic head mass; no findings to suggest metastatic disease; a stable low attenuation lesion in the right hepatic lobe felt to likely be a complex cyst; fairly marked antral wall thickening of the stomach with surrounding inflammatory change suggestive of peptic ulcer disease or gastritis. There was no obvious discrete ulcer.  She has been taking Dilaudid for the abdominal pain. Dilaudid relieves the pain but causes her to "woozy". She has noted a pruritic rash on her back and abdomen. She woke up this morning she noted that the right leg was swollen and uncomfortable. She has had mild dyspnea for the past several days. No chest pain. No fever. No cough.   Objective: Blood pressure 189/90, pulse 62, temperature 99.2 F (37.3 C), temperature source Oral, resp. rate 20, height 5\' 4"  (1.626 m), weight 165 lb 12.8 oz (75.206 kg).  Fine rales at the left lung base. No respiratory distress. Regular cardiac rhythm. Abdomen soft. No hepatomegaly. Tender over the epigastric region. Trace edema of the entire right leg. Several raised 2-4 millimeter skin lesions at the posterior axillary/lateral back regions.  Lab Results: Lab Results  Component Value Date   WBC 4.6 11/26/2012   HGB 11.7 11/26/2012   HCT 34.6* 11/26/2012   MCV 86.8 11/26/2012   PLT 102* 11/26/2012    Chemistry:    Chemistry      Component Value Date/Time   NA 138 11/24/2012 2045   NA 142 10/05/2012 1234   K 3.1* 11/24/2012 2045   K 3.2* 10/05/2012 1234   CL 99 11/24/2012 2045   CL 104 10/05/2012 1234   CO2 30 11/24/2012 2045   CO2 27 10/05/2012 1234   BUN 8 11/24/2012 2045   BUN 19.0 10/05/2012 1234   CREATININE 0.57 11/24/2012 2045   CREATININE 0.8  10/05/2012 1234      Component Value Date/Time   CALCIUM 9.1 11/24/2012 2045   CALCIUM 9.2 10/05/2012 1234   ALKPHOS 91 11/24/2012 2045   ALKPHOS 119 10/05/2012 1234   AST 26 11/24/2012 2045   AST 16 10/05/2012 1234   ALT 19 11/24/2012 2045   ALT 14 10/05/2012 1234   BILITOT 0.5 11/24/2012 2045   BILITOT 0.30 10/05/2012 1234       Studies/Results: Ct Abdomen Pelvis W Contrast  11/24/2012  *RADIOLOGY REPORT*  Clinical Data: Abdominal pain.  History of pancreatic cancer.  CT ABDOMEN AND PELVIS WITH CONTRAST  Technique:  Multidetector CT imaging of the abdomen and pelvis was performed following the standard protocol during bolus administration of intravenous contrast.  Contrast: 50mL OMNIPAQUE IOHEXOL 300 MG/ML  SOLN, OMNIPAQUE IOHEXOL 300 MG/ML  SOLN  Comparison: 09/14/2012.  Findings: The lung bases are clear.  No pulmonary nodules or pleural effusion.  The liver demonstrates a stable low attenuation lesion in the right hepatic lobe.  This is likely a complex cyst.  No new liver lesions to suggest metastatic disease.  No biliary dilatation.  The gallbladder is normal.  The pancreatic head mass is slightly smaller measuring approximately 23 x 18 mm.  It previously measured 31 x 27 mm.  Mild stable pancreatic ductal dilatation and atrophy of the body and tail.  No enlarged peripancreatic lymph nodes are identified.  The spleen is normal in  size.  No focal lesions.  The adrenal glands and kidneys are normal and stable.  There is fairly marked wall thickening involving the antral region of the stomach.  No discrete ulcers identified but these findings may suggest gastritis.  This could account the patient's abdominal pain.  The duodenal bulb, C-loop, small bowel and colon are unremarkable. No inflammatory changes or mass lesions.  No obstruction.  The uterus is surgically absent.  The bladder is normal.  No pelvic mass or adenopathy.  No free pelvic fluid collections.  No inguinal mass or hernia.  The bony  structures are intact and appears stable. 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  No destructive bony lesions.  Stable cystic change in the left acetabulum.  IMPRESSION:  1.  Interval decrease in size of the pancreatic head mass.  No findings to suggest metastatic disease. 2.  Stable low attenuation lesion in the right hepatic lobe which is likely a complex cyst. 3.  Fairly marked antral wall thickening of the stomach with surrounding inflammatory change suggesting peptic ulcer disease or gastritis.  No obvious discrete ulcer. 4.  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   Original Report Authenticated By: Rudie Meyer, M.D.    Dg Abd Acute W/chest  11/24/2012  *RADIOLOGY REPORT*  Clinical Data: Abdominal pain, nausea, weakness  ACUTE ABDOMEN SERIES (ABDOMEN 2 VIEW & CHEST 1 VIEW)  Comparison: 10/06/2012, 04/24/2012  Findings: Stable mild cardiomegaly but normal vascularity. Negative for CHF or definite pneumonia.  No effusion or pneumothorax.  Atherosclerotic changes of the aorta.  Mild scoliosis evident.  No significant interval change.  No free air evident.  Scattered air and stool throughout the bowel. Negative for obstruction or ileus.  No acute osseous finding.  IMPRESSION: No acute finding by plain radiography   Original Report Authenticated By: Judie Petit. Miles Costain, M.D.     Medications: I have reviewed the patient's current medications.  Assessment/Plan:  1. 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 Elevated CA 19-9. She began radiation and concurrent Xeloda chemotherapy on 10/19/2012. 2. Pain secondary to pancreas cancer. Improved. 3. Right liver cyst. Smaller on MRI 09/28/2012 compared to 11/03/2008. 4. Hypertension currently taking Cardizem and  Maxide. 5. Hyperlipidemia. 6. Family history of pancreas cancer (paternal aunt). 7. Anorexia/weight loss. 8. Bradycardia when here on 11/11/2012. Atenolol was placed on hold. 9. Abdominal pain with CT scan 11/24/2012 showing decrease in the size of the pancreatic head mass and fairly marked antral wall thickening of the stomach with surrounding inflammatory change. She is taking Dilaudid. Due to the skin rash and "woozy" sensation after taking Dilaudid we have instructed her to discontinue this. She was given a new prescription for oxycodone 5 mg 1-2 tablets every 4 hours as needed. We are also starting her on Protonix 40 mg daily. 10. New onset right leg edema. She is being referred for a venous Doppler. 11. Skin rash of unclear etiology. Question medication related. She was instructed to discontinue Dilaudid as noted above and also to discontinue Xeloda.  Disposition-Ms. Stefano will complete the course of radiation therapy 11/27/2012. We have instructed her to discontinue Xeloda at this time. The etiology of the abdominal pain is unclear. She will begin Protonix for possible gastritis and will try oxycodone as needed in place of Dilaudid. We  will followup on the Doppler study of the right leg. If a DVT is confirmed she will return to the office to initiate anticoagulation. She will return for a followup visit on 12/07/2012.  Patient seen with Dr. Truett Perna.  Sheila Hurst ANP/GNP-BC

## 2012-11-27 ENCOUNTER — Ambulatory Visit: Payer: Medicare Other

## 2012-11-27 ENCOUNTER — Ambulatory Visit
Admission: RE | Admit: 2012-11-27 | Discharge: 2012-11-27 | Disposition: A | Discharge status: Home / Self Care | Payer: Medicare Other | Source: Ambulatory Visit | Attending: Radiation Oncology | Admitting: Radiation Oncology

## 2012-11-27 VITALS — BP 140/67 | HR 58 | Temp 97.8°F | Wt 163.6 lb

## 2012-11-27 DIAGNOSIS — C250 Malignant neoplasm of head of pancreas: Secondary | ICD-10-CM

## 2012-11-27 NOTE — Progress Notes (Signed)
Patient here for routine weekly assessment for radiation to abdomen.Xeloda has been stopped.Developed dvt in right leg and is now taking levonox.Aspirin stopped after diagnosis of ulcer.Pain now well controlled with oxy ir.

## 2012-11-27 NOTE — Progress Notes (Signed)
   Department of Radiation Oncology  Phone:  [phone] Fax:        [phone]  Weekly Treatment Note    Name: Sheila Hurst Date: 11/27/2012 MRN: [id number] DOB: [birth year]   Current dose: 48.6 Gy  Current fraction: 27   MEDICATIONS: Current Outpatient Prescriptions  Medication Sig Dispense Refill  . diltiazem (CARDIZEM) 90 MG tablet Take 90 mg by mouth 2 (two) times daily.      Marland Kitchen enoxaparin (LOVENOX) 120 MG/0.8ML injection Inject 0.73 mLs (110 mg total) into the skin daily.  30 Syringe  6  . oxyCODONE (OXY IR/ROXICODONE) 5 MG immediate release tablet Take 1-2 tablets (5-10 mg total) by mouth every 4 (four) hours as needed for pain.  50 tablet  0  . pantoprazole (PROTONIX) 40 MG tablet Take 1 tablet (40 mg total) by mouth daily.  30 tablet  2  . prochlorperazine (COMPAZINE) 10 MG tablet Take 10 mg by mouth every 6 (six) hours as needed (nausea).      . triamterene-hydrochlorothiazide (MAXZIDE-25) 37.5-25 MG per tablet Take 1 tablet by mouth daily.      Marland Kitchen atenolol (TENORMIN) 50 MG tablet Take 50 mg by mouth daily.      . capecitabine (XELODA) 500 MG tablet Take 3 tablets (1,500 mg total) by mouth 2 (two) times daily after a meal. M-F on days of radiation only Total daily dose 3000 mg  48 tablet  0  . Glucosamine-Chondroit-Vit C-Mn (GLUCOSAMINE 1500 COMPLEX PO) Take 1 capsule by mouth daily.      Marland Kitchen HYDROmorphone (DILAUDID) 4 MG tablet Take 1 tablet (4 mg total) by mouth every 4 (four) hours as needed for pain.  10 tablet  0  . ondansetron (ZOFRAN) 8 MG tablet Take one tab 30 minutes to 1 hour prior to radiation treatment.       No current facility-administered medications for this encounter.     ALLERGIES: Ondansetron   LABORATORY DATA:  Lab Results  Component Value Date   WBC 4.6 11/26/2012   HGB 11.7 11/26/2012   HCT 34.6* 11/26/2012   MCV 86.8 11/26/2012   PLT 102* 11/26/2012   Lab Results  Component Value Date   NA 138 11/24/2012   K 3.1* 11/24/2012   CL 99  11/24/2012   CO2 30 11/24/2012   Lab Results  Component Value Date   ALT 19 11/24/2012   AST 26 11/24/2012   ALKPHOS 91 11/24/2012   BILITOT 0.5 11/24/2012     NARRATIVE: Sheila Hurst was seen today for weekly treatment management. The chart was checked and the patient's films were reviewed. The patient is doing well she states overall. The patient did have a lower extremity vascular ultrasound which showed a right-sided DVT. She has begun Lovenox. She has had some occasional nausea but this has not been uncontrolled on her current medications.  PHYSICAL EXAMINATION: weight is 163 lb 9.6 oz (74.208 kg). Her temperature is 97.8 F (36.6 C). Her blood pressure is 140/67 and her pulse is 58. Her oxygen saturation is 97%.        ASSESSMENT: The patient is doing satisfactorily with treatment.  PLAN: We will continue with the patient's radiation treatment as planned. The patient will finish her final fraction of radiotherapy on Monday and I will see her back in clinic in one month.

## 2012-11-30 ENCOUNTER — Ambulatory Visit
Admission: RE | Admit: 2012-11-30 | Discharge: 2012-11-30 | Disposition: A | Discharge status: Home / Self Care | Payer: Medicare Other | Source: Ambulatory Visit | Attending: Radiation Oncology | Admitting: Radiation Oncology

## 2012-11-30 ENCOUNTER — Encounter: Payer: Self-pay | Admitting: Radiation Oncology

## 2012-12-02 ENCOUNTER — Ambulatory Visit: Payer: Medicare Other | Admitting: Nutrition

## 2012-12-02 NOTE — Progress Notes (Signed)
I called patient on the telephone for nutrition followup. Patient reports that she is experiencing fatigue. She is pleased that she has completed her radiation therapy. She states she does have some nausea however she is trying to eat small amounts of food throughout the day. She tries to drink one oral nutrition supplement daily. Her weight has decreased to 163.6 pounds March 28 from 171.7 pounds February 21. Patient is aware of weight loss and reports her family states they are getting ready to "fatten her up."  Nutrition diagnosis: Food and nutrition related knowledge deficit continues but has improved.  Intervention: I educated patient on the importance of snacking in between her small meals. I've reviewed appropriate snacks with her. I've encouraged patient to continue a minimum of one oral nutrition supplement daily. Teach back method used.  Monitoring, evaluation, goals: Patient will increase oral intake to minimize further weight loss.  Next visit: Patient reports she will contact me if she has further questions or concerns or if she has continued weight loss.

## 2012-12-04 ENCOUNTER — Telehealth: Payer: Self-pay | Admitting: Dietician

## 2012-12-07 ENCOUNTER — Ambulatory Visit (HOSPITAL_BASED_OUTPATIENT_CLINIC_OR_DEPARTMENT_OTHER): Payer: Medicare Other | Admitting: Oncology

## 2012-12-07 ENCOUNTER — Ambulatory Visit (INDEPENDENT_AMBULATORY_CARE_PROVIDER_SITE_OTHER): Payer: Medicare Other | Admitting: General Surgery

## 2012-12-07 VITALS — BP 128/77 | HR 78 | Temp 97.6°F | Resp 18 | Ht 64.0 in | Wt 157.0 lb

## 2012-12-07 DIAGNOSIS — C250 Malignant neoplasm of head of pancreas: Secondary | ICD-10-CM

## 2012-12-07 DIAGNOSIS — C259 Malignant neoplasm of pancreas, unspecified: Secondary | ICD-10-CM

## 2012-12-07 DIAGNOSIS — Z800 Family history of malignant neoplasm of digestive organs: Secondary | ICD-10-CM

## 2012-12-07 DIAGNOSIS — K7689 Other specified diseases of liver: Secondary | ICD-10-CM

## 2012-12-07 DIAGNOSIS — I82409 Acute embolism and thrombosis of unspecified deep veins of unspecified lower extremity: Secondary | ICD-10-CM

## 2012-12-07 NOTE — Patient Instructions (Addendum)
Eat protein!!! Get exercise!!!  Follow up CT scan mid may  Surgery to be late may or early June.  WHIPPLE (PANCREATICODUODENECTOMY) THE OPERATION: The goal of the operation is to remove masses in the head of the pancreas, the distal bile duct, or the duodenum.  The structures that are removed include the head of the pancreas, the duodenum, the gallbladder, and a small portion of your stomach.  The jejunum, or middle part of the small intestine, is then used to reconnect the bile duct, the pancreas, and the stomach to the intestinal tract.  The reason so much needs to be removed is that the blood supply and lymphatic channels and nodes are closely interconnected.  The operation takes between 4 and 8 hours depending on the amount of scar tissue you have present from prior surgeries, or from the mass.    WHAT HAPPENS IN THE OPERATING ROOM: I will start the operation with a diagnostic laparoscopy.  This means we will make small incisions to see if there is visible cancer outside of the pancreas.  If there is visible spread of cancer, I will stop the operation because research has shown that survival for pancreatic cancer is worse if you undergo a big operation without being able to remove all the cancer.  If no other cancer is seen, I will make a bigger incision on your abdomen and begin mobilizing the stomach, duodenum and gallbladder to see if the tumor is able to be removed.  Sometimes the tumor is too adherent to the arteries and veins supplying the liver and small intestine that it is unsafe or not possible to remove the tumor.  If this is the case, the procedure would be stopped and the tumor would be left in place.  Whatever the operative findings, I will discuss the case with your family after we are done in the operating room.  I will talk to you in the next few days when you are more awake.  I will see you in the hospital every weekday that I am not out of town.  My partners help see patients  on the weekends and if I am out of town.    WHAT HAPPENS AFTER SURGERY: After surgery, you will go first to the recovery room, then to the ICU for careful monitoring.  It is important that I am able to know your vital signs and urine output to see how you are doing.  Depending on many factors, you may be in the ICU overnight, or for several days.  You will have many tubes and lines in place which is standard for this operation.  You will have several IVs, including possibly a central line in your chest or neck.  You will likely have an arterial line in your wrist.  You will have a tube in your nose for 4-5 days in order to suction out the stomach and liver secretions to help the surgical connections heal.  YOU WILL NOT BE ABLE TO EAT FOR AT LEAST 4-5 DAYS AFTER SURGERY.  You will have a catheter in your bladder.  On your abdomen, you will have several surgical drains and possibly a pain pump with numbing medicine.  You will have compression stockings on your legs to decrease the risk of blood clots.    Sometimes anesthesia makes a decision that you may need to be left on the breathing machine for a period after surgery.  This may be based on your overall health status, or events  during surgery.    We will address your pain in several ways.  We will use an IV pain pump called a "PCA," or Patient Controlled Analgesia.  This allows you to press a button and immediately receive a dose of pain medication without waiting for a nurse.  We also use IV Tylenol and sometimes IV Toradol which is similar to ibuprofen.  You may also have a pump with numbing medicine delivered directly to your incision.  I use doses and medications that work for the majority of people, but you may need an adjustment to the dose or type of medicine if your pain is not adequately controlled.  Your throat may be sore, in which case you may need a throat spray or lozenges.    We will ask you to get out of bed the day after surgery in order  to maximize your chances of not having complications.  Your risk of pneumonia and blood clots is lower with walking and sitting in the chair.  We will also ask you to perform breathing exercises.  We will also ask to you walk in your room and in the halls for the above reasons, but also in order for you to keep up your strength.    EATING: We will usually start you on clear liquids in around 5 days if your bowel function seems to have returned.  We advance your diet slowly to make sure you are tolerating each step.  All patients do not have a normal appetite when they go home and usually have to take 2-4 cans of nutritional supplement per day while this is improving.  Most patients also find that their taste buds do not seem the same right after surgery, and this can continue into the time of possible post operative chemotherapy and radiation.  Some patients develop diabetes and will need assistance from a primary care doctor for medication.    OTHER TREATMENT? I will present your case when the pathology is available at our GI Multidisciplinary conference which occurs every 1-2 weeks.  Medical and Radiation Oncologists are present, as well as the pathologist and radiologist in addition to myself.  If you have a larger tumor or if you have positive lymph nodes, we may have the oncologist stop by to see you while you are in the hospital.  Most firm post op treatment plans occur, however, once you are an outpatient because we will need to see how you are recovering from surgery.  GOING HOME! Usually you are able to go home in 8-14 days, depending on whether or not complications happen and what is going on with your overall health status.   If you have more health problems or if you have limited help at home, the therapists and nurses may recommend a temporary rehab or nursing facility to help you get back on your feet before you go home.  These decisions would be made while you are in the hospital with the  assistance of a social worker or case manager.    Please bring all insurance/disability forms to our office for the staff to fill out   POSSIBLE COMPLICATIONS This is a very extensive operation and includes complications listed below: Bleeding Infection and possible wound complications such as hernia Damage to adjacent structures Leak of surgical connections, the worst of which is the connection between the intestine and the pancreas (20%) Possible need for other procedures, such as abscess drains in radiology or endoscopy.   Possible prolonged hospital  stay Possible development of diabetes or worsening of current diabetes. (5-20%) Possible diarrhea from lack of pancreatic enzymes.   Prolonged fatigue/weakness/appetite MOST PATIENTS' ENERGY LEVEL IS NOT BACK TO NORMAL FOR AT LEAST 4-6 MONTHS.  OLDER PATIENTS MAY FEEL WEAK FOR LONGER PERIODS OF TIME.   Difficulty with eating or post operative nausea (around 30%) Possible early recurrence of cancer Possible complications of your medical problems such as heart disease or arrhythmias. Death (less than 2%)  All possible complications are not listed, just the most common.    FURTHER INFORMATION? Please ask questions if you find something that we did not discuss in the office and would like more information.  If you would like another appointment if you have many questions or if your family members would like to come as well, please contact the office.     IF YOU ARE TAKING ASPIRIN, PLAVIX, COUMADIN, OR OTHER BLOOD THINNERS, LET us KNOW IMMEDIATELY SO WE CAN CONTACT YOUR PRESCRIBING HEALTH CARE PROVIDER TO HOLD THE MEDICATION FOR 5-7 DAYS BEFORE SURGERY

## 2012-12-07 NOTE — Progress Notes (Signed)
   [facility]    OFFICE PROGRESS NOTE   INTERVAL HISTORY:   She returns as scheduled. She complains of a poor appetite and a loss of taste for food. Her daughter reports she is pocketing food without swallowing and been spitting up the now. The leg has improved with anticoagulation therapy.  Objective:  Vital signs in last 24 hours:  Blood pressure 128/77, pulse 78, temperature 97.6 F (36.4 C), temperature source Oral, resp. rate 18, height 5\' 4"  (1.626 m), weight 157 lb (71.215 kg).    HEENT: No thrush. Resp: Lungs clear bilaterally Cardio: Regular rate and rhythm GI: Mild tenderness in the mid upper abdomen. No hepatosplenomegaly, no mass Vascular: No leg edema  Skin: Fading rash at the bilateral axillary and abdominal wall.     Medications: I have reviewed the patient's current medications.  Assessment/Plan: 1. 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 Elevated CA 19-9. She began radiation and concurrent Xeloda chemotherapy on 10/19/2012, radiation completed 11/30/2012 2. Pain secondary to pancreas cancer. Improved. 3. Right liver cyst. Smaller on MRI 09/28/2012 compared to 11/03/2008. 4. Hypertension currently taking Cardizem and Maxide. 5. Hyperlipidemia. 6. Family history of pancreas cancer (paternal aunt). 7. Anorexia/weight loss. 8. Bradycardia when here on 11/11/2012. Atenolol was placed on hold. 9. Abdominal pain with CT scan 11/24/2012 showing decrease in the size of the pancreatic head mass and fairly marked antral wall thickening of the stomach with surrounding inflammatory change. She is taking Dilaudid. Due to the skin rash and "woozy" sensation after taking Dilaudid the Dilaudid was discontinued.Marland Kitchen She was given a new prescription for oxycodone 5 mg 1-2 tablets every 4  hours as needed and she was started on Protonix 40 mg daily. 10. Right leg DVT documented on a Doppler 11/26/2012-now on Lovenox. 11. Skin rash of unclear etiology. Question medication related. She was instructed to discontinue Dilaudid as noted above and also to discontinue Xeloda. The rash has almost completely resolved  Disposition:  Ms. Walsworth has completed neoadjuvant therapy. I suspect her symptoms are in part related to the abdominal radiation and underlying malignancy. She will meet with the cancer Center nutritionist this week. We will check a CBC today to followup on the mild thrombocytopenia noted 11/26/2012.  She will see Dr. Donell Beers later today to discuss the indication for surgery. She will return for an office visit here in approximately 2 weeks.     Thornton Papas, MD  12/07/2012  6:21 PM

## 2012-12-08 ENCOUNTER — Telehealth: Payer: Self-pay | Admitting: Oncology

## 2012-12-08 NOTE — Telephone Encounter (Signed)
Left VM asking patient if she had decided if she wants to try the Megace 40 mg/cc  5 cc bid for her appetite as was discussed with Dr. Truett Perna at visit? Requested she call and let us know.

## 2012-12-08 NOTE — Telephone Encounter (Signed)
S/W THE PT AND SHE IS AWARE OF HER APPT TO SEE THE NUTRITIONIST ALONG WITH THE MD IN April.

## 2012-12-08 NOTE — Telephone Encounter (Signed)
Pt wants to come 12/11/12 @ 10 am

## 2012-12-08 NOTE — Assessment & Plan Note (Signed)
Patient has completed her neoadjuvant treatment. We will plan to do her surgery in approximately 6-8 weeks after completion of treatment. I will repeat her CT scan to make sure that she has no growth of her tumor and no increased encroachment onto the vessels.  She'll get a repeat tumor marker as well. I reviewed the surgical plan for a diagnostic laparoscopy followed by a Whipple if there is no evidence of metastatic disease.  I will need to see her back within 30 days of surgery. We reviewed the risks and benefits of surgery again.    I discussed the surgery with the patient including diagrams of anatomy.  I discussed the potential for diagnostic laparoscopy.  In the case of pancreatic cancer, if spread of the disease is found, we will abort the procedure and not proceed with resection.  The rationale for this was discussed with the patient.  There has not been data to support resection of Stage IV disease in terms of survival benefit.    We discussed possible complications including: Potential of aborting procedure if tumor is invading the superior mesenteric or hepatic arteries Bleeding Infection and possible wound complications such as hernia Damage to adjacent structures Leak of anastamoses, primarily pancreatic Possible need for other procedures Possible prolonged hospital stay Possible development of diabetes or worsening of current diabetes.  Possible pancreatic exocrine insufficiency Prolonged fatigue/weakness/appetite Difficulty with eating or post operative nausea Possible early recurrence of cancer   The patient understands and wishes to proceed.  The patient has been advised to turn in disability paperwork to our office.

## 2012-12-08 NOTE — Progress Notes (Signed)
HISTORY: Patient is a 72 year old female diagnosed with pancreatic cancer earlier this year. She has undergone neoadjuvant treatment due to abutment of the mesenteric vessels.  She had some issues with treatment such as difficulty with taste and appetite. She has had some continued weight loss. She denies nausea and vomiting. She has been doing some light yard work.  She has not had any true treatment complications. She is here for rediscussion of surgery and to set up dates of surgery and repeat imaging.    PERTINENT REVIEW OF SYSTEMS: Otherwise negative.     EXAM: Head: Normocephalic and atraumatic. Pt in good spirits.  Thinner than last visit.   Eyes:  Conjunctivae are normal. Pupils are equal, round, and reactive to light. No scleral icterus.  Neck:  Normal range of motion. Neck supple. No tracheal deviation present. No thyromegaly present.  Resp: No respiratory distress, normal effort. Abd:  Abdomen is soft, non distended and non tender. No masses are palpable.  There is no rebound and no guarding.  Neurological: Alert and oriented to person, place, and time. Coordination normal.  Skin: Skin is warm and dry. No rash noted. No diaphoretic. No erythema. No pallor.  Psychiatric: Normal mood and affect. Normal behavior. Judgment and thought content normal.      ASSESSMENT AND PLAN:   Pancreatic adenocarcinoma Patient has completed her neoadjuvant treatment. We will plan to do her surgery in approximately 6-8 weeks after completion of treatment. I will repeat her CT scan to make sure that she has no growth of her tumor and no increased encroachment onto the vessels.  She'll get a repeat tumor marker as well. I reviewed the surgical plan for a diagnostic laparoscopy followed by a Whipple if there is no evidence of metastatic disease.  I will need to see her back within 30 days of surgery. We reviewed the risks and benefits of surgery again.    I discussed the surgery with the patient  including diagrams of anatomy.  I discussed the potential for diagnostic laparoscopy.  In the case of pancreatic cancer, if spread of the disease is found, we will abort the procedure and not proceed with resection.  The rationale for this was discussed with the patient.  There has not been data to support resection of Stage IV disease in terms of survival benefit.    We discussed possible complications including: Potential of aborting procedure if tumor is invading the superior mesenteric or hepatic arteries Bleeding Infection and possible wound complications such as hernia Damage to adjacent structures Leak of anastamoses, primarily pancreatic Possible need for other procedures Possible prolonged hospital stay Possible development of diabetes or worsening of current diabetes.  Possible pancreatic exocrine insufficiency Prolonged fatigue/weakness/appetite Difficulty with eating or post operative nausea Possible early recurrence of cancer   The patient understands and wishes to proceed.  The patient has been advised to turn in disability paperwork to our office.       20 min spent with patient in counseling.    Maudry Diego, MD Surgical Oncology, General & Endocrine Surgery [facility]  Thora Lance, MD Manus Gunning Bryan Lemma, MD

## 2012-12-09 ENCOUNTER — Encounter: Payer: Medicare Other | Admitting: Nutrition

## 2012-12-10 NOTE — Progress Notes (Signed)
  Radiation Oncology         [phone] ________________________________  Name: Sheila Hurst MRN: [id number]  Date: 11/30/2012  DOB: June 30, 1941  End of Treatment Note  Diagnosis:  Pancreatic cancer     Indication for treatment:  Curative       Radiation treatment dates:   10/19/2012 through 11/30/2012  Site/dose:   The patient was treated to the gross disease within the abdomen and high risk surrounding region. She received a total of 50.4 gray at 1.8 gray per fraction. The patient was treated using an IMRT technique on our tomotherapy treatment machine with daily image guidance.  Narrative: The patient tolerated radiation treatment relatively well.   The patient had mild GI issues during treatment but overall did excellent.  Plan: The patient has completed radiation treatment. The patient will return to radiation oncology clinic for routine followup in one month. I advised the patient to call or return sooner if they have any questions or concerns related to their recovery or treatment. ________________________________  Radene Gunning, M.D., Ph.D.

## 2012-12-11 ENCOUNTER — Encounter (INDEPENDENT_AMBULATORY_CARE_PROVIDER_SITE_OTHER): Payer: Self-pay

## 2012-12-11 ENCOUNTER — Other Ambulatory Visit (HOSPITAL_BASED_OUTPATIENT_CLINIC_OR_DEPARTMENT_OTHER): Payer: Medicare Other | Admitting: Lab

## 2012-12-11 DIAGNOSIS — C259 Malignant neoplasm of pancreas, unspecified: Secondary | ICD-10-CM

## 2012-12-11 DIAGNOSIS — C250 Malignant neoplasm of head of pancreas: Secondary | ICD-10-CM

## 2012-12-11 LAB — CBC WITH DIFFERENTIAL/PLATELET
Eosinophils Absolute: 0.3 10*3/uL (ref 0.0–0.5)
LYMPH%: 7.3 % — ABNORMAL LOW (ref 14.0–49.7)
MONO#: 0.7 10*3/uL (ref 0.1–0.9)
NEUT#: 2.1 10*3/uL (ref 1.5–6.5)
Platelets: 156 10*3/uL (ref 145–400)
RBC: 4.2 10*6/uL (ref 3.70–5.45)
WBC: 3.5 10*3/uL — ABNORMAL LOW (ref 3.9–10.3)

## 2012-12-15 ENCOUNTER — Telehealth: Payer: Self-pay | Admitting: Nutrition

## 2012-12-15 ENCOUNTER — Encounter: Payer: Medicare Other | Admitting: Nutrition

## 2012-12-15 NOTE — Telephone Encounter (Signed)
Patient called and stated that she was ill and could not attend her 3:15 nutrition appointment today. She did not wish to reschedule at this time but states she will call me when she feels better to reschedule appointment.

## 2012-12-18 ENCOUNTER — Telehealth: Payer: Self-pay | Admitting: Dietician

## 2012-12-20 ENCOUNTER — Other Ambulatory Visit: Payer: Self-pay | Admitting: Oncology

## 2012-12-21 ENCOUNTER — Telehealth: Payer: Self-pay | Admitting: *Deleted

## 2012-12-21 NOTE — Telephone Encounter (Signed)
Walk in with daughter.  Reports problems began a week ago.  Spoke with on-call doctor, recalls being told to come in.  Has appendix scar tissue from the 70's.  Currently receives lovenox injections once daily for rt. leg dvt.  Now has formed knots in scar tissue area on left abdomen, having right sided back pain, (left pain started in 2013 with her pancreas).  Soreness to left hip and left abdomen is tender with a pulling sensation.  Has not taken anything for pain.  Instructed not to take ibuprofen.  No signs of hematoma.  This nurse felt a knot the size of a marble and an oblong firmness approximately 3"L x 0.5"W on the left abdomen with verticle, striated purple/blue bruises covering large area of left abdomen.  One tiny not to right side and no bruising.  Report her niece and nephew have been taught how to give injections, are rotating site and giving at a 45 degree angle.   Dr. Truett Perna notified and these are reactions from the lovenox.  Patient and her daughter notified and given [facility] "Lovenox, Enoxaparin Sodium home use" & "Subcutaneous injection" hand outs for review.  At this TIME ASKED ABOUT STOOLS BEING LIGHT TAN IN COLOR AND URINE COLOR CHANGE ASKING IF LOVENOX CAUSED THESE CHANGES.  INFORMED HER LOVENOX DID NOT CAUSE THESE CHANGES.

## 2012-12-23 ENCOUNTER — Other Ambulatory Visit (HOSPITAL_BASED_OUTPATIENT_CLINIC_OR_DEPARTMENT_OTHER): Payer: Medicare Other | Admitting: Lab

## 2012-12-23 ENCOUNTER — Ambulatory Visit (HOSPITAL_BASED_OUTPATIENT_CLINIC_OR_DEPARTMENT_OTHER): Payer: Medicare Other | Admitting: Oncology

## 2012-12-23 ENCOUNTER — Telehealth: Payer: Self-pay | Admitting: Oncology

## 2012-12-23 VITALS — BP 141/94 | HR 77 | Temp 97.6°F | Resp 20 | Ht 64.0 in | Wt 159.5 lb

## 2012-12-23 DIAGNOSIS — C259 Malignant neoplasm of pancreas, unspecified: Secondary | ICD-10-CM

## 2012-12-23 LAB — CBC WITH DIFFERENTIAL/PLATELET
EOS%: 8.6 % — ABNORMAL HIGH (ref 0.0–7.0)
MCH: 29.6 pg (ref 25.1–34.0)
MCHC: 33.2 g/dL (ref 31.5–36.0)
MCV: 89.1 fL (ref 79.5–101.0)
MONO%: 15.4 % — ABNORMAL HIGH (ref 0.0–14.0)
RBC: 4.03 10*6/uL (ref 3.70–5.45)
RDW: 16.2 % — ABNORMAL HIGH (ref 11.2–14.5)

## 2012-12-23 NOTE — Progress Notes (Signed)
   [facility]    OFFICE PROGRESS NOTE   INTERVAL HISTORY:   She returns as scheduled. She continues daily Lovenox. She has developed "bruises "at the sites of Lovenox injections. She reports an improved appetite. No nausea or abdominal pain. She has been scheduled for pancreas surgery in late May.  Objective:  Vital signs in last 24 hours:  Blood pressure 141/94, pulse 77, temperature 97.6 F (36.4 C), temperature source Oral, resp. rate 20, height 5\' 4"  (1.626 m), weight 159 lb 8 oz (72.349 kg).  Resp: lungs clear bilaterally Cardio: regular rate and rhythm GI: no hepatomegaly, nontender, no mass Vascular: the right lower leg is slightly larger than left side, no erythema  Skin:multiple ecchymoses at the sites of Lovenox injection over the abdominal wall    Lab Results:  Lab Results  Component Value Date   WBC 3.6* 12/23/2012   HGB 11.9 12/23/2012   HCT 35.9 12/23/2012   MCV 89.1 12/23/2012   PLT 170 12/23/2012      Medications: I have reviewed the patient's current medications.  Assessment/Plan: 1. 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 Elevated CA 19-9. She began radiation and concurrent Xeloda chemotherapy on 10/19/2012, radiation completed 11/30/2012 2. Pain secondary to pancreas cancer. Improved. 3. Right liver cyst. Smaller on MRI 09/28/2012 compared to 11/03/2008. 4. Hypertension currently taking Cardizem and Maxide. 5. Hyperlipidemia. 6. Family history of pancreas cancer (paternal aunt). 7. Anorexia/weight loss. 8. Bradycardia when here on 11/11/2012. Atenolol was placed on hold. 9. Abdominal pain with CT scan 11/24/2012 showing decrease in the size of the pancreatic head mass and fairly marked antral wall thickening of the stomach with surrounding inflammatory change.  She is taking Dilaudid. Due to the skin rash and "woozy" sensation after taking Dilaudid the Dilaudid was discontinued.Marland Kitchen She was given a new prescription for oxycodone 5 mg 1-2 tablets every 4 hours as needed and she was started on Protonix 40 mg daily. 10. Right leg DVT documented on a Doppler 11/26/2012-now on Lovenox.       11. Skin rash of unclear etiology. Question medication related. She was instructed to discontinue Dilaudid as noted above and also to discontinue Xeloda. The rash resolved   Disposition:  She has completed neoadjuvant therapy. Her performance status has improved. She will continue Lovenox for treatment of the DVT.She reports Dr. Donell Beers plans a restaging CT evaluation prior to surgery.she should continue Lovenox anticoagulation. I will defer the decision on placement of a perioperative IVC filter to Dr. Donell Beers.   Thornton Papas, MD  12/23/2012  2:50 PM

## 2012-12-23 NOTE — Telephone Encounter (Signed)
gv and printed appt sched adn avs for pt...sent pt back to the lab

## 2012-12-29 ENCOUNTER — Encounter: Payer: Self-pay | Admitting: Radiation Oncology

## 2012-12-31 ENCOUNTER — Ambulatory Visit
Admission: RE | Admit: 2012-12-31 | Discharge: 2012-12-31 | Disposition: A | Discharge status: Home / Self Care | Payer: Medicare Other | Source: Ambulatory Visit | Attending: Radiation Oncology | Admitting: Radiation Oncology

## 2012-12-31 VITALS — BP 136/79 | HR 70 | Temp 98.2°F | Wt 158.1 lb

## 2012-12-31 DIAGNOSIS — C250 Malignant neoplasm of head of pancreas: Secondary | ICD-10-CM

## 2012-12-31 HISTORY — PX: CHOLECYSTECTOMY: SHX55

## 2012-12-31 HISTORY — PX: PANCREATICODUODENECTOMY: SUR1000

## 2012-12-31 NOTE — Progress Notes (Signed)
Patient here for routine one month follow up completion of radiation for pancreatic cancer.Denies pain or nausea.Continues to take levonox until scheduled surgery with Dr.Byerly on 01/27/13.Ct scan from 11/24/12 reveals interval decrease in pancreatic head mass.Patient now able to drive self without difficulty.Has continued moderate fatigue.

## 2012-12-31 NOTE — Progress Notes (Signed)
  Radiation Oncology         [phone] ________________________________  Name: Sheila Hurst MRN: [id number]  Date: 12/31/2012  DOB: 10/18/1940  Follow-Up Visit Note  CC: Thora Lance, MD  Almond Lint, MD  Diagnosis:   Pancreatic cancer  Interval Since Last Radiation:  One month   Narrative:  The patient returns today for routine follow-up.  The patient states that she has done well. No ongoing GI issues. No nausea and fairly good appetite. She feels that her energy level has been fairly good after experiencing some ongoing moderate fatigue.  The patient is scheduled for surgery on 01/27/2013 after undergoing further evaluation.                              ALLERGIES:  is allergic to ondansetron.  Meds: Current Outpatient Prescriptions  Medication Sig Dispense Refill  . acetaminophen (TYLENOL) 325 MG tablet Take 650 mg by mouth every 6 (six) hours as needed for pain.      Marland Kitchen diltiazem (CARDIZEM) 90 MG tablet Take 90 mg by mouth daily.       Marland Kitchen enoxaparin (LOVENOX) 120 MG/0.8ML injection Inject 0.73 mLs (110 mg total) into the skin daily.  30 Syringe  6  . Glucosamine-Chondroit-Vit C-Mn (GLUCOSAMINE 1500 COMPLEX PO) Take 1 capsule by mouth daily.      . pantoprazole (PROTONIX) 40 MG tablet Take 1 tablet (40 mg total) by mouth daily.  30 tablet  2  . prochlorperazine (COMPAZINE) 10 MG tablet Take 10 mg by mouth every 6 (six) hours as needed (nausea).      . triamterene-hydrochlorothiazide (MAXZIDE-25) 37.5-25 MG per tablet Take 1 tablet by mouth daily.      Marland Kitchen HYDROmorphone (DILAUDID) 4 MG tablet Take 1 tablet (4 mg total) by mouth every 4 (four) hours as needed for pain.  10 tablet  0   No current facility-administered medications for this encounter.    Physical Findings: The patient is in no acute distress. Patient is alert and oriented.  weight is 158 lb 1.6 oz (71.714 kg). Her temperature is 98.2 F (36.8 C). Her blood pressure is 136/79 and her pulse is 70. Her oxygen  saturation is 97%. .     Lab Findings: Lab Results  Component Value Date   WBC 3.6* 12/23/2012   HGB 11.9 12/23/2012   HCT 35.9 12/23/2012   MCV 89.1 12/23/2012   PLT 170 12/23/2012     Radiographic Findings: No results found.  Impression:    The patient is doing well 1 month after completing her course of chemoradiotherapy. Surgery scheduled later this month.  Plan:  We discussed possible followup options and she is happy with following up when necessary. She knows to contact our office if we can be of any further assistance.   Radene Gunning, M.D., Ph.D.

## 2013-01-11 ENCOUNTER — Telehealth (INDEPENDENT_AMBULATORY_CARE_PROVIDER_SITE_OTHER): Payer: Self-pay | Admitting: General Surgery

## 2013-01-11 NOTE — Telephone Encounter (Signed)
Patient calling to see if she needs a CT. There is an order for an abdomen CT in the system from Dr Manus Gunning, but it does not include the pelvis. Per Dr Arita Miss last note "We will plan to do her surgery in approximately 6-8 weeks after completion of treatment. I will repeat her CT scan to make sure that she has no growth of her tumor and no increased encroachment onto the vessels." Please advise if we need to put in a new order and get this set up.

## 2013-01-12 ENCOUNTER — Other Ambulatory Visit (INDEPENDENT_AMBULATORY_CARE_PROVIDER_SITE_OTHER): Payer: Self-pay | Admitting: General Surgery

## 2013-01-12 ENCOUNTER — Telehealth (INDEPENDENT_AMBULATORY_CARE_PROVIDER_SITE_OTHER): Payer: Self-pay | Admitting: General Surgery

## 2013-01-12 DIAGNOSIS — C259 Malignant neoplasm of pancreas, unspecified: Secondary | ICD-10-CM

## 2013-01-12 NOTE — Telephone Encounter (Signed)
Left message for patient  She has a  Ct scan at [address] [facility]  01/13/13 2PM  No solids 11am

## 2013-01-13 ENCOUNTER — Ambulatory Visit
Admission: RE | Admit: 2013-01-13 | Discharge: 2013-01-13 | Disposition: A | Discharge status: Home / Self Care | Payer: Medicare Other | Source: Ambulatory Visit | Attending: General Surgery | Admitting: General Surgery

## 2013-01-13 DIAGNOSIS — C259 Malignant neoplasm of pancreas, unspecified: Secondary | ICD-10-CM

## 2013-01-13 MED ORDER — IOHEXOL 300 MG/ML  SOLN
100.0000 mL | Freq: Once | INTRAMUSCULAR | Status: AC | PRN
  Administered 2013-01-13: 100 mL via INTRAVENOUS

## 2013-01-14 ENCOUNTER — Encounter (HOSPITAL_COMMUNITY): Payer: Self-pay | Admitting: Pharmacy Technician

## 2013-01-20 ENCOUNTER — Encounter: Payer: Self-pay | Admitting: *Deleted

## 2013-01-21 ENCOUNTER — Encounter (HOSPITAL_COMMUNITY): Payer: Self-pay

## 2013-01-21 ENCOUNTER — Ambulatory Visit (HOSPITAL_BASED_OUTPATIENT_CLINIC_OR_DEPARTMENT_OTHER): Payer: Medicare Other | Admitting: Nurse Practitioner

## 2013-01-21 ENCOUNTER — Encounter (HOSPITAL_COMMUNITY)
Admission: RE | Admit: 2013-01-21 | Discharge: 2013-01-21 | Disposition: A | Discharge status: Home / Self Care | Payer: Medicare Other | Source: Ambulatory Visit | Attending: General Surgery | Admitting: General Surgery

## 2013-01-21 ENCOUNTER — Other Ambulatory Visit (HOSPITAL_BASED_OUTPATIENT_CLINIC_OR_DEPARTMENT_OTHER): Payer: Medicare Other | Admitting: Lab

## 2013-01-21 ENCOUNTER — Telehealth: Payer: Self-pay | Admitting: Oncology

## 2013-01-21 VITALS — BP 144/79 | HR 71 | Temp 97.1°F | Resp 18 | Ht 64.0 in | Wt 158.1 lb

## 2013-01-21 DIAGNOSIS — C259 Malignant neoplasm of pancreas, unspecified: Secondary | ICD-10-CM

## 2013-01-21 DIAGNOSIS — R109 Unspecified abdominal pain: Secondary | ICD-10-CM

## 2013-01-21 DIAGNOSIS — Z01812 Encounter for preprocedural laboratory examination: Secondary | ICD-10-CM | POA: Insufficient documentation

## 2013-01-21 DIAGNOSIS — Z01810 Encounter for preprocedural cardiovascular examination: Secondary | ICD-10-CM | POA: Insufficient documentation

## 2013-01-21 DIAGNOSIS — I82409 Acute embolism and thrombosis of unspecified deep veins of unspecified lower extremity: Secondary | ICD-10-CM

## 2013-01-21 DIAGNOSIS — C250 Malignant neoplasm of head of pancreas: Secondary | ICD-10-CM

## 2013-01-21 DIAGNOSIS — Z800 Family history of malignant neoplasm of digestive organs: Secondary | ICD-10-CM

## 2013-01-21 DIAGNOSIS — R9431 Abnormal electrocardiogram [ECG] [EKG]: Secondary | ICD-10-CM | POA: Insufficient documentation

## 2013-01-21 LAB — COMPREHENSIVE METABOLIC PANEL
AST: 28 U/L (ref 0–37)
BUN: 11 mg/dL (ref 6–23)
CO2: 31 mEq/L (ref 19–32)
Chloride: 100 mEq/L (ref 96–112)
Creatinine, Ser: 0.7 mg/dL (ref 0.50–1.10)
GFR calc Af Amer: >90 mL/min (ref >90)
GFR calc non Af Amer: 86 mL/min — ABNORMAL LOW (ref >90)
Glucose, Bld: 106 mg/dL — ABNORMAL HIGH (ref 70–99)
Total Bilirubin: 0.5 mg/dL (ref 0.3–1.2)

## 2013-01-21 LAB — CBC WITH DIFFERENTIAL/PLATELET
BASO%: 0.5 % (ref 0.0–2.0)
EOS%: 8.3 % — ABNORMAL HIGH (ref 0.0–7.0)
LYMPH%: 13.3 % — ABNORMAL LOW (ref 14.0–49.7)
MCH: 30.6 pg (ref 25.1–34.0)
MCHC: 34 g/dL (ref 31.5–36.0)
MONO#: 0.4 10*3/uL (ref 0.1–0.9)
Platelets: 194 10*3/uL (ref 145–400)
RBC: 4.09 10*6/uL (ref 3.70–5.45)
WBC: 4.5 10*3/uL (ref 3.9–10.3)
lymph#: 0.6 10*3/uL — ABNORMAL LOW (ref 0.9–3.3)

## 2013-01-21 LAB — ABO/RH: ABO/RH(D): B POS

## 2013-01-21 LAB — PROTIME-INR: Prothrombin Time: 12.8 seconds (ref 11.6–15.2)

## 2013-01-21 LAB — CANCER ANTIGEN 19-9: CA 19-9: 51.4 U/mL — ABNORMAL HIGH (ref <35.0)

## 2013-01-21 NOTE — Progress Notes (Signed)
OFFICE PROGRESS NOTE  Interval history:  Sheila Hurst returns as scheduled. Her strength is improving. Appetite is also better. She denies nausea/vomiting. No pain except related to the Lovenox injections. She denies bleeding. Bowels moving regularly.   Objective: Blood pressure 144/79, pulse 71, temperature 97.1 F (36.2 C), temperature source Oral, resp. rate 18, height 5\' 4"  (1.626 m), weight 158 lb 1.6 oz (71.714 kg).  No thrush or ulcerations. Lungs clear. Regular cardiac rhythm. Abdomen is soft. Nontender. No hepatomegaly. Extremities without edema. Multiple ecchymoses scattered over the abdominal wall at the sites of Lovenox injection.  Lab Results: Lab Results  Component Value Date   WBC 4.5 01/21/2013   HGB 12.5 01/21/2013   HCT 36.8 01/21/2013   MCV 90.0 01/21/2013   PLT 194 01/21/2013    Chemistry:    Chemistry      Component Value Date/Time   NA 138 11/24/2012 2045   NA 142 10/05/2012 1234   K 3.1* 11/24/2012 2045   K 3.2* 10/05/2012 1234   CL 99 11/24/2012 2045   CL 104 10/05/2012 1234   CO2 30 11/24/2012 2045   CO2 27 10/05/2012 1234   BUN 8 11/24/2012 2045   BUN 19.0 10/05/2012 1234   CREATININE 0.57 11/24/2012 2045   CREATININE 0.8 10/05/2012 1234      Component Value Date/Time   CALCIUM 9.1 11/24/2012 2045   CALCIUM 9.2 10/05/2012 1234   ALKPHOS 91 11/24/2012 2045   ALKPHOS 119 10/05/2012 1234   AST 26 11/24/2012 2045   AST 16 10/05/2012 1234   ALT 19 11/24/2012 2045   ALT 14 10/05/2012 1234   BILITOT 0.5 11/24/2012 2045   BILITOT 0.30 10/05/2012 1234       Studies/Results: Ct Abdomen Pelvis W Wo Contrast  01/13/2013   *RADIOLOGY REPORT*  BUN and creatinine were obtained on site at [facility] at [address] Results:  BUN 10 mg/dL,  Creatinine 0.8 mg/dL.  Clinical Data: Pancreatic carcinoma.  Status post abdomen chemotherapy and radiation therapy.  Preop exam.  Left-sided abdominal pain.  CT ABDOMEN AND PELVIS WITHOUT AND WITH CONTRAST  Technique:  Multidetector CT  imaging of the abdomen and pelvis was performed without contrast material in one or both body regions, followed by contrast material(s) and further sections in one or both body regions.  Contrast: OMNIPAQUE IOHEXOL 300 MG/ML  SOLN  Comparison: 11/24/2012  Findings: Mass at the junction of the pancreatic head and neck shows interval decrease in size, currently measuring 1.2 x 1.7 cm on image 109 of series 3, compared to 1.8 x 2.3 cm previously. This mass shows no encasement of the superior mesenteric artery or vein. There is no evidence of portal vein involvement thrombosis. Pancreatic ductal dilatation in the distal body and tail is unchanged in appearance.  No separate sites of peripancreatic adenopathy identified.  Fluid attenuation cyst in the posterior right hepatic lobe is stable, measuring 5.5 cm.  No liver masses are identified. Gallbladder is unremarkable, and there is no evidence of biliary ductal dilatation.  The spleen, adrenal glands, and kidneys are normal appearance.  No evidence of hydronephrosis.  No lymphadenopathy seen elsewhere within the abdomen or pelvis.  No evidence of inflammatory process or abnormal fluid collections. Some residual barium is seen within a diverticulum in the cecum and mild diverticulosis is again seen involving the sigmoid colon, however there is no evidence of diverticulitis.  Prior hysterectomy noted.  Adnexal regions are unremarkable.  No suspicious bone lesions identified.  IMPRESSION:  1.  Mild decrease in size of primary pancreatic carcinoma at the junction of pancreatic head and neck.  No radiographic contraindications to surgical resection noted. 2.  No evidence of metastatic disease.   Original Report Authenticated By: Myles Rosenthal, M.D.    Medications: I have reviewed the patient's current medications.  Assessment/Plan:  1. 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 Elevated CA 19-9. She began radiation and concurrent Xeloda chemotherapy on 10/19/2012, radiation completed 11/30/2012. 2. Pain secondary to pancreas cancer. Improved. 3. Right liver cyst. Smaller on MRI 09/28/2012 compared to 11/03/2008. 4. Hypertension currently taking Cardizem and Maxide. 5. Hyperlipidemia. 6. Family history of pancreas cancer (paternal aunt). 7. Anorexia/weight loss. Improved. 8. Bradycardia when here on 11/11/2012. Atenolol was placed on hold. 9. Abdominal pain with CT scan 11/24/2012 showing decrease in the size of the pancreatic head mass and fairly marked antral wall thickening of the stomach with surrounding inflammatory change. She is taking Dilaudid. Due to the skin rash and "woozy" sensation after taking Dilaudid the Dilaudid was discontinued. She was given a new prescription for oxycodone 5 mg 1-2 tablets every 4 hours as needed and she was started on Protonix 40 mg daily. 10. Right leg DVT documented on a Doppler 11/26/2012-now on Lovenox. 11. Skin rash of unclear etiology. Question medication related. She does continue Dilaudid and Xeloda. The rash resolved. 12. CT abdomen/pelvis 01/13/2013 with decrease in the size of the mass at the junction of the pancreatic head and neck with a current measurement of 1.2 x 1.7 cm compared to 1.8 x 2.3 cm previously. No encasement of the superior mesenteric artery or vein. No evidence of portal vein involvement. No separate sites of peripancreatic adenopathy. No evidence of metastatic disease.  Disposition-Sheila Hurst performance status continues to improve. She is scheduled for surgery 01/28/2013. She will contact Dr. Arita Miss office for instructions regarding Lovenox. She will return for a followup visit with Dr. Truett Perna on 02/19/2013 to review the pathology report and discuss additional chemotherapy.  Plan reviewed with Dr.  Truett Perna.   Lonna Cobb ANP/GNP-BC

## 2013-01-21 NOTE — Pre-Procedure Instructions (Addendum)
01-21-13 CT Chest 2'14 -Epic. EKG done today. Labs C/d, CA 19.9 [facility]results in Epic.

## 2013-01-21 NOTE — Patient Instructions (Addendum)
20 Sheila Hurst  01/21/2013   Your procedure is scheduled on: 5-29  -2014  Report to [facility] at  0530      AM.  Call this number if you have problems the morning of surgery: [phone]  Or Presurgical Testing [phone]([place])   Remember: Follow any bowel prep instructions per MD office.( Use 1 Fleet enema night before surgery rectally)I have attempted to contact this patient by phone with the following results: {call MD office inquire of diet day before surgery).    Do not eat food:After Midnight.      Take these medicines the morning of surgery with A SIP OF WATER: Pantoprazole.Pain med if needed.   Do not wear jewelry, make-up or nail polish.  Do not wear lotions, powders, or perfumes. You may wear deodorant.  Do not shave 12 hours prior to first CHG shower(legs and under arms).(face and neck okay.)  Do not bring valuables to the hospital.  Contacts, dentures or bridgework,body piercing,  may not be worn into surgery.  Leave suitcase in the car. After surgery it may be brought to your room.  For patients admitted to the hospital, checkout time is 11:00 AM the day of discharge.   Patients discharged the day of surgery will not be allowed to drive home. Must have responsible person with you x 24 hours once discharged.  Name and phone number of your driver: Christie Beckers, daughter [phone] cell  Special Instructions: CHG(Chlorhedine 4%-"Hibiclens","Betasept","Aplicare") Shower Use Special Wash: see special instructions.(avoid face and genitals)   Please read over the following fact sheets that you were given: MRSA Information, Blood Transfusion fact sheet, Incentive Spirometry Instruction.    Failure to follow these instructions may result in Cancellation of your surgery.   Patient signature_______________________________________________________

## 2013-01-27 NOTE — Anesthesia Preprocedure Evaluation (Addendum)
Anesthesia Evaluation  Patient identified by MRN, date of birth, ID band Patient awake    Reviewed: Allergy & Precautions, H&P , NPO status , Patient's Chart, lab work & pertinent test results, reviewed documented beta blocker date and time   Airway Mallampati: II TM Distance: >3 FB Neck ROM: full    Dental  (+) Edentulous Upper and Dental Advisory Given   Pulmonary neg pulmonary ROS,  breath sounds clear to auscultation  Pulmonary exam normal       Cardiovascular Exercise Tolerance: Good hypertension, Pt. 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   Neuro/Psych negative neurological ROS  negative psych ROS   GI/Hepatic negative GI ROS, Neg liver ROS, GERD-  Medicated and Controlled,  Endo/Other  negative endocrine ROS  Renal/GU negative Renal ROS  negative genitourinary   Musculoskeletal   Abdominal   Peds  Hematology negative hematology ROS (+)   Anesthesia Other Findings   Reproductive/Obstetrics negative OB ROS                          Anesthesia Physical Anesthesia Plan  ASA: II  Anesthesia Plan: General   Post-op Pain Management:    Induction: Intravenous  Airway Management Planned: Oral ETT  Additional Equipment:   Intra-op Plan:   Post-operative Plan: Extubation in OR  Informed Consent: I have reviewed the patients History and Physical, chart, labs and discussed the procedure including the risks, benefits and alternatives for the proposed anesthesia with the patient or authorized representative who has indicated his/her understanding and acceptance.   Dental Advisory Given  Plan Discussed with: CRNA and Surgeon  Anesthesia Plan Comments:         Anesthesia Quick Evaluation

## 2013-01-28 ENCOUNTER — Encounter (HOSPITAL_COMMUNITY): Payer: Self-pay | Admitting: Anesthesiology

## 2013-01-28 ENCOUNTER — Encounter (HOSPITAL_COMMUNITY): Payer: Self-pay | Admitting: *Deleted

## 2013-01-28 ENCOUNTER — Inpatient Hospital Stay (HOSPITAL_COMMUNITY)
Admission: RE | Admit: 2013-01-28 | Discharge: 2013-02-09 | DRG: 406 | Disposition: A | Discharge status: Home / Self Care | Payer: Medicare Other | Source: Ambulatory Visit | Attending: General Surgery | Admitting: General Surgery

## 2013-01-28 ENCOUNTER — Encounter (HOSPITAL_COMMUNITY)
Admission: RE | Disposition: A | Discharge status: Home / Self Care | Payer: Self-pay | Source: Ambulatory Visit | Attending: General Surgery

## 2013-01-28 ENCOUNTER — Ambulatory Visit (HOSPITAL_COMMUNITY): Payer: Medicare Other | Admitting: Anesthesiology

## 2013-01-28 DIAGNOSIS — Z01812 Encounter for preprocedural laboratory examination: Secondary | ICD-10-CM

## 2013-01-28 DIAGNOSIS — C259 Malignant neoplasm of pancreas, unspecified: Secondary | ICD-10-CM

## 2013-01-28 DIAGNOSIS — C250 Malignant neoplasm of head of pancreas: Principal | ICD-10-CM | POA: Diagnosis present

## 2013-01-28 DIAGNOSIS — E785 Hyperlipidemia, unspecified: Secondary | ICD-10-CM | POA: Diagnosis present

## 2013-01-28 DIAGNOSIS — Y836 Removal of other organ (partial) (total) as the cause of abnormal reaction of the patient, or of later complication, without mention of misadventure at the time of the procedure: Secondary | ICD-10-CM | POA: Diagnosis not present

## 2013-01-28 DIAGNOSIS — R634 Abnormal weight loss: Secondary | ICD-10-CM | POA: Diagnosis present

## 2013-01-28 DIAGNOSIS — K929 Disease of digestive system, unspecified: Secondary | ICD-10-CM | POA: Diagnosis not present

## 2013-01-28 DIAGNOSIS — K219 Gastro-esophageal reflux disease without esophagitis: Secondary | ICD-10-CM | POA: Diagnosis present

## 2013-01-28 DIAGNOSIS — K560 Paralytic ileus: Secondary | ICD-10-CM | POA: Diagnosis not present

## 2013-01-28 DIAGNOSIS — I10 Essential (primary) hypertension: Secondary | ICD-10-CM | POA: Diagnosis present

## 2013-01-28 HISTORY — PX: LAPAROSCOPY: SHX197

## 2013-01-28 HISTORY — PX: WHIPPLE PROCEDURE: SHX2667

## 2013-01-28 LAB — HEMOGLOBIN A1C: Hgb A1c MFr Bld: 5.6 % (ref <5.7)

## 2013-01-28 SURGERY — LAPAROSCOPY, DIAGNOSTIC
Anesthesia: General | Site: Abdomen | Wound class: Clean Contaminated

## 2013-01-28 MED ORDER — LIDOCAINE HCL (PF) 1 % IJ SOLN
INTRAMUSCULAR | Status: DC | PRN
  Administered 2013-01-28: 40 mL

## 2013-01-28 MED ORDER — MICROFIBRILLAR COLL HEMOSTAT EX POWD
CUTANEOUS | Status: AC
  Filled 2013-01-28: qty 5

## 2013-01-28 MED ORDER — PROPOFOL 10 MG/ML IV BOLUS
INTRAVENOUS | Status: DC | PRN
  Administered 2013-01-28: 150 mg via INTRAVENOUS

## 2013-01-28 MED ORDER — MORPHINE SULFATE 2 MG/ML IJ SOLN
1.0000 mg | INTRAMUSCULAR | Status: DC | PRN
  Administered 2013-02-04: 1 mg via INTRAVENOUS
  Administered 2013-02-04: 2 mg via INTRAVENOUS
  Administered 2013-02-05: 1 mg via INTRAVENOUS
  Administered 2013-02-05 (×4): 2 mg via INTRAVENOUS
  Administered 2013-02-08: 4 mg via INTRAVENOUS
  Filled 2013-01-28 (×6): qty 1
  Filled 2013-01-28: qty 2
  Filled 2013-01-28 (×2): qty 1

## 2013-01-28 MED ORDER — LACTATED RINGERS IV SOLN
INTRAVENOUS | Status: DC | PRN
  Administered 2013-01-28 (×3): via INTRAVENOUS

## 2013-01-28 MED ORDER — DIPHENHYDRAMINE HCL 50 MG/ML IJ SOLN
12.5000 mg | Freq: Four times a day (QID) | INTRAMUSCULAR | Status: DC | PRN
  Administered 2013-02-01: 21:00:00 via INTRAVENOUS
  Administered 2013-02-02: 12.5 mg via INTRAVENOUS
  Filled 2013-01-28 (×2): qty 1

## 2013-01-28 MED ORDER — ENOXAPARIN SODIUM 120 MG/0.8ML ~~LOC~~ SOLN
120.0000 mg | Freq: Every day | SUBCUTANEOUS | Status: DC
  Administered 2013-01-29 – 2013-02-08 (×11): 120 mg via SUBCUTANEOUS
  Filled 2013-01-28 (×12): qty 0.8

## 2013-01-28 MED ORDER — MORPHINE SULFATE (PF) 1 MG/ML IV SOLN
INTRAVENOUS | Status: DC
  Administered 2013-01-28: 6 mg via INTRAVENOUS
  Administered 2013-01-28: 1 mg via INTRAVENOUS
  Administered 2013-01-28: 14:00:00 via INTRAVENOUS
  Administered 2013-01-29: 1 mg via INTRAVENOUS
  Administered 2013-01-29: 4.5 mg via INTRAVENOUS
  Administered 2013-01-29: 1.5 mg via INTRAVENOUS
  Administered 2013-01-29: 9 mg via INTRAVENOUS
  Administered 2013-01-29: 1 mg via INTRAVENOUS
  Administered 2013-01-29: 12 mg via INTRAVENOUS
  Administered 2013-01-29: 4.5 mg via INTRAVENOUS
  Administered 2013-01-29: 6 mg via INTRAVENOUS
  Administered 2013-01-29 – 2013-01-30 (×2): via INTRAVENOUS
  Administered 2013-01-30 (×2): 4.5 mg via INTRAVENOUS
  Administered 2013-01-31: 4 mg via INTRAVENOUS
  Administered 2013-01-31: 4.5 mg via INTRAVENOUS
  Administered 2013-01-31: 4 mg via INTRAVENOUS
  Administered 2013-01-31: 08:00:00 via INTRAVENOUS
  Administered 2013-02-01: 1.5 mg via INTRAVENOUS
  Administered 2013-02-01: 6 mg via INTRAVENOUS
  Administered 2013-02-01 (×3): 3 mg via INTRAVENOUS
  Administered 2013-02-02: 1.5 mg via INTRAVENOUS
  Administered 2013-02-02: 25 mg via INTRAVENOUS
  Administered 2013-02-03: 11:00:00 via INTRAVENOUS
  Administered 2013-02-03: 3 mg via INTRAVENOUS
  Administered 2013-02-03: 7.5 mg via INTRAVENOUS
  Administered 2013-02-03: 10.5 mg via INTRAVENOUS
  Administered 2013-02-03: 6 mg via INTRAVENOUS
  Administered 2013-02-03: 2.83 mg via INTRAVENOUS
  Administered 2013-02-04: 3 mg via INTRAVENOUS
  Administered 2013-02-04: 1.5 mg via INTRAVENOUS
  Administered 2013-02-04: 4.5 mg via INTRAVENOUS
  Filled 2013-01-28 (×6): qty 25

## 2013-01-28 MED ORDER — DIPHENHYDRAMINE HCL 12.5 MG/5ML PO ELIX: 12.5000 mg | ORAL_SOLUTION | Freq: Four times a day (QID) | ORAL | Status: DC | PRN

## 2013-01-28 MED ORDER — HYDRALAZINE HCL 20 MG/ML IJ SOLN
20.0000 mg | INTRAMUSCULAR | Status: DC | PRN
  Administered 2013-01-28 – 2013-02-08 (×6): 20 mg via INTRAVENOUS
  Filled 2013-01-28 (×6): qty 1

## 2013-01-28 MED ORDER — DEXTROSE 5 % IV SOLN
2.0000 g | INTRAVENOUS | Status: AC
  Administered 2013-01-28: 2 g via INTRAVENOUS
  Filled 2013-01-28: qty 2

## 2013-01-28 MED ORDER — PROMETHAZINE HCL 25 MG/ML IJ SOLN
INTRAMUSCULAR | Status: DC | PRN
  Administered 2013-01-28: 12.5 mg via INTRAVENOUS

## 2013-01-28 MED ORDER — HYDRALAZINE HCL 20 MG/ML IJ SOLN
INTRAMUSCULAR | Status: DC | PRN
  Administered 2013-01-28 (×2): 5 mg via INTRAVENOUS

## 2013-01-28 MED ORDER — NALOXONE HCL 0.4 MG/ML IJ SOLN: 0.4000 mg | INTRAMUSCULAR | Status: DC | PRN

## 2013-01-28 MED ORDER — LIDOCAINE HCL 1 % IJ SOLN
INTRAMUSCULAR | Status: AC
  Filled 2013-01-28: qty 40

## 2013-01-28 MED ORDER — CHLORHEXIDINE GLUCONATE 0.12 % MT SOLN
15.0000 mL | Freq: Two times a day (BID) | OROMUCOSAL | Status: DC
  Administered 2013-01-28 – 2013-02-09 (×20): 15 mL via OROMUCOSAL
  Filled 2013-01-28 (×26): qty 15

## 2013-01-28 MED ORDER — 0.9 % SODIUM CHLORIDE (POUR BTL) OPTIME
TOPICAL | Status: DC | PRN
  Administered 2013-01-28: 3000 mL

## 2013-01-28 MED ORDER — DEXAMETHASONE SODIUM PHOSPHATE 10 MG/ML IJ SOLN
INTRAMUSCULAR | Status: DC | PRN
  Administered 2013-01-28: 8 mg via INTRAVENOUS

## 2013-01-28 MED ORDER — INSULIN ASPART 100 UNIT/ML ~~LOC~~ SOLN
0.0000 [IU] | SUBCUTANEOUS | Status: DC
  Administered 2013-01-28: 5 [IU] via SUBCUTANEOUS
  Administered 2013-01-28: 8 [IU] via SUBCUTANEOUS
  Administered 2013-01-28: 5 [IU] via SUBCUTANEOUS
  Administered 2013-01-29: 3 [IU] via SUBCUTANEOUS
  Administered 2013-01-29 (×2): 2 [IU] via SUBCUTANEOUS
  Administered 2013-01-29 (×2): 3 [IU] via SUBCUTANEOUS
  Administered 2013-01-29 – 2013-01-31 (×4): 2 [IU] via SUBCUTANEOUS
  Administered 2013-01-31: 3 [IU] via SUBCUTANEOUS
  Administered 2013-01-31 – 2013-02-01 (×3): 2 [IU] via SUBCUTANEOUS
  Administered 2013-02-01: 3 [IU] via SUBCUTANEOUS
  Administered 2013-02-01: 2 [IU] via SUBCUTANEOUS
  Administered 2013-02-02: 1 [IU] via SUBCUTANEOUS
  Administered 2013-02-03 – 2013-02-07 (×7): 2 [IU] via SUBCUTANEOUS

## 2013-01-28 MED ORDER — TISSEEL VH 10 ML EX KIT
PACK | CUTANEOUS | Status: DC | PRN
  Administered 2013-01-28: 1

## 2013-01-28 MED ORDER — CEFOXITIN SODIUM-DEXTROSE 1-4 GM-% IV SOLR (PREMIX)
INTRAVENOUS | Status: AC
  Filled 2013-01-28: qty 100

## 2013-01-28 MED ORDER — HYDROMORPHONE HCL PF 1 MG/ML IJ SOLN
INTRAMUSCULAR | Status: DC | PRN
  Administered 2013-01-28 (×2): 0.5 mg via INTRAVENOUS
  Administered 2013-01-28: 1 mg via INTRAVENOUS

## 2013-01-28 MED ORDER — LACTATED RINGERS IV SOLN: INTRAVENOUS | Status: DC

## 2013-01-28 MED ORDER — FENTANYL CITRATE 0.05 MG/ML IJ SOLN
INTRAMUSCULAR | Status: DC | PRN
  Administered 2013-01-28 (×3): 50 ug via INTRAVENOUS
  Administered 2013-01-28: 100 ug via INTRAVENOUS

## 2013-01-28 MED ORDER — BUPIVACAINE ON-Q PAIN PUMP (FOR ORDER SET NO CHG)
INJECTION | Status: AC
  Filled 2013-01-28: qty 1

## 2013-01-28 MED ORDER — KCL IN DEXTROSE-NACL 20-5-0.45 MEQ/L-%-% IV SOLN
INTRAVENOUS | Status: AC
  Filled 2013-01-28: qty 1000

## 2013-01-28 MED ORDER — HYDRALAZINE HCL 20 MG/ML IJ SOLN
INTRAMUSCULAR | Status: AC
  Filled 2013-01-28: qty 1

## 2013-01-28 MED ORDER — TISSEEL VH 10 ML EX KIT
PACK | CUTANEOUS | Status: AC
  Filled 2013-01-28: qty 2

## 2013-01-28 MED ORDER — BIOTENE DRY MOUTH MT LIQD
15.0000 mL | Freq: Two times a day (BID) | OROMUCOSAL | Status: DC
  Administered 2013-01-29 – 2013-02-07 (×14): 15 mL via OROMUCOSAL

## 2013-01-28 MED ORDER — NEOSTIGMINE METHYLSULFATE 1 MG/ML IJ SOLN
INTRAMUSCULAR | Status: DC | PRN
  Administered 2013-01-28: 4 mg via INTRAVENOUS

## 2013-01-28 MED ORDER — HYDROMORPHONE HCL PF 1 MG/ML IJ SOLN
0.2500 mg | INTRAMUSCULAR | Status: DC | PRN
  Administered 2013-01-28 (×3): 0.5 mg via INTRAVENOUS
  Administered 2013-01-28: 0.25 mg via INTRAVENOUS

## 2013-01-28 MED ORDER — HYDROMORPHONE HCL PF 1 MG/ML IJ SOLN
INTRAMUSCULAR | Status: AC
  Filled 2013-01-28: qty 1

## 2013-01-28 MED ORDER — PROMETHAZINE HCL 25 MG/ML IJ SOLN
INTRAMUSCULAR | Status: AC
  Filled 2013-01-28: qty 1

## 2013-01-28 MED ORDER — ACETAMINOPHEN 10 MG/ML IV SOLN
1000.0000 mg | Freq: Four times a day (QID) | INTRAVENOUS | Status: AC
  Administered 2013-01-28 – 2013-01-29 (×4): 1000 mg via INTRAVENOUS
  Filled 2013-01-28 (×3): qty 100

## 2013-01-28 MED ORDER — ACETAMINOPHEN 10 MG/ML IV SOLN
INTRAVENOUS | Status: AC
  Filled 2013-01-28: qty 100

## 2013-01-28 MED ORDER — INSULIN ASPART 100 UNIT/ML ~~LOC~~ SOLN
SUBCUTANEOUS | Status: AC
  Filled 2013-01-28: qty 5

## 2013-01-28 MED ORDER — PROMETHAZINE HCL 25 MG/ML IJ SOLN
12.5000 mg | Freq: Four times a day (QID) | INTRAMUSCULAR | Status: DC | PRN
  Administered 2013-02-07: 12.5 mg via INTRAVENOUS
  Filled 2013-01-28: qty 1

## 2013-01-28 MED ORDER — SODIUM CHLORIDE 0.9 % IJ SOLN: 9.0000 mL | INTRAMUSCULAR | Status: DC | PRN

## 2013-01-28 MED ORDER — CISATRACURIUM BESYLATE (PF) 10 MG/5ML IV SOLN
INTRAVENOUS | Status: DC | PRN
  Administered 2013-01-28: 2 mg via INTRAVENOUS
  Administered 2013-01-28: 6 mg via INTRAVENOUS
  Administered 2013-01-28 (×7): 4 mg via INTRAVENOUS

## 2013-01-28 MED ORDER — LIDOCAINE HCL (CARDIAC) 20 MG/ML IV SOLN
INTRAVENOUS | Status: DC | PRN
  Administered 2013-01-28: 100 mg via INTRAVENOUS

## 2013-01-28 MED ORDER — BUPIVACAINE 0.25 % ON-Q PUMP DUAL CATH 300 ML
300.0000 mL | INJECTION | Status: DC
  Administered 2013-01-28: 270 mL
  Filled 2013-01-28: qty 300

## 2013-01-28 MED ORDER — DEXTROSE 5 % IV SOLN
1.0000 g | Freq: Four times a day (QID) | INTRAVENOUS | Status: AC
  Administered 2013-01-28 – 2013-01-29 (×3): 1 g via INTRAVENOUS
  Filled 2013-01-28 (×3): qty 1

## 2013-01-28 MED ORDER — GLYCOPYRROLATE 0.2 MG/ML IJ SOLN
INTRAMUSCULAR | Status: DC | PRN
  Administered 2013-01-28: 0.6 mg via INTRAVENOUS

## 2013-01-28 MED ORDER — MORPHINE SULFATE (PF) 1 MG/ML IV SOLN
INTRAVENOUS | Status: AC
  Filled 2013-01-28: qty 25

## 2013-01-28 MED ORDER — HYDRALAZINE HCL 20 MG/ML IJ SOLN
10.0000 mg | INTRAMUSCULAR | Status: DC | PRN
  Administered 2013-01-28: 10 mg via INTRAVENOUS

## 2013-01-28 MED ORDER — SUCCINYLCHOLINE CHLORIDE 20 MG/ML IJ SOLN
INTRAMUSCULAR | Status: DC | PRN
  Administered 2013-01-28: 100 mg via INTRAVENOUS

## 2013-01-28 MED ORDER — BUPIVACAINE-EPINEPHRINE 0.25% -1:200000 IJ SOLN
INTRAMUSCULAR | Status: DC | PRN
  Administered 2013-01-28: 50 mL

## 2013-01-28 MED ORDER — PANTOPRAZOLE SODIUM 40 MG IV SOLR
40.0000 mg | Freq: Every day | INTRAVENOUS | Status: DC
  Administered 2013-01-28 – 2013-02-08 (×12): 40 mg via INTRAVENOUS
  Filled 2013-01-28 (×13): qty 40

## 2013-01-28 MED ORDER — THROMBIN 5000 UNITS EX SOLR
CUTANEOUS | Status: AC
  Filled 2013-01-28: qty 10000

## 2013-01-28 MED ORDER — METOPROLOL TARTRATE 1 MG/ML IV SOLN
5.0000 mg | Freq: Four times a day (QID) | INTRAVENOUS | Status: DC
  Administered 2013-01-28 – 2013-02-09 (×42): 5 mg via INTRAVENOUS
  Filled 2013-01-28 (×51): qty 5

## 2013-01-28 MED ORDER — LACTATED RINGERS IV SOLN
INTRAVENOUS | Status: DC | PRN
  Administered 2013-01-28 (×2): via INTRAVENOUS

## 2013-01-28 MED ORDER — KCL IN DEXTROSE-NACL 20-5-0.45 MEQ/L-%-% IV SOLN
INTRAVENOUS | Status: DC
  Administered 2013-01-29: 100 mL/h via INTRAVENOUS
  Administered 2013-01-29 – 2013-01-31 (×4): via INTRAVENOUS
  Administered 2013-02-01: 100 mL/h via INTRAVENOUS
  Administered 2013-02-02 – 2013-02-04 (×4): via INTRAVENOUS
  Filled 2013-01-28 (×16): qty 1000

## 2013-01-28 MED ORDER — BUPIVACAINE-EPINEPHRINE 0.25% -1:200000 IJ SOLN
INTRAMUSCULAR | Status: AC
  Filled 2013-01-28: qty 1

## 2013-01-28 SURGICAL SUPPLY — 143 items
ADH SKN CLS APL DERMABOND .7 (GAUZE/BANDAGES/DRESSINGS)
APL SKNCLS STERI-STRIP NONHPOA (GAUZE/BANDAGES/DRESSINGS)
BENZOIN TINCTURE PRP APPL 2/3 (GAUZE/BANDAGES/DRESSINGS) IMPLANT
BLADE EXTENDED COATED 6.5IN (ELECTRODE) ×2 IMPLANT
BLADE HEX COATED 2.75 (ELECTRODE) ×2 IMPLANT
BLADE SURG SZ10 CARB STEEL (BLADE) ×2 IMPLANT
BOOT SUTURE VASCULAR YLW (MISCELLANEOUS) ×2
BRR ADH 5X3 SEPRAFILM 6 SHT (MISCELLANEOUS) ×1
CANISTER SUCTION 2500CC (MISCELLANEOUS) ×2 IMPLANT
CANNULA ENDOPATH XCEL 11M (ENDOMECHANICALS) IMPLANT
CATH FOLEY 2WAY SLVR  5CC 16FR (CATHETERS)
CATH FOLEY 2WAY SLVR 5CC 16FR (CATHETERS) IMPLANT
CATH KIT ON Q 7.5IN SLV (PAIN MANAGEMENT) ×4 IMPLANT
CATH KIT ON-Q SILVERSOAK 5IN (CATHETERS) IMPLANT
CATH ROBINSON RED A/P 12FR (CATHETERS) IMPLANT
CATH ROBINSON RED A/P 14FR (CATHETERS) IMPLANT
CATH ROBINSON RED A/P 16FR (CATHETERS) IMPLANT
CATH ROBINSON RED A/P 18FR (CATHETERS) IMPLANT
CATH ROBINSON RED A/P 20FR (CATHETERS) IMPLANT
CHLORAPREP W/TINT 26ML (MISCELLANEOUS) ×2 IMPLANT
CLAMP SUTURE YELLOW 5 PAIRS (MISCELLANEOUS) ×1 IMPLANT
CLIP LIGATING HEM O LOK PURPLE (MISCELLANEOUS) ×10 IMPLANT
CLIP LIGATING HEMO O LOK GREEN (MISCELLANEOUS) ×2 IMPLANT
CLIP LIGATING HEMOLOK MED (MISCELLANEOUS) ×2 IMPLANT
CLIP TI LARGE 6 (CLIP) ×8 IMPLANT
CLIP TI MEDIUM 6 (CLIP) ×4 IMPLANT
CLOTH BEACON ORANGE TIMEOUT ST (SAFETY) ×2 IMPLANT
CONT SPECI 4OZ STER CLIK (MISCELLANEOUS) IMPLANT
COUNTER NEEDLE 20 DBL MAG RED (NEEDLE) ×2 IMPLANT
COVER MAYO STAND STRL (DRAPES) ×2 IMPLANT
CUTTER FLEX LINEAR 45M (STAPLE) IMPLANT
DECANTER SPIKE VIAL GLASS SM (MISCELLANEOUS) ×4 IMPLANT
DERMABOND ADVANCED (GAUZE/BANDAGES/DRESSINGS)
DERMABOND ADVANCED .7 DNX12 (GAUZE/BANDAGES/DRESSINGS) IMPLANT
DISSECTOR ROUND CHERRY 3/8 STR (MISCELLANEOUS) IMPLANT
DRAIN CHANNEL RND F F (WOUND CARE) ×4 IMPLANT
DRAIN PENROSE 1X36 LTX NONSTRL (WOUND CARE) ×2 IMPLANT
DRAPE C-ARM 42X72 X-RAY (DRAPES) IMPLANT
DRAPE CAMERA CLOSED 9X96 (DRAPES) ×2 IMPLANT
DRAPE LAPAROSCOPIC ABDOMINAL (DRAPES) ×2 IMPLANT
DRAPE LG THREE QUARTER DISP (DRAPES) IMPLANT
DRAPE TOWEL STR TPT 18X26 WHT (DRAPES) ×4 IMPLANT
DRAPE UTILITY XL STRL (DRAPES) ×4 IMPLANT
DRAPE WARM FLUID 44X44 (DRAPE) ×2 IMPLANT
DRESSING TELFA ISLAND 4X8 (GAUZE/BANDAGES/DRESSINGS) IMPLANT
DRSG PAD ABDOMINAL 8X10 ST (GAUZE/BANDAGES/DRESSINGS) IMPLANT
DRSG TELFA 4X10 ISLAND STR (GAUZE/BANDAGES/DRESSINGS) ×2 IMPLANT
DRSG TELFA PLUS 4X6 ADH ISLAND (GAUZE/BANDAGES/DRESSINGS) ×2 IMPLANT
ELECT REM PT RETURN 9FT ADLT (ELECTROSURGICAL) ×2
ELECTRODE REM PT RTRN 9FT ADLT (ELECTROSURGICAL) ×1 IMPLANT
ENDOLOOP SUT PDS II  0 18 (SUTURE)
ENDOLOOP SUT PDS II 0 18 (SUTURE) IMPLANT
EVACUATOR DRAINAGE 10X20 100CC (DRAIN) ×2 IMPLANT
EVACUATOR SILICONE 100CC (DRAIN) ×4 IMPLANT
GAUZE SPONGE 2X2 8PLY STRL LF (GAUZE/BANDAGES/DRESSINGS) ×1 IMPLANT
GAUZE SPONGE 4X4 16PLY XRAY LF (GAUZE/BANDAGES/DRESSINGS) IMPLANT
GLOVE BIO SURGEON STRL SZ 6 (GLOVE) ×2 IMPLANT
GLOVE INDICATOR 6.5 STRL GRN (GLOVE) ×4 IMPLANT
GOWN PREVENTION PLUS XLARGE (GOWN DISPOSABLE) ×6 IMPLANT
GOWN PREVENTION PLUS XXLARGE (GOWN DISPOSABLE) IMPLANT
GOWN STRL NON-REIN LRG LVL3 (GOWN DISPOSABLE) IMPLANT
GOWN STRL REIN 2XL LVL4 (GOWN DISPOSABLE) ×2 IMPLANT
HAND ACTIVATED (MISCELLANEOUS) IMPLANT
HEMOSTAT SURGICEL 4X8 (HEMOSTASIS) IMPLANT
KIT BASIN OR (CUSTOM PROCEDURE TRAY) ×2 IMPLANT
LOOP MINI RED (MISCELLANEOUS) ×2 IMPLANT
LOOP VESSEL MAXI BLUE (MISCELLANEOUS) ×2 IMPLANT
NEEDLE BIOPSY 14GX4.5 SOFT TIS (NEEDLE) IMPLANT
NEEDLE HYPO 22GX1.5 SAFETY (NEEDLE) ×2 IMPLANT
NS IRRIG 1000ML POUR BTL (IV SOLUTION) ×4 IMPLANT
PACK GENERAL/GYN (CUSTOM PROCEDURE TRAY) ×2 IMPLANT
PACK UNIVERSAL I (CUSTOM PROCEDURE TRAY) ×2 IMPLANT
PLUG CATH AND CAP STER (CATHETERS) IMPLANT
RELOAD PROXIMATE 75MM BLUE (ENDOMECHANICALS) ×6 IMPLANT
SEPRAFILM PROCEDURAL PACK 3X5 (MISCELLANEOUS) ×2 IMPLANT
SET IRRIG TUBING LAPAROSCOPIC (IRRIGATION / IRRIGATOR) IMPLANT
SHEARS FOC LG CVD HARMONIC 17C (MISCELLANEOUS) ×2 IMPLANT
SLEEVE SURGEON STRL (DRAPES) IMPLANT
SLEEVE XCEL OPT CAN 5 100 (ENDOMECHANICALS) ×2 IMPLANT
SOLUTION ANTI FOG 6CC (MISCELLANEOUS) IMPLANT
SPONGE DRAIN TRACH 4X4 STRL 2S (GAUZE/BANDAGES/DRESSINGS) IMPLANT
SPONGE GAUZE 2X2 STER 10/PKG (GAUZE/BANDAGES/DRESSINGS) ×1
SPONGE GAUZE 4X4 12PLY (GAUZE/BANDAGES/DRESSINGS) IMPLANT
SPONGE LAP 18X18 X RAY DECT (DISPOSABLE) ×8 IMPLANT
SPONGE SURGIFOAM ABS GEL 100 (HEMOSTASIS) IMPLANT
STAPLER PROXIMATE 75MM BLUE (STAPLE) ×2 IMPLANT
STAPLER VISISTAT 35W (STAPLE) ×2 IMPLANT
STRIP CLOSURE SKIN 1/2X4 (GAUZE/BANDAGES/DRESSINGS) IMPLANT
SUCTION POOLE TIP (SUCTIONS) ×2 IMPLANT
SUT 5.0 PDS RB-1 (SUTURE)
SUT CHROMIC 3 0 SH 27 (SUTURE) IMPLANT
SUT CHROMIC 4 0 RB 1X27 (SUTURE) IMPLANT
SUT ETHIBOND 2-0 60INL (SUTURE) IMPLANT
SUT ETHILON 1 TP 1 60 (SUTURE) IMPLANT
SUT ETHILON 2 0 PS N (SUTURE) ×4 IMPLANT
SUT MNCRL AB 4-0 PS2 18 (SUTURE) IMPLANT
SUT PDS AB 0 CT1 36 (SUTURE) ×2 IMPLANT
SUT PDS AB 1 TP1 54 (SUTURE) IMPLANT
SUT PDS AB 1 TP1 96 (SUTURE) ×4 IMPLANT
SUT PDS AB 3-0 SH 27 (SUTURE) ×8 IMPLANT
SUT PDS AB 4-0 RB1 27 (SUTURE) ×22 IMPLANT
SUT PDS PLUS AB 5-0 RB-1 (SUTURE) IMPLANT
SUT PROLENE 3 0 SH 48 (SUTURE) ×2 IMPLANT
SUT PROLENE 3 0 SH1 36 (SUTURE) IMPLANT
SUT PROLENE 4 0 RB 1 (SUTURE) ×2
SUT PROLENE 4-0 RB1 .5 CRCL 36 (SUTURE) ×1 IMPLANT
SUT PROLENE 5 0 CC 1 (SUTURE) IMPLANT
SUT SILK 0 FSL (SUTURE) IMPLANT
SUT SILK 2 0 (SUTURE) ×2
SUT SILK 2 0 SH CR/8 (SUTURE) ×6 IMPLANT
SUT SILK 2-0 18XBRD TIE 12 (SUTURE) ×1 IMPLANT
SUT SILK 3 0 (SUTURE) ×2
SUT SILK 3 0 SH CR/8 (SUTURE) ×2 IMPLANT
SUT SILK 3-0 18XBRD TIE 12 (SUTURE) ×1 IMPLANT
SUT VIC AB 2-0 SH 27 (SUTURE) ×2
SUT VIC AB 2-0 SH 27X BRD (SUTURE) ×1 IMPLANT
SUT VIC AB 3-0 SH 18 (SUTURE) IMPLANT
SUT VIC AB 4-0 PS2 27 (SUTURE) IMPLANT
SUT VIC AB 4-0 SH 18 (SUTURE) ×4 IMPLANT
SUT VICRYL 2 0 18  UND BR (SUTURE)
SUT VICRYL 2 0 18 UND BR (SUTURE) IMPLANT
SUT VICRYL 3 0 BR 18  UND (SUTURE)
SUT VICRYL 3 0 BR 18 UND (SUTURE) IMPLANT
SYR CONTROL 10ML LL (SYRINGE) IMPLANT
SYRINGE 10CC LL (SYRINGE) IMPLANT
SYS LAPSCP GELPORT 120MM (MISCELLANEOUS)
SYSTEM LAPSCP GELPORT 120MM (MISCELLANEOUS) IMPLANT
TAG SUTURE CLAMP YLW 5PR (MISCELLANEOUS) ×1
TAPE CLOTH SURG 4X10 WHT LF (GAUZE/BANDAGES/DRESSINGS) ×2 IMPLANT
TAPE UMBILICAL COTTON 1/8X30 (MISCELLANEOUS) IMPLANT
TOWEL BLUE STERILE X RAY DET (MISCELLANEOUS) IMPLANT
TOWEL OR 17X26 10 PK STRL BLUE (TOWEL DISPOSABLE) ×4 IMPLANT
TRAY FOLEY CATH 14FRSI W/METER (CATHETERS) ×2 IMPLANT
TRAY LAP CHOLE (CUSTOM PROCEDURE TRAY) IMPLANT
TROCAR BLADELESS OPT 5 75 (ENDOMECHANICALS) ×2 IMPLANT
TROCAR XCEL BLUNT TIP 100MML (ENDOMECHANICALS) IMPLANT
TROCAR XCEL NON-BLD 11X100MML (ENDOMECHANICALS) IMPLANT
TUBE FEEDING 5FR 36IN KANGAROO (TUBING) ×2 IMPLANT
TUBE FEEDING 8FR 16IN STR KANG (MISCELLANEOUS) ×2 IMPLANT
TUBING INSUFFLATION 10FT LAP (TUBING) ×2 IMPLANT
TUNNELER SHEATH ON-Q 16GX12 DP (PAIN MANAGEMENT) ×2 IMPLANT
WATER STERILE IRR 1500ML POUR (IV SOLUTION) IMPLANT
YANKAUER SUCT BULB TIP NO VENT (SUCTIONS) IMPLANT

## 2013-01-28 NOTE — Transfer of Care (Signed)
Immediate Anesthesia Transfer of Care Note  Patient: Sheila Hurst  Procedure(s) Performed: Procedure(s): LAPAROSCOPY DIAGNOSTIC,  WHIPPLE (N/A) WHIPPLE PROCEDURE (N/A)  Patient Location: PACU  Anesthesia Type:General  Level of Consciousness: awake, sedated and patient cooperative  Airway & Oxygen Therapy: Patient Spontanous Breathing and Patient connected to face mask oxygen  Post-op Assessment: Report given to PACU RN and Post -op Vital signs reviewed and stable  Post vital signs: Reviewed and stable  Complications: No apparent anesthesia complications

## 2013-01-28 NOTE — Progress Notes (Signed)
Patient received in unit postop.  Morphine PCA at bedside and attached to patient.  Loading dose administered by RN.  Daughter, Maximino Greenland, stated the she also pressed the PCA button to administer pain medication.  PCA was turned off until patient awake and alert to utilize effectively.  Daughter educated that only patient should use the PCA to administer pain medication due to patient safety and daughter was reassured that RN would regularly assess pain and need for pain medication.  Daughter verbalized understanding using the teach back method.  Kinnie Feil, RN

## 2013-01-28 NOTE — Op Note (Signed)
PREOPERATIVE DIAGNOSIS: Pancreatic adenocarcinoma  POSTOPERATIVE DIAGNOSIS: Same.   PROCEDURES PERFORMED:  Diagnostic laparoscopy  Classic pancreaticoduodenectomy  Placement of biliary and pancreatic stent    SURGEON: Almond Lint, MD   ASSISTANT: Avel Peace, MD and Gaynelle Adu, MD   ANESTHESIA: General and epidural   FINDINGS: 1.5 cm pancreatic head mass. Firm pancreatic tissue. 5 mm common bile duct. 2 mm pancreatic duct  SPECIMENS:  1. Pancreaticoduodenectomy with gallbladder:  2. Portal nodes  3.  Omentum  ESTIMATED BLOOD LOSS: 300 mL.   COMPLICATIONS: None known.   PROCEDURE:   Pt was identified in the holding area and taken to  the operating room, and placed supine on the operating room  table. General anesthesia was induced. The patient's abdomen was  prepped and draped in a sterile fashion, after a Foley catheter was  placed. A time-out was performed according to the surgical safety check  list. When all was correct we continued.   The patient was placed in reverse trendelenburg position and rotated to the right.  The left subcostal margin was anesthetized with local anesthesia.  A 5 mm optiview trocar was placed under direct visualization.  The abdomen was insufflated with carbon dioxide.  The abdomen was examined.  There were some adhesions from prior surgery.  No evidence of metastatic disease was seen.    A midline incision was made from the xiphoid to just below the umbilicus.  The subcutaneous tissues were divided with the Bovie cautery. The peritoneum was entered in the center of the abdomen. Digital retraction was then used to elevate the preperitoneal fat, and  this was taken with the cautery as well. The subcutaneous tissues and  fascia of the muscular layers were taken laterally with the cautery.  Care was taken to protect the underlying viscera.   Bookwalter self-retaining retractor was placed  for visualization. The right colon was taken down off  of the white line  of Toldt and from the retroperitoneum at the hepatic flexure. The porta was identified. The  duodenum was kocherized extensively with blunt dissection and with cautery. The gallbladder was taken off the liver with a combination of blunt dissection and cautery. The cystic duct was clipped with the Hemalock clips. The cystic duct was divided and the gallbladder was passed off.   The common bile duct was skeletonized near the duodenum. A vessel loop was passed around it. There was a replaced right hepatic artery behind the common bile duct.  This was also isolated with a vessel loop.  There was not an apparent GDA artery.  The common hepatic artery were skeletonized. The proper hepatic artery was traced out to make sure that flow was going to both sides of the liver. The replaced right hepatic artery was test clamped with the bulldog, and the common hepatic artery still had a branch going to the right liver with good flow and no signs of ischemia. This was divided with 2-0 silk ties and then clipped. The proper hepatic artery was reflected upward, and the anterior portal vein was exposed.   A Kelly clamp was passed underneath the pancreas at the superior mesenteric vein, and this passed  easily with no signs of tumor involvement.   Attention was then directed to the stomach, and the omentum was taken  off of the stomach at the border of the antrum and the body. The  gastrohepatic ligament was taken down with the harmonic, and care was  taken to make sure there was not a  replaced left hepatic artery in this  location. The stomach was divided with the GIA-75 stapler. The border  of the stomach was oversewn with a 3-0 running PDS suture.   Attention was then directed to the small bowel. Around 10 cm past the  ligament of Treitz it was located, and this was divided with the 75-GIA.  The distal portion of the jejunum was also oversewn with a 3-0 PDS  suture. The fourth portion of the  duodenum was skeletonized with the  harmonic scalpel, taking down all of the mesenteric vessels. The  ligament of Treitz was taken down. The IMV was preserved.  The duodenum was then passed underneath the portal vein.   At this point the Tresa Endo was replaced and the pancreas was divided with the cautery.  There was an intrapancreatic GDA that was clamped, clipped, and suture ligated.  The Bovie was used to coagulate the small bleeders at the border of the pancreas.  The Overholt in combination with the harmonic and locking Weck clips  were then used to take the uncinate process off of the portal vein and  the superior mesenteric artery. There was a small bleeder on the back of the portal vein that was repaired with a 4-0 prolene.  Care was taken not to incorporate the  superior mesenteric artery in the dissection. The specimen was then marked and passed off the table for frozen section margin.   The jejunum was then passed underneath  the SMV, in order to get appropriate lie for the pancreatic and biliary  anastomoses. The more distal portion of the jejunum was pulled up over  the colon, and two 3-0 silks were placed through the posterior border  of the stomach for the gastrojejunostomy. The stomach and the small  bowel were opened, and a GIA-75 was used to create an end-to-end  anastomosis. The open areas of the staple line were examined to ensure  that there was hemostasis. The defect was then closed with a single  layer of running Connell suture of 3-0 PDS. Prior to a complete  closure, the NG tube was passed toward the afferent limb.   The appropriate location for the choledochojejunostomy was identified, and  the small bowel was opened approximately 5 mm. The anastamosis was created with approximately seven 4-0 interrupted PDS sutures.  A segment of 8 Fr pediatric feeding tube was left as a stent. The 2 corner sutures were placed first  and then the posterior layer was done in an  interrupted fashion tying on  the inside. The superior layer was then closed with interrupted sutures as  well.   At this point the frozens returned back as all negative. The pancreatic  anastomosis was then created by opening the muscular layer of the jejunum the length  of the pancreatic parenchyma. The mucosa was opened approximately 3 mm.  The pancreas was firm, but the duct was very small.   A 5 Fr pediatric feeding tube was used as a pancreatic stent. The posterior layer was formed first with 2-0 silk sutures in interrupted fashion.  Several 4-0 vicryls were used to create a mucosa to pancreatic edge approximation. The anterior layer was then oversewn with 2-0 silks to dunk the pancreatic parenchyma.   The areas were then irrigated and then those anastomoses were covered  with Tisseel.   This was allowed to dry. The omentum was laid over the top of the pancreatic and biliary anastamoses.   The abdomen was then irrigated  again and all the laparotomy sponges were removed. A lap count was  performed, which was correct. Two 19-Blake drains were placed, with the  lateral-most drain placed behind the choledochojejunostomy. The medial  Blake drain was placed just anterior and slightly superior to the  pancreaticojejunostomy. The OnQ catheters were placed along the incision in the preperitoneal layer. The fascia was then closed with #1 looped running PDS sutures. The skin was irrigated and then closed with  staples. The wounds were cleaned, dried and dressed with a sterile  dressing.   The patient tolerated the procedure well and was extubated and taken to  PACU in stable condition. Needle and sponge counts were correct x2.

## 2013-01-28 NOTE — Progress Notes (Signed)
CARE MANAGEMENT NOTE 01/28/2013  Patient:  NATHALYA, WOLANSKI   Account Number:  [id number]  Date Initiated:  01/28/2013  Documentation initiated by:  DAVIS,RHONDA  Subjective/Objective Assessment:   pt with cancer of the head of the pancreas.  Whipple procedure done,     Action/Plan:   home when stable   Anticipated DC Date:  01/31/2013   Anticipated DC Plan:  HOME/SELF CARE  In-house referral  NA      DC Planning Services  NA      PAC Choice  NA   Choice offered to / List presented to:  NA   DME arranged  NA      DME agency  NA     HH arranged  NA      HH agency  NA   Status of service:  In process, will continue to follow Medicare Important Message given?  NA - LOS <3 / Initial given by admissions (If response is "NO", the following Medicare IM given date fields will be blank) Date Medicare IM given:   Date Additional Medicare IM given:    Discharge Disposition:    Per UR Regulation:  Reviewed for med. necessity/level of care/duration of stay  If discussed at Long Length of Stay Meetings, dates discussed:    Comments:  [id number] Earlene Plater, RN, BSN, CCM:  CHART REVIEWED AND UPDATED.  Next chart review due on [id number]. NO DISCHARGE NEEDS PRESENT AT THIS TIME. CASE MANAGEMENT [phone]

## 2013-01-28 NOTE — Anesthesia Postprocedure Evaluation (Signed)
  Anesthesia Post-op Note  Patient: Sheila Hurst  Procedure(s) Performed: Procedure(s) (LRB): LAPAROSCOPY DIAGNOSTIC,  WHIPPLE (N/A) WHIPPLE PROCEDURE (N/A)  Patient Location: PACU  Anesthesia Type: General  Level of Consciousness: awake and alert   Airway and Oxygen Therapy: Patient Spontanous Breathing  Post-op Pain: mild  Post-op Assessment: Post-op Vital signs reviewed, Patient's Cardiovascular Status Stable, Respiratory Function Stable, Patent Airway and No signs of Nausea or vomiting  Last Vitals:  Filed Vitals:   01/28/13 1400  BP: 162/70  Pulse: 81  Temp:   Resp: 21    Post-op Vital Signs: stable   Complications: No apparent anesthesia complications

## 2013-01-28 NOTE — Anesthesia Procedure Notes (Addendum)
Procedure Name: Intubation Date/Time: 01/28/2013 7:55 AM Performed by: Paulla Dolly A Pre-anesthesia Checklist: Patient identified, Emergency Drugs available, Suction available, Patient being monitored and Timeout performed Patient Re-evaluated:Patient Re-evaluated prior to inductionOxygen Delivery Method: Circle system utilized Preoxygenation: Pre-oxygenation with 100% oxygen (Small laceration to bottom right lip with insertion of oral airway. ) Intubation Type: IV induction Ventilation: Oral airway inserted - appropriate to patient size and Mask ventilation without difficulty Laryngoscope Size: Mac and 4 Grade View: Grade I Tube type: Oral Tube size: 7.5 mm Number of attempts: 1 (First visualization by Glenna Fellows. No view. Second visualization by Alona Bene CRNA with easy intubation. ) Airway Equipment and Method: Stylet Placement Confirmation: ETT inserted through vocal cords under direct vision,  positive ETCO2 and breath sounds checked- equal and bilateral Secured at: 21 cm Tube secured with: Tape

## 2013-01-28 NOTE — H&P (Signed)
   HISTORY:  Patient is a 72 year old female diagnosed with pancreatic cancer earlier this year. She has undergone neoadjuvant treatment due to abutment of the mesenteric vessels. She had some issues with treatment such as difficulty with taste and appetite. She has had some continued weight loss. She denies nausea and vomiting. She has been doing some light yard work. She has not had any true treatment complications. She is feeling much better off therapy.  Her CT showed decrease of mass size.    PERTINENT REVIEW OF SYSTEMS:  Otherwise negative.   EXAM:  Head: Normocephalic and atraumatic. Pt in good spirits. Thinner than last visit.  Eyes: Conjunctivae are normal. Pupils are equal, round, and reactive to light. No scleral icterus.  Neck: Normal range of motion. Neck supple. No tracheal deviation present. No thyromegaly present.  Resp: No respiratory distress, normal effort.  Abd: Abdomen is soft, non distended and non tender. No masses are palpable. There is no rebound and no guarding.  Neurological: Alert and oriented to person, place, and time. Coordination normal.  Skin: Skin is warm and dry. No rash noted. No diaphoretic. No erythema. No pallor.  Psychiatric: Normal mood and affect. Normal behavior. Judgment and thought content normal .  ASSESSMENT AND PLAN:  Pancreatic adenocarcinoma  Plan dx laparoscopy and probable whipple.  Pt has had good response to treatment.  CA 19-9 decreased significantly.   Pt without new questions today.    Thora Lance, MD  Manus Gunning Bryan Lemma, MD

## 2013-01-29 ENCOUNTER — Encounter (HOSPITAL_COMMUNITY): Payer: Self-pay | Admitting: General Surgery

## 2013-01-29 LAB — CBC
Hemoglobin: 11.1 g/dL — ABNORMAL LOW (ref 12.0–15.0)
MCH: 29 pg (ref 26.0–34.0)
MCHC: 32.9 g/dL (ref 30.0–36.0)
MCV: 88 fL (ref 78.0–100.0)
Platelets: 241 10*3/uL (ref 150–400)
RBC: 3.83 MIL/uL — ABNORMAL LOW (ref 3.87–5.11)

## 2013-01-29 LAB — GLUCOSE, CAPILLARY
Glucose-Capillary: 112 mg/dL — ABNORMAL HIGH (ref 70–99)
Glucose-Capillary: 122 mg/dL — ABNORMAL HIGH (ref 70–99)
Glucose-Capillary: 134 mg/dL — ABNORMAL HIGH (ref 70–99)
Glucose-Capillary: 146 mg/dL — ABNORMAL HIGH (ref 70–99)
Glucose-Capillary: 152 mg/dL — ABNORMAL HIGH (ref 70–99)
Glucose-Capillary: 169 mg/dL — ABNORMAL HIGH (ref 70–99)
Glucose-Capillary: 173 mg/dL — ABNORMAL HIGH (ref 70–99)

## 2013-01-29 LAB — COMPREHENSIVE METABOLIC PANEL
CO2: 30 mEq/L (ref 19–32)
Calcium: 9.4 mg/dL (ref 8.4–10.5)
Creatinine, Ser: 0.69 mg/dL (ref 0.50–1.10)
GFR calc Af Amer: >90 mL/min (ref >90)
GFR calc non Af Amer: 86 mL/min — ABNORMAL LOW (ref >90)
Glucose, Bld: 161 mg/dL — ABNORMAL HIGH (ref 70–99)
Total Protein: 6.3 g/dL (ref 6.0–8.3)

## 2013-01-29 LAB — PROTIME-INR: Prothrombin Time: 13.1 seconds (ref 11.6–15.2)

## 2013-01-29 LAB — PHOSPHORUS: Phosphorus: 3 mg/dL (ref 2.3–4.6)

## 2013-01-29 LAB — APTT: aPTT: 28 seconds (ref 24–37)

## 2013-01-29 MED ORDER — KETOROLAC TROMETHAMINE 15 MG/ML IJ SOLN
15.0000 mg | Freq: Three times a day (TID) | INTRAMUSCULAR | Status: AC
  Administered 2013-01-29 – 2013-01-30 (×3): 15 mg via INTRAVENOUS
  Filled 2013-01-29 (×3): qty 1

## 2013-01-29 NOTE — Progress Notes (Signed)
Nutrition Brief Note  Patient identified on the Malnutrition Screening Tool (MST) Report  Body mass index is 27.99 kg/(m^2). Patient meets criteria for Overweight based on current BMI.   Pt is currently NPO with NG tube in place for suction. Pt states that she lost down to 147 lbs but, has regained weight and is now at her usual body weight of 160 lbs. Pt reports that she was eating well PTA with 3 daily meals and snacks in between. Discussed with pt that smaller more frequent meals/snacks may be needed when diet is advanced due to possibility of getting full fast (s/p whipple procedure). Encouraged adequate protein daily. Labs and medications reviewed.   No further nutrition interventions warranted at this time. If nutrition issues arise, please consult RD.   Ian Malkin RD, LDN Inpatient Clinical Dietitian Pager: [phone] After Hours Pager: [phone]

## 2013-01-29 NOTE — Progress Notes (Signed)
1 Day Post-Op  Subjective: Having some pain.  Otherwise doing well.  Hypertension improved.    Objective: Vital signs in last 24 hours: Temp:  [97.7 F (36.5 C)-99.1 F (37.3 C)] 98.7 F (37.1 C) (05/30 0800) Pulse Rate:  [58-101] 67 (05/30 1315) Resp:  [11-25] 16 (05/30 1315) BP: (125-208)/(49-93) 154/72 mmHg (05/30 1315) SpO2:  [93 %-100 %] 100 % (05/30 1315) Weight:  [163 lb 2.3 oz (74 kg)] 163 lb 2.3 oz (74 kg) (05/29 1440)    Intake/Output from previous day: 05/29 0701 - 05/30 0700 In: 6842 [I.V.:6472; NG/GT:20; IV Piggyback:350] Out: 2562 [Urine:2125; Drains:137; Blood:300] Intake/Output this shift: Total I/O In: 618 [I.V.:618] Out: 610 [Urine:595; Drains:15]  General appearance: alert, cooperative and no distress Resp: breathing comfortably GI: soft, approp tender, non distended.  NGT with bilious output.  drains with serosang output. Extremities: extremities normal, atraumatic, no cyanosis or edema  Lab Results:   Recent Labs  01/29/13 0330  WBC 10.8*  HGB 11.1*  HCT 33.7*  PLT 241   BMET  Recent Labs  01/29/13 0330  NA 132*  K 4.0  CL 94*  CO2 30  GLUCOSE 161*  BUN 12  CREATININE 0.69  CALCIUM 9.4   PT/INR  Recent Labs  01/29/13 0330  LABPROT 13.1  INR 1.00   ABG No results found for this basename: PHART, PCO2, PO2, HCO3,  in the last 72 hours  Studies/Results: No results found.  Anti-infectives: Anti-infectives   Start     Dose/Rate Route Frequency Ordered Stop   01/28/13 1500  cefOXitin (MEFOXIN) 1 g in dextrose 5 % 50 mL IVPB     1 g 100 mL/hr over 30 Minutes Intravenous Every 6 hours 01/28/13 1449 01/29/13 0300   01/28/13 0537  cefOXitin (MEFOXIN) 2 g in dextrose 5 % 50 mL IVPB     2 g 100 mL/hr over 30 Minutes Intravenous On call to O.R. 01/28/13 1610 01/28/13 0756      Assessment/Plan: s/p Procedure(s): LAPAROSCOPY DIAGNOSTIC,  WHIPPLE (N/A) WHIPPLE PROCEDURE (N/A) Continue foley due to strict I&O Stepdown  status HTN - on metoprolol and prn hydralazine Pain - iv tylenol and PCA, onQ   LOS: 1 day    [employer] 01/29/2013

## 2013-01-30 LAB — CBC
HCT: 29.5 % — ABNORMAL LOW (ref 36.0–46.0)
Hemoglobin: 9.3 g/dL — ABNORMAL LOW (ref 12.0–15.0)
MCH: 28.4 pg (ref 26.0–34.0)
MCHC: 31.5 g/dL (ref 30.0–36.0)
RDW: 13.3 % (ref 11.5–15.5)

## 2013-01-30 LAB — COMPREHENSIVE METABOLIC PANEL
Albumin: 2.5 g/dL — ABNORMAL LOW (ref 3.5–5.2)
BUN: 14 mg/dL (ref 6–23)
Calcium: 8.9 mg/dL (ref 8.4–10.5)
GFR calc Af Amer: >90 mL/min (ref >90)
Glucose, Bld: 117 mg/dL — ABNORMAL HIGH (ref 70–99)
Potassium: 4 mEq/L (ref 3.5–5.1)
Total Protein: 5.6 g/dL — ABNORMAL LOW (ref 6.0–8.3)

## 2013-01-30 LAB — GLUCOSE, CAPILLARY
Glucose-Capillary: 109 mg/dL — ABNORMAL HIGH (ref 70–99)
Glucose-Capillary: 131 mg/dL — ABNORMAL HIGH (ref 70–99)
Glucose-Capillary: 139 mg/dL — ABNORMAL HIGH (ref 70–99)

## 2013-01-30 MED ORDER — PHENOL 1.4 % MT LIQD
1.0000 | OROMUCOSAL | Status: DC | PRN
  Administered 2013-01-30: 1 via OROMUCOSAL
  Filled 2013-01-30 (×2): qty 177

## 2013-01-30 NOTE — Progress Notes (Signed)
Patient ID: Sheila Hurst, female   DOB: 20-Aug-1941, 72 y.o.   MRN: [id number] [facility] Progress Note:   2 Days Post-Op  Subjective: Mental status is clear and appropriate.  No specific complaints Objective: Vital signs in last 24 hours: Temp:  [98.4 F (36.9 C)-99.3 F (37.4 C)] 98.8 F (37.1 C) (05/31 0400) Pulse Rate:  [58-73] 67 (05/31 0400) Resp:  [11-24] 20 (05/31 0800) BP: (114-176)/(56-72) 156/60 mmHg (05/31 0400) SpO2:  [98 %-100 %] 98 % (05/31 0800) FiO2 (%):  [98 %] 98 % (05/31 0800) Weight:  [165 lb 12.6 oz (75.2 kg)] 165 lb 12.6 oz (75.2 kg) (05/31 0100)  Intake/Output from previous day: 05/30 0701 - 05/31 0700 In: 2339.5 [I.V.:2319.5; NG/GT:20] Out: 1610 [Urine:1270; Emesis/NG output:175; Drains:165] Intake/Output this shift:    Physical Exam: Work of breathing is not labored.  Drainage is appropriate from JPs.    Lab Results:  Results for orders placed during the hospital encounter of 01/28/13 (from the past 48 hour(s))  GLUCOSE, CAPILLARY     Status: Abnormal   Collection Time    01/28/13  1:45 PM      Result Value Range   Glucose-Capillary 207 (*) 70 - 99 mg/dL  HEMOGLOBIN N8G     Status: None   Collection Time    01/28/13  2:20 PM      Result Value Range   Hemoglobin A1C 5.6  <5.7 %   Comment: (NOTE)                                                                               According to the ADA Clinical Practice Recommendations for 2011, when     HbA1c is used as a screening test:      >=6.5%   Diagnostic of Diabetes Mellitus               (if abnormal result is confirmed)     5.7-6.4%   Increased risk of developing Diabetes Mellitus     References:Diagnosis and Classification of Diabetes Mellitus,Diabetes     Care,2011,34(Suppl 1):S62-S69 and Standards of Medical Care in             Diabetes - 2011,Diabetes Care,2011,34 (Suppl 1):S11-S61.   Mean Plasma Glucose 114  <117 mg/dL  GLUCOSE, CAPILLARY     Status: Abnormal   Collection  Time    01/28/13  4:04 PM      Result Value Range   Glucose-Capillary 244 (*) 70 - 99 mg/dL  GLUCOSE, CAPILLARY     Status: Abnormal   Collection Time    01/28/13  5:14 PM      Result Value Range   Glucose-Capillary 270 (*) 70 - 99 mg/dL  GLUCOSE, CAPILLARY     Status: Abnormal   Collection Time    01/28/13  7:36 PM      Result Value Range   Glucose-Capillary 207 (*) 70 - 99 mg/dL   Comment 1 Notify RN    GLUCOSE, CAPILLARY     Status: Abnormal   Collection Time    01/28/13 11:38 PM      Result Value Range   Glucose-Capillary 169 (*) 70 - 99 mg/dL  Comment 1 Notify RN    GLUCOSE, CAPILLARY     Status: Abnormal   Collection Time    01/29/13  3:25 AM      Result Value Range   Glucose-Capillary 173 (*) 70 - 99 mg/dL   Comment 1 Notify RN    CBC     Status: Abnormal   Collection Time    01/29/13  3:30 AM      Result Value Range   WBC 10.8 (*) 4.0 - 10.5 K/uL   RBC 3.83 (*) 3.87 - 5.11 MIL/uL   Hemoglobin 11.1 (*) 12.0 - 15.0 g/dL   HCT 95.6 (*) 21.3 - 08.6 %   MCV 88.0  78.0 - 100.0 fL   MCH 29.0  26.0 - 34.0 pg   MCHC 32.9  30.0 - 36.0 g/dL   RDW 57.8  46.9 - 62.9 %   Platelets 241  150 - 400 K/uL  COMPREHENSIVE METABOLIC PANEL     Status: Abnormal   Collection Time    01/29/13  3:30 AM      Result Value Range   Sodium 132 (*) 135 - 145 mEq/L   Potassium 4.0  3.5 - 5.1 mEq/L   Chloride 94 (*) 96 - 112 mEq/L   CO2 30  19 - 32 mEq/L   Glucose, Bld 161 (*) 70 - 99 mg/dL   BUN 12  6 - 23 mg/dL   Creatinine, Ser 5.28  0.50 - 1.10 mg/dL   Calcium 9.4  8.4 - 41.3 mg/dL   Total Protein 6.3  6.0 - 8.3 g/dL   Albumin 3.0 (*) 3.5 - 5.2 g/dL   AST 40 (*) 0 - 37 U/L   ALT 35  0 - 35 U/L   Alkaline Phosphatase 108  39 - 117 U/L   Total Bilirubin 0.4  0.3 - 1.2 mg/dL   GFR calc non Af Amer 86 (*) >90 mL/min   GFR calc Af Amer >90  >90 mL/min   Comment:            The eGFR has been calculated     using the CKD EPI equation.     This calculation has not been     validated  in all clinical     situations.     eGFR's persistently     <90 mL/min signify     possible Chronic Kidney Disease.  MAGNESIUM     Status: None   Collection Time    01/29/13  3:30 AM      Result Value Range   Magnesium 1.6  1.5 - 2.5 mg/dL  PHOSPHORUS     Status: None   Collection Time    01/29/13  3:30 AM      Result Value Range   Phosphorus 3.0  2.3 - 4.6 mg/dL  APTT     Status: None   Collection Time    01/29/13  3:30 AM      Result Value Range   aPTT 28  24 - 37 seconds  PROTIME-INR     Status: None   Collection Time    01/29/13  3:30 AM      Result Value Range   Prothrombin Time 13.1  11.6 - 15.2 seconds   INR 1.00  0.00 - 1.49  GLUCOSE, CAPILLARY     Status: Abnormal   Collection Time    01/29/13  8:02 AM      Result Value Range   Glucose-Capillary 152 (*) 70 -  99 mg/dL  GLUCOSE, CAPILLARY     Status: Abnormal   Collection Time    01/29/13 11:28 AM      Result Value Range   Glucose-Capillary 146 (*) 70 - 99 mg/dL  GLUCOSE, CAPILLARY     Status: Abnormal   Collection Time    01/29/13  4:12 PM      Result Value Range   Glucose-Capillary 134 (*) 70 - 99 mg/dL  GLUCOSE, CAPILLARY     Status: Abnormal   Collection Time    01/29/13  8:06 PM      Result Value Range   Glucose-Capillary 112 (*) 70 - 99 mg/dL  GLUCOSE, CAPILLARY     Status: Abnormal   Collection Time    01/29/13 11:33 PM      Result Value Range   Glucose-Capillary 122 (*) 70 - 99 mg/dL  CBC     Status: Abnormal   Collection Time    01/30/13  3:53 AM      Result Value Range   WBC 8.4  4.0 - 10.5 K/uL   RBC 3.27 (*) 3.87 - 5.11 MIL/uL   Hemoglobin 9.3 (*) 12.0 - 15.0 g/dL   HCT 16.1 (*) 09.6 - 04.5 %   MCV 90.2  78.0 - 100.0 fL   MCH 28.4  26.0 - 34.0 pg   MCHC 31.5  30.0 - 36.0 g/dL   RDW 40.9  81.1 - 91.4 %   Platelets 169  150 - 400 K/uL   Comment: DELTA CHECK NOTED     REPEATED TO VERIFY     SPECIMEN CHECKED FOR CLOTS  COMPREHENSIVE METABOLIC PANEL     Status: Abnormal   Collection  Time    01/30/13  3:53 AM      Result Value Range   Sodium 132 (*) 135 - 145 mEq/L   Potassium 4.0  3.5 - 5.1 mEq/L   Chloride 97  96 - 112 mEq/L   CO2 31  19 - 32 mEq/L   Glucose, Bld 117 (*) 70 - 99 mg/dL   BUN 14  6 - 23 mg/dL   Creatinine, Ser 7.82  0.50 - 1.10 mg/dL   Calcium 8.9  8.4 - 95.6 mg/dL   Total Protein 5.6 (*) 6.0 - 8.3 g/dL   Albumin 2.5 (*) 3.5 - 5.2 g/dL   AST 32  0 - 37 U/L   ALT 27  0 - 35 U/L   Alkaline Phosphatase 85  39 - 117 U/L   Total Bilirubin 0.4  0.3 - 1.2 mg/dL   GFR calc non Af Amer 83 (*) >90 mL/min   GFR calc Af Amer >90  >90 mL/min   Comment:            The eGFR has been calculated     using the CKD EPI equation.     This calculation has not been     validated in all clinical     situations.     eGFR's persistently     <90 mL/min signify     possible Chronic Kidney Disease.  GLUCOSE, CAPILLARY     Status: Abnormal   Collection Time    01/30/13  3:53 AM      Result Value Range   Glucose-Capillary 109 (*) 70 - 99 mg/dL    Radiology/Results: No results found.  Anti-infectives: Anti-infectives   Start     Dose/Rate Route Frequency Ordered Stop   01/28/13 1500  cefOXitin (MEFOXIN) 1 g in dextrose  5 % 50 mL IVPB     1 g 100 mL/hr over 30 Minutes Intravenous Every 6 hours 01/28/13 1449 01/29/13 0300   01/28/13 0537  cefOXitin (MEFOXIN) 2 g in dextrose 5 % 50 mL IVPB     2 g 100 mL/hr over 30 Minutes Intravenous On call to O.R. 01/28/13 0537 01/28/13 0756      Assessment/Plan: Problem List: Patient Active Problem List   Diagnosis Date Noted  . 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 Hyperlipidemia   . Arthritis   . GERD (gastroesophageal reflux disease)   . Hypertension   . 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 Pancreatic mass 09/15/2012    Doing very well thus far.  Cooperating with nursing in getting up and about.   2 Days Post-Op    LOS: 2 days   Matt B. Daphine Deutscher, MD, [facility] [phone] beeper [phone]  01/30/2013 8:20 AM

## 2013-01-31 LAB — CBC
HCT: 30.6 % — ABNORMAL LOW (ref 36.0–46.0)
MCH: 29 pg (ref 26.0–34.0)
MCHC: 32.4 g/dL (ref 30.0–36.0)
MCV: 89.7 fL (ref 78.0–100.0)
Platelets: 166 10*3/uL (ref 150–400)
RDW: 12.7 % (ref 11.5–15.5)

## 2013-01-31 LAB — COMPREHENSIVE METABOLIC PANEL
Albumin: 2.5 g/dL — ABNORMAL LOW (ref 3.5–5.2)
BUN: 6 mg/dL (ref 6–23)
Calcium: 9 mg/dL (ref 8.4–10.5)
Chloride: 98 mEq/L (ref 96–112)
Creatinine, Ser: 0.56 mg/dL (ref 0.50–1.10)
Total Bilirubin: 0.4 mg/dL (ref 0.3–1.2)
Total Protein: 6 g/dL (ref 6.0–8.3)

## 2013-01-31 LAB — GLUCOSE, CAPILLARY
Glucose-Capillary: 153 mg/dL — ABNORMAL HIGH (ref 70–99)
Glucose-Capillary: 116 mg/dL — ABNORMAL HIGH (ref 70–99)
Glucose-Capillary: 152 mg/dL — ABNORMAL HIGH (ref 70–99)
Glucose-Capillary: 133 mg/dL — ABNORMAL HIGH (ref 70–99)

## 2013-01-31 NOTE — Progress Notes (Signed)
Pt transferred to Room 1502, report called to [facility]. Pt alert and oriented x 4 and in no acute distress.

## 2013-01-31 NOTE — Progress Notes (Signed)
Patient ID: Sheila Hurst, female   DOB: 24-Apr-1941, 71 y.o.   MRN: [id number] [facility] Surgery Progress Note:   3 Days Post-Op  Subjective: Mental status is clear.  Transferred to [address].   Objective: Vital signs in last 24 hours: Temp:  [98.4 F (36.9 C)-99.9 F (37.7 C)] 98.6 F (37 C) (06/01 0607) Pulse Rate:  [66-75] 66 (06/01 0607) Resp:  [17-23] 18 (06/01 0607) BP: (136-173)/(58-82) 173/82 mmHg (06/01 0607) SpO2:  [92 %-100 %] 100 % (06/01 0607) FiO2 (%):  [99 %-100 %] 100 % (05/31 2000)  Intake/Output from previous day: 05/31 0701 - 06/01 0700 In: 1759.5 [I.V.:1619.5; NG/GT:90] Out: 3065 [Urine:2870; Emesis/NG output:100; Drains:95] Intake/Output this shift:    Physical Exam: Work of breathing is not labored.  NG in place with scant green drainage in the tube.  Lungs clear.  Heart sinus rhythm.  Abdomen flat.  On Q in place.  JPs serosanguinous-no bile. Nontender.  No flatus  Lab Results:  Results for orders placed during the hospital encounter of 01/28/13 (from the past 48 hour(s))  GLUCOSE, CAPILLARY     Status: Abnormal   Collection Time    01/29/13 11:28 AM      Result Value Range   Glucose-Capillary 146 (*) 70 - 99 mg/dL  GLUCOSE, CAPILLARY     Status: Abnormal   Collection Time    01/29/13  4:12 PM      Result Value Range   Glucose-Capillary 134 (*) 70 - 99 mg/dL  GLUCOSE, CAPILLARY     Status: Abnormal   Collection Time    01/29/13  8:06 PM      Result Value Range   Glucose-Capillary 112 (*) 70 - 99 mg/dL  GLUCOSE, CAPILLARY     Status: Abnormal   Collection Time    01/29/13 11:33 PM      Result Value Range   Glucose-Capillary 122 (*) 70 - 99 mg/dL  CBC     Status: Abnormal   Collection Time    01/30/13  3:53 AM      Result Value Range   WBC 8.4  4.0 - 10.5 K/uL   RBC 3.27 (*) 3.87 - 5.11 MIL/uL   Hemoglobin 9.3 (*) 12.0 - 15.0 g/dL   HCT 14.7 (*) 82.9 - 56.2 %   MCV 90.2  78.0 - 100.0 fL   MCH 28.4  26.0 - 34.0 pg   MCHC 31.5  30.0 -  36.0 g/dL   RDW 13.0  86.5 - 78.4 %   Platelets 169  150 - 400 K/uL   Comment: DELTA CHECK NOTED     REPEATED TO VERIFY     SPECIMEN CHECKED FOR CLOTS  COMPREHENSIVE METABOLIC PANEL     Status: Abnormal   Collection Time    01/30/13  3:53 AM      Result Value Range   Sodium 132 (*) 135 - 145 mEq/L   Potassium 4.0  3.5 - 5.1 mEq/L   Chloride 97  96 - 112 mEq/L   CO2 31  19 - 32 mEq/L   Glucose, Bld 117 (*) 70 - 99 mg/dL   BUN 14  6 - 23 mg/dL   Creatinine, Ser 6.96  0.50 - 1.10 mg/dL   Calcium 8.9  8.4 - 29.5 mg/dL   Total Protein 5.6 (*) 6.0 - 8.3 g/dL   Albumin 2.5 (*) 3.5 - 5.2 g/dL   AST 32  0 - 37 U/L   ALT 27  0 -  35 U/L   Alkaline Phosphatase 85  39 - 117 U/L   Total Bilirubin 0.4  0.3 - 1.2 mg/dL   GFR calc non Af Amer 83 (*) >90 mL/min   GFR calc Af Amer >90  >90 mL/min   Comment:            The eGFR has been calculated     using the CKD EPI equation.     This calculation has not been     validated in all clinical     situations.     eGFR's persistently     <90 mL/min signify     possible Chronic Kidney Disease.  GLUCOSE, CAPILLARY     Status: Abnormal   Collection Time    01/30/13  3:53 AM      Result Value Range   Glucose-Capillary 109 (*) 70 - 99 mg/dL  GLUCOSE, CAPILLARY     Status: Abnormal   Collection Time    01/30/13  7:44 AM      Result Value Range   Glucose-Capillary 131 (*) 70 - 99 mg/dL  GLUCOSE, CAPILLARY     Status: Abnormal   Collection Time    01/30/13 11:49 AM      Result Value Range   Glucose-Capillary 103 (*) 70 - 99 mg/dL  GLUCOSE, CAPILLARY     Status: Abnormal   Collection Time    01/30/13  4:17 PM      Result Value Range   Glucose-Capillary 120 (*) 70 - 99 mg/dL  GLUCOSE, CAPILLARY     Status: Abnormal   Collection Time    01/30/13  7:33 PM      Result Value Range   Glucose-Capillary 139 (*) 70 - 99 mg/dL   Comment 1 Documented in Chart     Comment 2 Notify RN    GLUCOSE, CAPILLARY     Status: Abnormal   Collection Time     01/31/13  1:13 AM      Result Value Range   Glucose-Capillary 145 (*) 70 - 99 mg/dL   Comment 1 Notify RN    GLUCOSE, CAPILLARY     Status: Abnormal   Collection Time    01/31/13  4:33 AM      Result Value Range   Glucose-Capillary 153 (*) 70 - 99 mg/dL   Comment 1 Notify RN    CBC     Status: Abnormal   Collection Time    01/31/13  5:45 AM      Result Value Range   WBC 8.0  4.0 - 10.5 K/uL   RBC 3.41 (*) 3.87 - 5.11 MIL/uL   Hemoglobin 9.9 (*) 12.0 - 15.0 g/dL   HCT 69.6 (*) 29.5 - 28.4 %   MCV 89.7  78.0 - 100.0 fL   MCH 29.0  26.0 - 34.0 pg   MCHC 32.4  30.0 - 36.0 g/dL   RDW 13.2  44.0 - 10.2 %   Platelets 166  150 - 400 K/uL  COMPREHENSIVE METABOLIC PANEL     Status: Abnormal   Collection Time    01/31/13  5:45 AM      Result Value Range   Sodium 135  135 - 145 mEq/L   Potassium 3.7  3.5 - 5.1 mEq/L   Chloride 98  96 - 112 mEq/L   CO2 32  19 - 32 mEq/L   Glucose, Bld 156 (*) 70 - 99 mg/dL   BUN 6  6 - 23 mg/dL  Creatinine, Ser 0.56  0.50 - 1.10 mg/dL   Calcium 9.0  8.4 - 16.1 mg/dL   Total Protein 6.0  6.0 - 8.3 g/dL   Albumin 2.5 (*) 3.5 - 5.2 g/dL   AST 25  0 - 37 U/L   ALT 22  0 - 35 U/L   Alkaline Phosphatase 91  39 - 117 U/L   Total Bilirubin 0.4  0.3 - 1.2 mg/dL   GFR calc non Af Amer >90  >90 mL/min   GFR calc Af Amer >90  >90 mL/min   Comment:            The eGFR has been calculated     using the CKD EPI equation.     This calculation has not been     validated in all clinical     situations.     eGFR's persistently     <90 mL/min signify     possible Chronic Kidney Disease.  GLUCOSE, CAPILLARY     Status: Abnormal   Collection Time    01/31/13  7:19 AM      Result Value Range   Glucose-Capillary 116 (*) 70 - 99 mg/dL    Radiology/Results: No results found.  Anti-infectives: Anti-infectives   Start     Dose/Rate Route Frequency Ordered Stop   01/28/13 1500  cefOXitin (MEFOXIN) 1 g in dextrose 5 % 50 mL IVPB     1 g 100 mL/hr over 30  Minutes Intravenous Every 6 hours 01/28/13 1449 01/29/13 0300   01/28/13 0537  cefOXitin (MEFOXIN) 2 g in dextrose 5 % 50 mL IVPB     2 g 100 mL/hr over 30 Minutes Intravenous On call to O.R. 01/28/13 0537 01/28/13 0756      Assessment/Plan: Problem List: Patient Active Problem List   Diagnosis Date Noted  . 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 Hyperlipidemia   . Arthritis   . GERD (gastroesophageal reflux disease)   . Hypertension   . 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 Pancreatic mass 09/15/2012    Stable with postop ileus.  Appropriate drainage.   3 Days Post-Op    LOS: 3 days   Matt B. Daphine Deutscher, MD, [facility] [phone] beeper [phone]  01/31/2013 9:10 AM

## 2013-02-01 LAB — CBC
MCH: 29.5 pg (ref 26.0–34.0)
MCHC: 33.4 g/dL (ref 30.0–36.0)
Platelets: 201 10*3/uL (ref 150–400)
RBC: 3.42 MIL/uL — ABNORMAL LOW (ref 3.87–5.11)

## 2013-02-01 LAB — COMPREHENSIVE METABOLIC PANEL
ALT: 17 U/L (ref 0–35)
AST: 18 U/L (ref 0–37)
Albumin: 2.3 g/dL — ABNORMAL LOW (ref 3.5–5.2)
Calcium: 9 mg/dL (ref 8.4–10.5)
Sodium: 132 mEq/L — ABNORMAL LOW (ref 135–145)
Total Protein: 5.8 g/dL — ABNORMAL LOW (ref 6.0–8.3)

## 2013-02-01 LAB — TYPE AND SCREEN
Unit division: 0
Unit division: 0
Unit division: 0

## 2013-02-01 LAB — GLUCOSE, CAPILLARY
Glucose-Capillary: 103 mg/dL — ABNORMAL HIGH (ref 70–99)
Glucose-Capillary: 119 mg/dL — ABNORMAL HIGH (ref 70–99)
Glucose-Capillary: 135 mg/dL — ABNORMAL HIGH (ref 70–99)
Glucose-Capillary: 164 mg/dL — ABNORMAL HIGH (ref 70–99)

## 2013-02-01 NOTE — Progress Notes (Signed)
Patient given oral care multiple times this am, mouth rinse used by patient, face washed but patient requested to await a bit for bath.  Daughters in room visiting with patient

## 2013-02-01 NOTE — Progress Notes (Signed)
Patient ID: LAKENA SPARLIN, female   DOB: [birth year], 72 y.o.   MRN: [id number] [facility] Surgery Progress Note:   4 Days Post-Op   Subjective: Doing well other than pain.    Objective: Vital signs in last 24 hours: Temp:  [98 F (36.7 C)-98.5 F (36.9 C)] 98 F (36.7 C) (06/02 0500) Pulse Rate:  [74-80] 79 (06/02 0500) Resp:  [18-24] 19 (06/02 0749) BP: (124-167)/(68-85) 124/68 mmHg (06/02 0500) SpO2:  [98 %-100 %] 98 % (06/02 0749) FiO2 (%):  [36 %] 36 % (06/02 0441)  Intake/Output from previous day: 06/01 0701 - 06/02 0700 In: 1785 [I.V.:1500; NG/GT:160] Out: 400 [Urine:400] Intake/Output this shift:    Physical Exam: Work of breathing is not labored.  NG in place with scant green drainage in the tube.  Lungs clear.  Heart sinus rhythm.  Abdomen flat.  On Q in place, empty.  JPs serosanguinous-no bile. Nontender.  No flatus  Lab Results:  Results for orders placed during the hospital encounter of 01/28/13 (from the past 48 hour(s))  GLUCOSE, CAPILLARY     Status: Abnormal   Collection Time    01/30/13 11:49 AM      Result Value Range   Glucose-Capillary 103 (*) 70 - 99 mg/dL  GLUCOSE, CAPILLARY     Status: Abnormal   Collection Time    01/30/13  4:17 PM      Result Value Range   Glucose-Capillary 120 (*) 70 - 99 mg/dL  GLUCOSE, CAPILLARY     Status: Abnormal   Collection Time    01/30/13  7:33 PM      Result Value Range   Glucose-Capillary 139 (*) 70 - 99 mg/dL   Comment 1 Documented in Chart     Comment 2 Notify RN    GLUCOSE, CAPILLARY     Status: Abnormal   Collection Time    01/31/13  1:13 AM      Result Value Range   Glucose-Capillary 145 (*) 70 - 99 mg/dL   Comment 1 Notify RN    GLUCOSE, CAPILLARY     Status: Abnormal   Collection Time    01/31/13  4:33 AM      Result Value Range   Glucose-Capillary 153 (*) 70 - 99 mg/dL   Comment 1 Notify RN    CBC     Status: Abnormal   Collection Time    01/31/13  5:45 AM      Result Value Range   WBC  8.0  4.0 - 10.5 K/uL   RBC 3.41 (*) 3.87 - 5.11 MIL/uL   Hemoglobin 9.9 (*) 12.0 - 15.0 g/dL   HCT 11.9 (*) 14.7 - 82.9 %   MCV 89.7  78.0 - 100.0 fL   MCH 29.0  26.0 - 34.0 pg   MCHC 32.4  30.0 - 36.0 g/dL   RDW 56.2  13.0 - 86.5 %   Platelets 166  150 - 400 K/uL  COMPREHENSIVE METABOLIC PANEL     Status: Abnormal   Collection Time    01/31/13  5:45 AM      Result Value Range   Sodium 135  135 - 145 mEq/L   Potassium 3.7  3.5 - 5.1 mEq/L   Chloride 98  96 - 112 mEq/L   CO2 32  19 - 32 mEq/L   Glucose, Bld 156 (*) 70 - 99 mg/dL   BUN 6  6 - 23 mg/dL   Creatinine, Ser 7.84  0.50 - 1.10  mg/dL   Calcium 9.0  8.4 - 98.1 mg/dL   Total Protein 6.0  6.0 - 8.3 g/dL   Albumin 2.5 (*) 3.5 - 5.2 g/dL   AST 25  0 - 37 U/L   ALT 22  0 - 35 U/L   Alkaline Phosphatase 91  39 - 117 U/L   Total Bilirubin 0.4  0.3 - 1.2 mg/dL   GFR calc non Af Amer >90  >90 mL/min   GFR calc Af Amer >90  >90 mL/min   Comment:            The eGFR has been calculated     using the CKD EPI equation.     This calculation has not been     validated in all clinical     situations.     eGFR's persistently     <90 mL/min signify     possible Chronic Kidney Disease.  GLUCOSE, CAPILLARY     Status: Abnormal   Collection Time    01/31/13  7:19 AM      Result Value Range   Glucose-Capillary 116 (*) 70 - 99 mg/dL  GLUCOSE, CAPILLARY     Status: Abnormal   Collection Time    01/31/13 11:12 AM      Result Value Range   Glucose-Capillary 152 (*) 70 - 99 mg/dL  GLUCOSE, CAPILLARY     Status: Abnormal   Collection Time    01/31/13  4:20 PM      Result Value Range   Glucose-Capillary 139 (*) 70 - 99 mg/dL  GLUCOSE, CAPILLARY     Status: Abnormal   Collection Time    01/31/13  9:04 PM      Result Value Range   Glucose-Capillary 133 (*) 70 - 99 mg/dL   Comment 1 Notify RN    GLUCOSE, CAPILLARY     Status: Abnormal   Collection Time    01/31/13 11:55 PM      Result Value Range   Glucose-Capillary 135 (*) 70 -  99 mg/dL  GLUCOSE, CAPILLARY     Status: Abnormal   Collection Time    02/01/13  3:55 AM      Result Value Range   Glucose-Capillary 119 (*) 70 - 99 mg/dL   Comment 1 Notify RN    CBC     Status: Abnormal   Collection Time    02/01/13  4:50 AM      Result Value Range   WBC 8.9  4.0 - 10.5 K/uL   RBC 3.42 (*) 3.87 - 5.11 MIL/uL   Hemoglobin 10.1 (*) 12.0 - 15.0 g/dL   HCT 19.1 (*) 47.8 - 29.5 %   MCV 88.3  78.0 - 100.0 fL   MCH 29.5  26.0 - 34.0 pg   MCHC 33.4  30.0 - 36.0 g/dL   RDW 62.1  30.8 - 65.7 %   Platelets 201  150 - 400 K/uL  COMPREHENSIVE METABOLIC PANEL     Status: Abnormal   Collection Time    02/01/13  4:50 AM      Result Value Range   Sodium 132 (*) 135 - 145 mEq/L   Potassium 3.6  3.5 - 5.1 mEq/L   Chloride 97  96 - 112 mEq/L   CO2 29  19 - 32 mEq/L   Glucose, Bld 140 (*) 70 - 99 mg/dL   BUN 6  6 - 23 mg/dL   Creatinine, Ser 8.46  0.50 - 1.10 mg/dL  Calcium 9.0  8.4 - 10.5 mg/dL   Total Protein 5.8 (*) 6.0 - 8.3 g/dL   Albumin 2.3 (*) 3.5 - 5.2 g/dL   AST 18  0 - 37 U/L   ALT 17  0 - 35 U/L   Alkaline Phosphatase 88  39 - 117 U/L   Total Bilirubin 0.4  0.3 - 1.2 mg/dL   GFR calc non Af Amer >90  >90 mL/min   GFR calc Af Amer >90  >90 mL/min   Comment:            The eGFR has been calculated     using the CKD EPI equation.     This calculation has not been     validated in all clinical     situations.     eGFR's persistently     <90 mL/min signify     possible Chronic Kidney Disease.  GLUCOSE, CAPILLARY     Status: Abnormal   Collection Time    02/01/13  7:23 AM      Result Value Range   Glucose-Capillary 164 (*) 70 - 99 mg/dL   Comment 1 Notify RN      Radiology/Results: No results found.  Anti-infectives: Anti-infectives   Start     Dose/Rate Route Frequency Ordered Stop   01/28/13 1500  cefOXitin (MEFOXIN) 1 g in dextrose 5 % 50 mL IVPB     1 g 100 mL/hr over 30 Minutes Intravenous Every 6 hours 01/28/13 1449 01/29/13 0300   01/28/13  0537  cefOXitin (MEFOXIN) 2 g in dextrose 5 % 50 mL IVPB     2 g 100 mL/hr over 30 Minutes Intravenous On call to O.R. 01/28/13 0537 01/28/13 0756      Assessment/Plan: Problem List: Patient Active Problem List   Diagnosis Date Noted  . 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 Hyperlipidemia   . Arthritis   . GERD (gastroesophageal reflux disease)   . Hypertension   . Pancreatic mass 09/15/2012    Stable with postop ileus.    D/C NGT, OnQ. PT consult Sips of clears. Hydralazine, metoprolol for HTN. protonix for reflux/gi prophylaxis. 3 Days Post-Op    LOS: 4 days    [facility]  [phone]  02/01/2013 8:11 AM

## 2013-02-01 NOTE — Progress Notes (Signed)
Bathed/gown changed returned to bed  Tolerated well, encouraged to use the PCA for pain.

## 2013-02-01 NOTE — Evaluation (Signed)
Physical Therapy Evaluation Patient Details Name: Sheila Hurst MRN: [id number] DOB: Aug 16, 1941 Today's Date: 02/01/2013 Time: 0865-7846 PT Time Calculation (min): 19 min  PT Assessment / Plan / Recommendation Clinical Impression  Pt presents with newly diagnosed pancreatic adenocarinoma and is s/p whipple procedure.  Tolerated OOB and ambulation to/from restroom at [facility] assist for safety and max cues for correct use of RW.  Pt will benefit from skilled PT in acute venue to address deficits.  PT recommends [facility] for follow up at D/C to maximize pts safety and function.      PT Assessment  Patient needs continued PT services    Follow Up Recommendations  SNF;Supervision/Assistance - 24 hour    Does the patient have the potential to tolerate intense rehabilitation      Barriers to Discharge Decreased caregiver support      Equipment Recommendations  Rolling walker with 5" wheels    Recommendations for Other Services OT consult   Frequency Min 3X/week    Precautions / Restrictions Precautions Precautions: Fall Restrictions Weight Bearing Restrictions: No   Pertinent Vitals/Pain Pt states 6/10 pain, encouraged PCA as needed.       Mobility  Bed Mobility Bed Mobility: Supine to Sit Supine to Sit: 4: Min guard;With rails;HOB elevated Details for Bed Mobility Assistance: Min/guard for safety of trunk when sitting with cues for hand placement on rail to self assist.  Transfers Transfers: Sit to Stand;Stand to Sit Sit to Stand: 4: Min assist;With upper extremity assist;From bed;From toilet Stand to Sit: 4: Min assist;With upper extremity assist;With armrests;To chair/3-in-1;To toilet Details for Transfer Assistance: Assist to rise, steady and ensure controlled descent with cues for hand placement and safety when sitting/standing.  Ambulation/Gait Ambulation/Gait Assistance: 3: Mod assist Ambulation Distance (Feet): 12 Feet (x 2) Assistive device: Rolling  walker Ambulation/Gait Assistance Details: Assist to steady throughout due to noted B quad weakness with cues for maintaining position inside of RW and some assist to negotiate RW in restroom.  Noted pt to have very short shuffled gait pattern.  Gait Pattern: Step-to pattern;Decreased stride length;Narrow base of support;Shuffle;Trunk flexed Gait velocity: decreased    Exercises     PT Diagnosis: Difficulty walking;Generalized weakness;Acute pain  PT Problem List: Decreased strength;Decreased activity tolerance;Decreased balance;Decreased mobility;Decreased coordination;Decreased knowledge of use of DME;Decreased safety awareness;Decreased knowledge of precautions;Pain PT Treatment Interventions: DME instruction;Gait training;Functional mobility training;Therapeutic activities;Therapeutic exercise;Balance training;Patient/family education   PT Goals Acute Rehab PT Goals PT Goal Formulation: With patient Time For Goal Achievement: 02/15/13 Potential to Achieve Goals: Good Pt will go Supine/Side to Sit: with supervision PT Goal: Supine/Side to Sit - Progress: Goal set today Pt will go Sit to Supine/Side: with supervision PT Goal: Sit to Supine/Side - Progress: Goal set today Pt will go Sit to Stand: with supervision PT Goal: Sit to Stand - Progress: Goal set today Pt will go Stand to Sit: with supervision PT Goal: Stand to Sit - Progress: Goal set today Pt will Ambulate: 51 - 150 feet;with supervision;with least restrictive assistive device PT Goal: Ambulate - Progress: Goal set today Pt will Perform Home Exercise Program: with supervision, verbal cues required/provided PT Goal: Perform Home Exercise Program - Progress: Goal set today  Visit Information  Last PT Received On: 02/01/13 Assistance Needed: +1    Subjective Data  Subjective: "okay" Patient Stated Goal: n/a   Prior Functioning  Home Living Lives With: Alone Available Help at Discharge:  (Pt states she would have to  see if someone could stay  with h) Type of Home: House Home Access: Stairs to enter [employer] of Steps: 2 Entrance Stairs-Rails: Right Home Layout: One level Bathroom Shower/Tub: Engineer, manufacturing systems: Handicapped height Home Adaptive Equipment: None Prior Function Level of Independence: Independent Able to Take Stairs?: Yes Driving: Yes Vocation: Retired Musician: No difficulties    Copywriter, advertising Arousal/Alertness: Awake/alert Behavior During Therapy: WFL for tasks assessed/performed Overall Cognitive Status: Within Functional Limits for tasks assessed    Extremity/Trunk Assessment Right Lower Extremity Assessment RLE ROM/Strength/Tone: Deficits RLE ROM/Strength/Tone Deficits: Pt with generalized weakness, grossly 3/5 per functional observation.  RLE Sensation: WFL - Light Touch Left Lower Extremity Assessment LLE ROM/Strength/Tone: Deficits LLE ROM/Strength/Tone Deficits: Pt with generalized weakness, grossly 3/5 per functional observation.  LLE Sensation: WFL - Light Touch Trunk Assessment Trunk Assessment: Kyphotic   Balance    End of Session PT - End of Session Activity Tolerance: Patient limited by fatigue;Patient limited by pain Patient left: in chair;with call bell/phone within reach Nurse Communication: Mobility status  GP     Vista Deck 02/01/2013, 2:15 PM

## 2013-02-02 LAB — CBC
HCT: 29.6 % — ABNORMAL LOW (ref 36.0–46.0)
Hemoglobin: 9.8 g/dL — ABNORMAL LOW (ref 12.0–15.0)
MCH: 29.3 pg (ref 26.0–34.0)
MCHC: 33.1 g/dL (ref 30.0–36.0)

## 2013-02-02 LAB — GLUCOSE, CAPILLARY
Glucose-Capillary: 106 mg/dL — ABNORMAL HIGH (ref 70–99)
Glucose-Capillary: 109 mg/dL — ABNORMAL HIGH (ref 70–99)
Glucose-Capillary: 102 mg/dL — ABNORMAL HIGH (ref 70–99)
Glucose-Capillary: 104 mg/dL — ABNORMAL HIGH (ref 70–99)
Glucose-Capillary: 135 mg/dL — ABNORMAL HIGH (ref 70–99)

## 2013-02-02 LAB — COMPREHENSIVE METABOLIC PANEL
Alkaline Phosphatase: 115 U/L (ref 39–117)
BUN: 9 mg/dL (ref 6–23)
GFR calc Af Amer: >90 mL/min (ref >90)
GFR calc non Af Amer: 86 mL/min — ABNORMAL LOW (ref >90)
Glucose, Bld: 118 mg/dL — ABNORMAL HIGH (ref 70–99)
Potassium: 4.1 mEq/L (ref 3.5–5.1)
Total Protein: 5.9 g/dL — ABNORMAL LOW (ref 6.0–8.3)

## 2013-02-02 NOTE — Progress Notes (Signed)
Patient ID: Sheila Hurst, female   DOB: 15-Mar-1941, 72 y.o.   MRN: [id number]  Subjective: NGT removed yesterday.  No N/V.  No flatus yet.    Objective: Vital signs in last 24 hours: Temp:  [97.7 F (36.5 C)-99.2 F (37.3 C)] 99.2 F (37.3 C) (06/03 0518) Pulse Rate:  [67-76] 67 (06/03 0518) Resp:  [16-22] 16 (06/03 0518) BP: (123-145)/(72-78) 145/78 mmHg (06/03 0518) SpO2:  [10 %-100 %] 100 % (06/03 0518)  Intake/Output from previous day: 06/02 0701 - 06/03 0700 In: 3202 [I.V.:3000; NG/GT:100] Out: 200 [Emesis/NG output:200] Intake/Output this shift:    Physical Exam: Work of breathing is not labored.     Abdomen flat.   On Q in place, empty.  JPs serosanguinous-no bile. Nontender.  No flatus  Lab Results:  Results for orders placed during the hospital encounter of 01/28/13 (from the past 48 hour(s))  GLUCOSE, CAPILLARY     Status: Abnormal   Collection Time    01/31/13 11:12 AM      Result Value Range   Glucose-Capillary 152 (*) 70 - 99 mg/dL  GLUCOSE, CAPILLARY     Status: Abnormal   Collection Time    01/31/13  4:20 PM      Result Value Range   Glucose-Capillary 139 (*) 70 - 99 mg/dL  GLUCOSE, CAPILLARY     Status: Abnormal   Collection Time    01/31/13  9:04 PM      Result Value Range   Glucose-Capillary 133 (*) 70 - 99 mg/dL   Comment 1 Notify RN    GLUCOSE, CAPILLARY     Status: Abnormal   Collection Time    01/31/13 11:55 PM      Result Value Range   Glucose-Capillary 135 (*) 70 - 99 mg/dL  GLUCOSE, CAPILLARY     Status: Abnormal   Collection Time    02/01/13  3:55 AM      Result Value Range   Glucose-Capillary 119 (*) 70 - 99 mg/dL   Comment 1 Notify RN    CBC     Status: Abnormal   Collection Time    02/01/13  4:50 AM      Result Value Range   WBC 8.9  4.0 - 10.5 K/uL   RBC 3.42 (*) 3.87 - 5.11 MIL/uL   Hemoglobin 10.1 (*) 12.0 - 15.0 g/dL   HCT 46.9 (*) 62.9 - 52.8 %   MCV 88.3  78.0 - 100.0 fL   MCH 29.5  26.0 - 34.0 pg   MCHC 33.4   30.0 - 36.0 g/dL   RDW 41.3  24.4 - 01.0 %   Platelets 201  150 - 400 K/uL  COMPREHENSIVE METABOLIC PANEL     Status: Abnormal   Collection Time    02/01/13  4:50 AM      Result Value Range   Sodium 132 (*) 135 - 145 mEq/L   Potassium 3.6  3.5 - 5.1 mEq/L   Chloride 97  96 - 112 mEq/L   CO2 29  19 - 32 mEq/L   Glucose, Bld 140 (*) 70 - 99 mg/dL   BUN 6  6 - 23 mg/dL   Creatinine, Ser 2.72  0.50 - 1.10 mg/dL   Calcium 9.0  8.4 - 53.6 mg/dL   Total Protein 5.8 (*) 6.0 - 8.3 g/dL   Albumin 2.3 (*) 3.5 - 5.2 g/dL   AST 18  0 - 37 U/L   ALT 17  0 - 35  U/L   Alkaline Phosphatase 88  39 - 117 U/L   Total Bilirubin 0.4  0.3 - 1.2 mg/dL   GFR calc non Af Amer >90  >90 mL/min   GFR calc Af Amer >90  >90 mL/min   Comment:            The eGFR has been calculated     using the CKD EPI equation.     This calculation has not been     validated in all clinical     situations.     eGFR's persistently     <90 mL/min signify     possible Chronic Kidney Disease.  GLUCOSE, CAPILLARY     Status: Abnormal   Collection Time    02/01/13  7:23 AM      Result Value Range   Glucose-Capillary 164 (*) 70 - 99 mg/dL   Comment 1 Notify RN    GLUCOSE, CAPILLARY     Status: Abnormal   Collection Time    02/01/13 11:58 AM      Result Value Range   Glucose-Capillary 108 (*) 70 - 99 mg/dL   Comment 1 Notify RN    GLUCOSE, CAPILLARY     Status: Abnormal   Collection Time    02/01/13  4:14 PM      Result Value Range   Glucose-Capillary 129 (*) 70 - 99 mg/dL   Comment 1 Notify RN    GLUCOSE, CAPILLARY     Status: Abnormal   Collection Time    02/01/13  7:45 PM      Result Value Range   Glucose-Capillary 103 (*) 70 - 99 mg/dL  GLUCOSE, CAPILLARY     Status: Abnormal   Collection Time    02/02/13 12:01 AM      Result Value Range   Glucose-Capillary 105 (*) 70 - 99 mg/dL  GLUCOSE, CAPILLARY     Status: Abnormal   Collection Time    02/02/13  3:52 AM      Result Value Range   Glucose-Capillary  106 (*) 70 - 99 mg/dL  CBC     Status: Abnormal   Collection Time    02/02/13  4:45 AM      Result Value Range   WBC 5.0  4.0 - 10.5 K/uL   RBC 3.34 (*) 3.87 - 5.11 MIL/uL   Hemoglobin 9.8 (*) 12.0 - 15.0 g/dL   HCT 16.1 (*) 09.6 - 04.5 %   MCV 88.6  78.0 - 100.0 fL   MCH 29.3  26.0 - 34.0 pg   MCHC 33.1  30.0 - 36.0 g/dL   RDW 40.9  81.1 - 91.4 %   Platelets 211  150 - 400 K/uL  COMPREHENSIVE METABOLIC PANEL     Status: Abnormal   Collection Time    02/02/13  4:45 AM      Result Value Range   Sodium 135  135 - 145 mEq/L   Potassium 4.1  3.5 - 5.1 mEq/L   Chloride 99  96 - 112 mEq/L   CO2 31  19 - 32 mEq/L   Glucose, Bld 118 (*) 70 - 99 mg/dL   BUN 9  6 - 23 mg/dL   Creatinine, Ser 7.82  0.50 - 1.10 mg/dL   Calcium 9.3  8.4 - 95.6 mg/dL   Total Protein 5.9 (*) 6.0 - 8.3 g/dL   Albumin 2.2 (*) 3.5 - 5.2 g/dL   AST 15  0 - 37 U/L   ALT  15  0 - 35 U/L   Alkaline Phosphatase 115  39 - 117 U/L   Total Bilirubin 0.4  0.3 - 1.2 mg/dL   GFR calc non Af Amer 86 (*) >90 mL/min   GFR calc Af Amer >90  >90 mL/min   Comment:            The eGFR has been calculated     using the CKD EPI equation.     This calculation has not been     validated in all clinical     situations.     eGFR's persistently     <90 mL/min signify     possible Chronic Kidney Disease.    Radiology/Results: No results found.  Anti-infectives: Anti-infectives   Start     Dose/Rate Route Frequency Ordered Stop   01/28/13 1500  cefOXitin (MEFOXIN) 1 g in dextrose 5 % 50 mL IVPB     1 g 100 mL/hr over 30 Minutes Intravenous Every 6 hours 01/28/13 1449 01/29/13 0300   01/28/13 0537  cefOXitin (MEFOXIN) 2 g in dextrose 5 % 50 mL IVPB     2 g 100 mL/hr over 30 Minutes Intravenous On call to O.R. 01/28/13 0537 01/28/13 0756      Assessment/Plan: Problem List: Patient Active Problem List   Diagnosis Date Noted  . 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 Hyperlipidemia   . Arthritis   . GERD (gastroesophageal reflux disease)   . Hypertension   . Pancreatic mass 09/15/2012    Stable with postop ileus.    D/c OnQ. PT consult Clear liquid diet.   Hydralazine, metoprolol for HTN. PCA.   LOS: 5 days    [facility]  [phone]  02/02/2013 7:38 AM

## 2013-02-03 LAB — CBC
Hemoglobin: 9.3 g/dL — ABNORMAL LOW (ref 12.0–15.0)
MCH: 28.8 pg (ref 26.0–34.0)
MCHC: 32.7 g/dL (ref 30.0–36.0)
Platelets: 209 10*3/uL (ref 150–400)
RDW: 12.3 % (ref 11.5–15.5)

## 2013-02-03 LAB — COMPREHENSIVE METABOLIC PANEL
ALT: 19 U/L (ref 0–35)
AST: 22 U/L (ref 0–37)
Albumin: 2.3 g/dL — ABNORMAL LOW (ref 3.5–5.2)
Alkaline Phosphatase: 196 U/L — ABNORMAL HIGH (ref 39–117)
Calcium: 9.2 mg/dL (ref 8.4–10.5)
GFR calc Af Amer: >90 mL/min (ref >90)
Potassium: 4.2 mEq/L (ref 3.5–5.1)
Sodium: 135 mEq/L (ref 135–145)
Total Protein: 5.9 g/dL — ABNORMAL LOW (ref 6.0–8.3)

## 2013-02-03 LAB — GLUCOSE, CAPILLARY
Glucose-Capillary: 102 mg/dL — ABNORMAL HIGH (ref 70–99)
Glucose-Capillary: 93 mg/dL (ref 70–99)
Glucose-Capillary: 122 mg/dL — ABNORMAL HIGH (ref 70–99)

## 2013-02-03 NOTE — Clinical Social Work Psychosocial (Unsigned)
     Clinical Social Work Department BRIEF PSYCHOSOCIAL ASSESSMENT 02/03/2013  Patient:  Sheila Hurst, Sheila Hurst     Account Number:  [id number]     Admit date:  01/28/2013  Clinical Social Worker:  Hattie Perch  Date/Time:  02/03/2013 12:00 M  Referred by:  Physician  Date Referred:  02/03/2013 Referred for  SNF Placement   Other Referral:   Interview type:  Patient Other interview type:    PSYCHOSOCIAL DATA Living Status:  ALONE Admitted from facility:   Level of care:   Primary support name:  Maximino Greenland Primary support relationship to patient:  CHILD, ADULT Degree of support available:   good    CURRENT CONCERNS Current Concerns  Post-Acute Placement   Other Concerns:    SOCIAL WORK ASSESSMENT / PLAN CSW met with patient. patient is alert and oriented X3. patient in need of snf placement. patient states that she lives alone. she has not been to rehab before. patient is agreeable to being faxed out and recieving bed offers.   Assessment/plan status:   Other assessment/ plan:   Information/referral to community resources:    PATIENTS/FAMILYS RESPONSE TO PLAN OF CARE: patient is calm and aknowledges need for snf.

## 2013-02-03 NOTE — Progress Notes (Signed)
Patient ID: Sheila Hurst, female   DOB: 04-Mar-1941, 72 y.o.   MRN: [id number]  Subjective: No N/V.  Pain controlled.  Tolerated clear liquids yesterday.  No flatus or BM.  Objective: Vital signs in last 24 hours: Temp:  [97.7 F (36.5 C)-99.4 F (37.4 C)] 98.4 F (36.9 C) (06/04 1300) Pulse Rate:  [60-72] 60 (06/04 1300) Resp:  [18-24] 20 (06/04 1300) BP: (130-158)/(75-89) 158/83 mmHg (06/04 1300) SpO2:  [100 %] 100 % (06/04 1300)  Intake/Output from previous day: 06/03 0701 - 06/04 0700 In: 1275 [I.V.:1275] Out: 85 [Drains:85] Intake/Output this shift: Total I/O In: -  Out: 250 [Urine:250]  Physical Exam:  Breathing comfortably. Abdomen flat.  No drainage from incision, no erythema On Q in place, empty.  JPs serosanguinous-no bile. Nontender.      Lab Results:  Results for orders placed during the hospital encounter of 01/28/13 (from the past 48 hour(s))  GLUCOSE, CAPILLARY     Status: Abnormal   Collection Time    02/01/13  4:14 PM      Result Value Range   Glucose-Capillary 129 (*) 70 - 99 mg/dL   Comment 1 Notify RN    GLUCOSE, CAPILLARY     Status: Abnormal   Collection Time    02/01/13  7:45 PM      Result Value Range   Glucose-Capillary 103 (*) 70 - 99 mg/dL  GLUCOSE, CAPILLARY     Status: Abnormal   Collection Time    02/02/13 12:01 AM      Result Value Range   Glucose-Capillary 105 (*) 70 - 99 mg/dL  GLUCOSE, CAPILLARY     Status: Abnormal   Collection Time    02/02/13  3:52 AM      Result Value Range   Glucose-Capillary 106 (*) 70 - 99 mg/dL  CBC     Status: Abnormal   Collection Time    02/02/13  4:45 AM      Result Value Range   WBC 5.0  4.0 - 10.5 K/uL   RBC 3.34 (*) 3.87 - 5.11 MIL/uL   Hemoglobin 9.8 (*) 12.0 - 15.0 g/dL   HCT 14.7 (*) 82.9 - 56.2 %   MCV 88.6  78.0 - 100.0 fL   MCH 29.3  26.0 - 34.0 pg   MCHC 33.1  30.0 - 36.0 g/dL   RDW 13.0  86.5 - 78.4 %   Platelets 211  150 - 400 K/uL  COMPREHENSIVE METABOLIC PANEL     Status:  Abnormal   Collection Time    02/02/13  4:45 AM      Result Value Range   Sodium 135  135 - 145 mEq/L   Potassium 4.1  3.5 - 5.1 mEq/L   Chloride 99  96 - 112 mEq/L   CO2 31  19 - 32 mEq/L   Glucose, Bld 118 (*) 70 - 99 mg/dL   BUN 9  6 - 23 mg/dL   Creatinine, Ser 6.96  0.50 - 1.10 mg/dL   Calcium 9.3  8.4 - 29.5 mg/dL   Total Protein 5.9 (*) 6.0 - 8.3 g/dL   Albumin 2.2 (*) 3.5 - 5.2 g/dL   AST 15  0 - 37 U/L   ALT 15  0 - 35 U/L   Alkaline Phosphatase 115  39 - 117 U/L   Total Bilirubin 0.4  0.3 - 1.2 mg/dL   GFR calc non Af Amer 86 (*) >90 mL/min   GFR calc Af Amer >90  >  90 mL/min   Comment:            The eGFR has been calculated     using the CKD EPI equation.     This calculation has not been     validated in all clinical     situations.     eGFR's persistently     <90 mL/min signify     possible Chronic Kidney Disease.  GLUCOSE, CAPILLARY     Status: Abnormal   Collection Time    02/02/13  8:22 AM      Result Value Range   Glucose-Capillary 109 (*) 70 - 99 mg/dL   Comment 1 Notify RN    GLUCOSE, CAPILLARY     Status: Abnormal   Collection Time    02/02/13 11:14 AM      Result Value Range   Glucose-Capillary 102 (*) 70 - 99 mg/dL   Comment 1 Notify RN    GLUCOSE, CAPILLARY     Status: Abnormal   Collection Time    02/02/13  4:27 PM      Result Value Range   Glucose-Capillary 104 (*) 70 - 99 mg/dL   Comment 1 Notify RN    GLUCOSE, CAPILLARY     Status: Abnormal   Collection Time    02/02/13  8:09 PM      Result Value Range   Glucose-Capillary 135 (*) 70 - 99 mg/dL   Comment 1 Notify RN    GLUCOSE, CAPILLARY     Status: None   Collection Time    02/02/13 11:58 PM      Result Value Range   Glucose-Capillary 93  70 - 99 mg/dL   Comment 1 Notify RN    GLUCOSE, CAPILLARY     Status: Abnormal   Collection Time    02/03/13  4:05 AM      Result Value Range   Glucose-Capillary 102 (*) 70 - 99 mg/dL   Comment 1 Notify RN    CBC     Status: Abnormal    Collection Time    02/03/13  5:04 AM      Result Value Range   WBC 4.2  4.0 - 10.5 K/uL   RBC 3.23 (*) 3.87 - 5.11 MIL/uL   Hemoglobin 9.3 (*) 12.0 - 15.0 g/dL   HCT 98.1 (*) 19.1 - 47.8 %   MCV 87.9  78.0 - 100.0 fL   MCH 28.8  26.0 - 34.0 pg   MCHC 32.7  30.0 - 36.0 g/dL   RDW 29.5  62.1 - 30.8 %   Platelets 209  150 - 400 K/uL  COMPREHENSIVE METABOLIC PANEL     Status: Abnormal   Collection Time    02/03/13  5:04 AM      Result Value Range   Sodium 135  135 - 145 mEq/L   Potassium 4.2  3.5 - 5.1 mEq/L   Chloride 99  96 - 112 mEq/L   CO2 33 (*) 19 - 32 mEq/L   Glucose, Bld 113 (*) 70 - 99 mg/dL   BUN 9  6 - 23 mg/dL   Creatinine, Ser 6.57  0.50 - 1.10 mg/dL   Calcium 9.2  8.4 - 84.6 mg/dL   Total Protein 5.9 (*) 6.0 - 8.3 g/dL   Albumin 2.3 (*) 3.5 - 5.2 g/dL   AST 22  0 - 37 U/L   ALT 19  0 - 35 U/L   Alkaline Phosphatase 196 (*) 39 - 117 U/L  Total Bilirubin 0.3  0.3 - 1.2 mg/dL   GFR calc non Af Amer 87 (*) >90 mL/min   GFR calc Af Amer >90  >90 mL/min   Comment:            The eGFR has been calculated     using the CKD EPI equation.     This calculation has not been     validated in all clinical     situations.     eGFR's persistently     <90 mL/min signify     possible Chronic Kidney Disease.    Radiology/Results: No results found.  Anti-infectives: Anti-infectives   Start     Dose/Rate Route Frequency Ordered Stop   01/28/13 1500  cefOXitin (MEFOXIN) 1 g in dextrose 5 % 50 mL IVPB     1 g 100 mL/hr over 30 Minutes Intravenous Every 6 hours 01/28/13 1449 01/29/13 0300   01/28/13 0537  cefOXitin (MEFOXIN) 2 g in dextrose 5 % 50 mL IVPB     2 g 100 mL/hr over 30 Minutes Intravenous On call to O.R. 01/28/13 0537 01/28/13 0756      Assessment/Plan: Problem List: Patient Active Problem List   Diagnosis Date Noted  . 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 Hyperlipidemia   .  Arthritis   . GERD (gastroesophageal reflux disease)   . Hypertension   . Pancreatic mass 09/15/2012    Stable with postop ileus.   PT consult Full liquid diet.   Hydralazine, metoprolol for HTN. PCA.     LOS: 6 days    [facility]  [phone]  02/03/2013 1:48 PM

## 2013-02-04 LAB — GLUCOSE, CAPILLARY
Glucose-Capillary: 103 mg/dL — ABNORMAL HIGH (ref 70–99)
Glucose-Capillary: 116 mg/dL — ABNORMAL HIGH (ref 70–99)
Glucose-Capillary: 110 mg/dL — ABNORMAL HIGH (ref 70–99)

## 2013-02-04 LAB — CBC
Hemoglobin: 9.2 g/dL — ABNORMAL LOW (ref 12.0–15.0)
MCH: 28.8 pg (ref 26.0–34.0)
MCV: 86.9 fL (ref 78.0–100.0)
Platelets: 213 10*3/uL (ref 150–400)
RBC: 3.2 MIL/uL — ABNORMAL LOW (ref 3.87–5.11)
WBC: 4.2 10*3/uL (ref 4.0–10.5)

## 2013-02-04 LAB — COMPREHENSIVE METABOLIC PANEL
ALT: 24 U/L (ref 0–35)
AST: 28 U/L (ref 0–37)
CO2: 31 mEq/L (ref 19–32)
Calcium: 9.2 mg/dL (ref 8.4–10.5)
Chloride: 96 mEq/L (ref 96–112)
GFR calc Af Amer: >90 mL/min (ref >90)
GFR calc non Af Amer: >90 mL/min (ref >90)
Glucose, Bld: 111 mg/dL — ABNORMAL HIGH (ref 70–99)
Sodium: 133 mEq/L — ABNORMAL LOW (ref 135–145)
Total Bilirubin: 0.3 mg/dL (ref 0.3–1.2)

## 2013-02-04 MED ORDER — OXYCODONE-ACETAMINOPHEN 5-325 MG PO TABS
1.0000 | ORAL_TABLET | ORAL | Status: DC | PRN
  Administered 2013-02-06 – 2013-02-08 (×3): 1 via ORAL
  Filled 2013-02-04 (×3): qty 1

## 2013-02-04 MED ORDER — BISACODYL 10 MG RE SUPP
10.0000 mg | Freq: Every day | RECTAL | Status: DC
  Administered 2013-02-04 – 2013-02-06 (×3): 10 mg via RECTAL
  Filled 2013-02-04 (×3): qty 1

## 2013-02-04 NOTE — Progress Notes (Signed)
Physical Therapy Treatment Patient Details Name: Sheila Hurst MRN: [id number] DOB: [birth year] Today's Date: 02/04/2013 Time: 4696-2952 PT Time Calculation (min): 26 min  PT Assessment / Plan / Recommendation Comments on Treatment Session  POD # 6 WIPPLE planning to D/C to home with family support.  Assisted pt OOB to [facility] to void then amb in hallway.  Pt required increased time and + 2 asssit for safety and equipment.  Pt on 3 lts nasal sats decreased to 83% during gait.    Follow Up Recommendations  SNF;Supervision/Assistance - 24 hour     Does the patient have the potential to tolerate intense rehabilitation     Barriers to Discharge        Equipment Recommendations  Rolling walker with 5" wheels    Recommendations for Other Services    Frequency Min 3X/week   Plan      Precautions / Restrictions Precautions Precautions: Fall Precaution Comments: Abd incision Restrictions Weight Bearing Restrictions: No   Pertinent Vitals/Pain C/o 7/10 ABD pain with activity  SATURATION QUALIFICATIONS: (This note is used to comply with regulatory documentation for home oxygen)  Patient Saturations on Room Air at Rest = 91%  Patient Saturations on Room Air while Ambulating = 81%  Patient Saturations on 3 Liters of oxygen while Ambulating = 90%  Please briefly explain why patient needs home oxygen: RA sats dropped to low 80's with activity    Mobility  Bed Mobility Bed Mobility: Supine to Sit Supine to Sit: 2: Max assist Details for Bed Mobility Assistance: increased assist to perform supine to sit 2nd ABD pain.  Once upright pt Supervision Transfers Transfers: Sit to Stand;Stand to Sit Sit to Stand: 4: Min guard;From bed;From toilet Stand to Sit: 4: Min guard;To toilet;To chair/3-in-1 Details for Transfer Assistance: 25% VC's on proper hand placement to push up vs pull up from walker and complete turn prior to sit while reaching back for recliner to control  decend. Ambulation/Gait Ambulation/Gait Assistance: 1: +2 Total assist Ambulation/Gait: Patient Percentage: 90% Ambulation Distance (Feet): 70 Feet Assistive device: Rolling walker Ambulation/Gait Assistance Details: + 2 for safety and equipment (IV/O2/chair) on 3 lts O2 sats dropped to 83 %.  VC's on deep breathing but then pt c/o ABD pain.  HR avg 113.  Tolerated well.  50% VC's to increase stride length and decrease shuffled gait.  Gait Pattern: Step-to pattern;Decreased stride length;Narrow base of support;Shuffle;Trunk flexed Gait velocity: decreased    PT Goals                                              progressing    Visit Information  Last PT Received On: 02/04/13 Assistance Needed: +2 (amb equipment/chair)    Subjective Data      Cognition       Balance     End of Session PT - End of Session Equipment Utilized During Treatment: Gait belt;Oxygen (3 lts) Activity Tolerance: Patient limited by fatigue;Patient limited by pain Patient left: in chair;with call bell/phone within reach;with family/visitor present   Felecia Shelling  PTA [facility]  Acute  Rehab Pager      [phone]

## 2013-02-04 NOTE — Progress Notes (Signed)
Patient ID: Sheila Hurst, female   DOB: [birth year], 72 y.o.   MRN: [id number]  Subjective: No N/V.  Pain controlled.  Tolerated full liquids yesterday, but still no flatus or BM.  Objective: Vital signs in last 24 hours: Temp:  [98.1 F (36.7 C)-98.4 F (36.9 C)] 98.1 F (36.7 C) (06/05 0618) Pulse Rate:  [60-70] 67 (06/05 0618) Resp:  [17-26] 23 (06/05 0820) BP: (151-158)/(77-83) 151/77 mmHg (06/05 0618) SpO2:  [98 %-100 %] 98 % (06/05 0820)  Intake/Output from previous day: 06/04 0701 - 06/05 0700 In: 600 [I.V.:600] Out: 400 [Urine:250; Drains:150] Intake/Output this shift:    Physical Exam:  Breathing comfortably. Abdomen flat.  No drainage from incision, no erythema  JPs serosanguinous-no bile. Nontender.      Lab Results:  Results for orders placed during the hospital encounter of 01/28/13 (from the past 48 hour(s))  GLUCOSE, CAPILLARY     Status: Abnormal   Collection Time    02/02/13 11:14 AM      Result Value Range   Glucose-Capillary 102 (*) 70 - 99 mg/dL   Comment 1 Notify RN    GLUCOSE, CAPILLARY     Status: Abnormal   Collection Time    02/02/13  4:27 PM      Result Value Range   Glucose-Capillary 104 (*) 70 - 99 mg/dL   Comment 1 Notify RN    GLUCOSE, CAPILLARY     Status: Abnormal   Collection Time    02/02/13  8:09 PM      Result Value Range   Glucose-Capillary 135 (*) 70 - 99 mg/dL   Comment 1 Notify RN    GLUCOSE, CAPILLARY     Status: None   Collection Time    02/02/13 11:58 PM      Result Value Range   Glucose-Capillary 93  70 - 99 mg/dL   Comment 1 Notify RN    GLUCOSE, CAPILLARY     Status: Abnormal   Collection Time    02/03/13  4:05 AM      Result Value Range   Glucose-Capillary 102 (*) 70 - 99 mg/dL   Comment 1 Notify RN    CBC     Status: Abnormal   Collection Time    02/03/13  5:04 AM      Result Value Range   WBC 4.2  4.0 - 10.5 K/uL   RBC 3.23 (*) 3.87 - 5.11 MIL/uL   Hemoglobin 9.3 (*) 12.0 - 15.0 g/dL   HCT 86.5 (*)  78.4 - 46.0 %   MCV 87.9  78.0 - 100.0 fL   MCH 28.8  26.0 - 34.0 pg   MCHC 32.7  30.0 - 36.0 g/dL   RDW 69.6  29.5 - 28.4 %   Platelets 209  150 - 400 K/uL  COMPREHENSIVE METABOLIC PANEL     Status: Abnormal   Collection Time    02/03/13  5:04 AM      Result Value Range   Sodium 135  135 - 145 mEq/L   Potassium 4.2  3.5 - 5.1 mEq/L   Chloride 99  96 - 112 mEq/L   CO2 33 (*) 19 - 32 mEq/L   Glucose, Bld 113 (*) 70 - 99 mg/dL   BUN 9  6 - 23 mg/dL   Creatinine, Ser 1.32  0.50 - 1.10 mg/dL   Calcium 9.2  8.4 - 44.0 mg/dL   Total Protein 5.9 (*) 6.0 - 8.3 g/dL   Albumin 2.3 (*)  3.5 - 5.2 g/dL   AST 22  0 - 37 U/L   ALT 19  0 - 35 U/L   Alkaline Phosphatase 196 (*) 39 - 117 U/L   Total Bilirubin 0.3  0.3 - 1.2 mg/dL   GFR calc non Af Amer 87 (*) >90 mL/min   GFR calc Af Amer >90  >90 mL/min   Comment:            The eGFR has been calculated     using the CKD EPI equation.     This calculation has not been     validated in all clinical     situations.     eGFR's persistently     <90 mL/min signify     possible Chronic Kidney Disease.  GLUCOSE, CAPILLARY     Status: Abnormal   Collection Time    02/03/13  7:26 AM      Result Value Range   Glucose-Capillary 106 (*) 70 - 99 mg/dL   Comment 1 Notify RN    GLUCOSE, CAPILLARY     Status: Abnormal   Collection Time    02/03/13 11:22 AM      Result Value Range   Glucose-Capillary 122 (*) 70 - 99 mg/dL   Comment 1 Notify RN    GLUCOSE, CAPILLARY     Status: Abnormal   Collection Time    02/03/13  4:40 PM      Result Value Range   Glucose-Capillary 116 (*) 70 - 99 mg/dL  GLUCOSE, CAPILLARY     Status: Abnormal   Collection Time    02/03/13  9:02 PM      Result Value Range   Glucose-Capillary 143 (*) 70 - 99 mg/dL   Comment 1 Notify RN    GLUCOSE, CAPILLARY     Status: Abnormal   Collection Time    02/04/13  1:05 AM      Result Value Range   Glucose-Capillary 103 (*) 70 - 99 mg/dL   Comment 1 Notify RN    GLUCOSE,  CAPILLARY     Status: Abnormal   Collection Time    02/04/13  4:00 AM      Result Value Range   Glucose-Capillary 116 (*) 70 - 99 mg/dL   Comment 1 Notify RN    CBC     Status: Abnormal   Collection Time    02/04/13  4:15 AM      Result Value Range   WBC 4.2  4.0 - 10.5 K/uL   RBC 3.20 (*) 3.87 - 5.11 MIL/uL   Hemoglobin 9.2 (*) 12.0 - 15.0 g/dL   HCT 06.3 (*) 01.6 - 01.0 %   MCV 86.9  78.0 - 100.0 fL   MCH 28.8  26.0 - 34.0 pg   MCHC 33.1  30.0 - 36.0 g/dL   RDW 93.2  35.5 - 73.2 %   Platelets 213  150 - 400 K/uL  COMPREHENSIVE METABOLIC PANEL     Status: Abnormal   Collection Time    02/04/13  4:15 AM      Result Value Range   Sodium 133 (*) 135 - 145 mEq/L   Potassium 3.9  3.5 - 5.1 mEq/L   Chloride 96  96 - 112 mEq/L   CO2 31  19 - 32 mEq/L   Glucose, Bld 111 (*) 70 - 99 mg/dL   BUN 7  6 - 23 mg/dL   Creatinine, Ser 2.02  0.50 - 1.10 mg/dL  Calcium 9.2  8.4 - 10.5 mg/dL   Total Protein 5.8 (*) 6.0 - 8.3 g/dL   Albumin 2.2 (*) 3.5 - 5.2 g/dL   AST 28  0 - 37 U/L   ALT 24  0 - 35 U/L   Alkaline Phosphatase 245 (*) 39 - 117 U/L   Total Bilirubin 0.3  0.3 - 1.2 mg/dL   GFR calc non Af Amer >90  >90 mL/min   GFR calc Af Amer >90  >90 mL/min   Comment:            The eGFR has been calculated     using the CKD EPI equation.     This calculation has not been     validated in all clinical     situations.     eGFR's persistently     <90 mL/min signify     possible Chronic Kidney Disease.  GLUCOSE, CAPILLARY     Status: Abnormal   Collection Time    02/04/13  7:48 AM      Result Value Range   Glucose-Capillary 110 (*) 70 - 99 mg/dL   Comment 1 Notify RN      Radiology/Results: No results found.  Anti-infectives: Anti-infectives   Start     Dose/Rate Route Frequency Ordered Stop   01/28/13 1500  cefOXitin (MEFOXIN) 1 g in dextrose 5 % 50 mL IVPB     1 g 100 mL/hr over 30 Minutes Intravenous Every 6 hours 01/28/13 1449 01/29/13 0300   01/28/13 0537  cefOXitin  (MEFOXIN) 2 g in dextrose 5 % 50 mL IVPB     2 g 100 mL/hr over 30 Minutes Intravenous On call to O.R. 01/28/13 0537 01/28/13 0756      Assessment/Plan: Problem List: Patient Active Problem List   Diagnosis Date Noted  . 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 Hyperlipidemia   . Arthritis   . GERD (gastroesophageal reflux disease)   . Hypertension   . Pancreatic mass 09/15/2012    Advance to low fat diet once flatus or BM PT consult Full liquid diet.   Hydralazine, metoprolol for HTN. D/c PCA.   Dulcolax suppository   LOS: 7 days    [facility]  [phone]  02/04/2013 10:49 AM

## 2013-02-05 LAB — CBC
HCT: 28.9 % — ABNORMAL LOW (ref 36.0–46.0)
Hemoglobin: 9.7 g/dL — ABNORMAL LOW (ref 12.0–15.0)
MCH: 28.9 pg (ref 26.0–34.0)
MCV: 86 fL (ref 78.0–100.0)
RBC: 3.36 MIL/uL — ABNORMAL LOW (ref 3.87–5.11)

## 2013-02-05 LAB — COMPREHENSIVE METABOLIC PANEL
ALT: 23 U/L (ref 0–35)
BUN: 6 mg/dL (ref 6–23)
CO2: 30 mEq/L (ref 19–32)
Calcium: 9.1 mg/dL (ref 8.4–10.5)
Creatinine, Ser: 0.6 mg/dL (ref 0.50–1.10)
GFR calc Af Amer: >90 mL/min (ref >90)
GFR calc non Af Amer: >90 mL/min (ref >90)
Glucose, Bld: 108 mg/dL — ABNORMAL HIGH (ref 70–99)
Sodium: 132 mEq/L — ABNORMAL LOW (ref 135–145)

## 2013-02-05 LAB — GLUCOSE, CAPILLARY
Glucose-Capillary: 104 mg/dL — ABNORMAL HIGH (ref 70–99)
Glucose-Capillary: 78 mg/dL (ref 70–99)

## 2013-02-05 NOTE — Progress Notes (Signed)
Nutrition Education Note  Discussed diet therapy s/p Whipple procedure. Explained relationship between pancreas and fat. Encouraged small frequent low fat meals. Handouts provided. Teach back method used. Expect good compliance. RD contact information provided.   Levon Hedger MS, RD, LDN [phone] Pager [phone] After Hours Pager

## 2013-02-05 NOTE — Progress Notes (Signed)
Patient ID: Sheila Hurst, female   DOB: 1941-04-15, 72 y.o.   MRN: [id number]  Subjective: Tolerated full liquids without n/v.  Had 3 BMs yesterday.    Objective: Vital signs in last 24 hours: Temp:  [98.1 F (36.7 C)-98.4 F (36.9 C)] 98.1 F (36.7 C) (06/06 0534) Pulse Rate:  [70-72] 72 (06/06 0534) Resp:  [16-18] 16 (06/06 0534) BP: (134-172)/(83) 172/83 mmHg (06/06 0534) SpO2:  [92 %-97 %] 92 % (06/06 0534)  Intake/Output from previous day: 06/05 0701 - 06/06 0700 In: 840.3 [I.V.:840.3] Out: 635 [Urine:550; Drains:85] Intake/Output this shift:    Physical Exam:  Breathing comfortably. Abdomen flat.  No drainage from incision, no erythema  JPs serosanguinous-no bile. Nontender.      Lab Results:  Results for orders placed during the hospital encounter of 01/28/13 (from the past 48 hour(s))  GLUCOSE, CAPILLARY     Status: Abnormal   Collection Time    02/03/13  4:40 PM      Result Value Range   Glucose-Capillary 116 (*) 70 - 99 mg/dL  GLUCOSE, CAPILLARY     Status: Abnormal   Collection Time    02/03/13  9:02 PM      Result Value Range   Glucose-Capillary 143 (*) 70 - 99 mg/dL   Comment 1 Notify RN    GLUCOSE, CAPILLARY     Status: Abnormal   Collection Time    02/04/13  1:05 AM      Result Value Range   Glucose-Capillary 103 (*) 70 - 99 mg/dL   Comment 1 Notify RN    GLUCOSE, CAPILLARY     Status: Abnormal   Collection Time    02/04/13  4:00 AM      Result Value Range   Glucose-Capillary 116 (*) 70 - 99 mg/dL   Comment 1 Notify RN    CBC     Status: Abnormal   Collection Time    02/04/13  4:15 AM      Result Value Range   WBC 4.2  4.0 - 10.5 K/uL   RBC 3.20 (*) 3.87 - 5.11 MIL/uL   Hemoglobin 9.2 (*) 12.0 - 15.0 g/dL   HCT 40.9 (*) 81.1 - 91.4 %   MCV 86.9  78.0 - 100.0 fL   MCH 28.8  26.0 - 34.0 pg   MCHC 33.1  30.0 - 36.0 g/dL   RDW 78.2  95.6 - 21.3 %   Platelets 213  150 - 400 K/uL  COMPREHENSIVE METABOLIC PANEL     Status: Abnormal   Collection Time    02/04/13  4:15 AM      Result Value Range   Sodium 133 (*) 135 - 145 mEq/L   Potassium 3.9  3.5 - 5.1 mEq/L   Chloride 96  96 - 112 mEq/L   CO2 31  19 - 32 mEq/L   Glucose, Bld 111 (*) 70 - 99 mg/dL   BUN 7  6 - 23 mg/dL   Creatinine, Ser 0.86  0.50 - 1.10 mg/dL   Calcium 9.2  8.4 - 57.8 mg/dL   Total Protein 5.8 (*) 6.0 - 8.3 g/dL   Albumin 2.2 (*) 3.5 - 5.2 g/dL   AST 28  0 - 37 U/L   ALT 24  0 - 35 U/L   Alkaline Phosphatase 245 (*) 39 - 117 U/L   Total Bilirubin 0.3  0.3 - 1.2 mg/dL   GFR calc non Af Amer >90  >90 mL/min   GFR  calc Af Amer >90  >90 mL/min   Comment:            The eGFR has been calculated     using the CKD EPI equation.     This calculation has not been     validated in all clinical     situations.     eGFR's persistently     <90 mL/min signify     possible Chronic Kidney Disease.  GLUCOSE, CAPILLARY     Status: Abnormal   Collection Time    02/04/13  7:48 AM      Result Value Range   Glucose-Capillary 110 (*) 70 - 99 mg/dL   Comment 1 Notify RN    GLUCOSE, CAPILLARY     Status: Abnormal   Collection Time    02/04/13 11:14 AM      Result Value Range   Glucose-Capillary 136 (*) 70 - 99 mg/dL   Comment 1 Notify RN    GLUCOSE, CAPILLARY     Status: Abnormal   Collection Time    02/04/13  4:34 PM      Result Value Range   Glucose-Capillary 111 (*) 70 - 99 mg/dL   Comment 1 Notify RN    GLUCOSE, CAPILLARY     Status: Abnormal   Collection Time    02/04/13  8:10 PM      Result Value Range   Glucose-Capillary 123 (*) 70 - 99 mg/dL  GLUCOSE, CAPILLARY     Status: None   Collection Time    02/05/13 12:06 AM      Result Value Range   Glucose-Capillary 97  70 - 99 mg/dL  GLUCOSE, CAPILLARY     Status: Abnormal   Collection Time    02/05/13  4:06 AM      Result Value Range   Glucose-Capillary 104 (*) 70 - 99 mg/dL  CBC     Status: Abnormal   Collection Time    02/05/13  4:55 AM      Result Value Range   WBC 4.2  4.0 - 10.5  K/uL   RBC 3.36 (*) 3.87 - 5.11 MIL/uL   Hemoglobin 9.7 (*) 12.0 - 15.0 g/dL   HCT 16.1 (*) 09.6 - 04.5 %   MCV 86.0  78.0 - 100.0 fL   MCH 28.9  26.0 - 34.0 pg   MCHC 33.6  30.0 - 36.0 g/dL   RDW 40.9  81.1 - 91.4 %   Platelets 254  150 - 400 K/uL  COMPREHENSIVE METABOLIC PANEL     Status: Abnormal   Collection Time    02/05/13  4:55 AM      Result Value Range   Sodium 132 (*) 135 - 145 mEq/L   Potassium 3.6  3.5 - 5.1 mEq/L   Chloride 95 (*) 96 - 112 mEq/L   CO2 30  19 - 32 mEq/L   Glucose, Bld 108 (*) 70 - 99 mg/dL   BUN 6  6 - 23 mg/dL   Creatinine, Ser 7.82  0.50 - 1.10 mg/dL   Calcium 9.1  8.4 - 95.6 mg/dL   Total Protein 5.8 (*) 6.0 - 8.3 g/dL   Albumin 2.2 (*) 3.5 - 5.2 g/dL   AST 24  0 - 37 U/L   ALT 23  0 - 35 U/L   Alkaline Phosphatase 252 (*) 39 - 117 U/L   Total Bilirubin 0.4  0.3 - 1.2 mg/dL   GFR calc non Af Amer >90  >  90 mL/min   GFR calc Af Amer >90  >90 mL/min   Comment:            The eGFR has been calculated     using the CKD EPI equation.     This calculation has not been     validated in all clinical     situations.     eGFR's persistently     <90 mL/min signify     possible Chronic Kidney Disease.  GLUCOSE, CAPILLARY     Status: Abnormal   Collection Time    02/05/13  7:15 AM      Result Value Range   Glucose-Capillary 100 (*) 70 - 99 mg/dL   Comment 1 Notify RN      Radiology/Results: No results found.  Anti-infectives: Anti-infectives   Start     Dose/Rate Route Frequency Ordered Stop   01/28/13 1500  cefOXitin (MEFOXIN) 1 g in dextrose 5 % 50 mL IVPB     1 g 100 mL/hr over 30 Minutes Intravenous Every 6 hours 01/28/13 1449 01/29/13 0300   01/28/13 0537  cefOXitin (MEFOXIN) 2 g in dextrose 5 % 50 mL IVPB     2 g 100 mL/hr over 30 Minutes Intravenous On call to O.R. 01/28/13 0537 01/28/13 0756      Assessment/Plan: Problem List: Patient Active Problem List   Diagnosis Date Noted  . 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 Hyperlipidemia   . Arthritis   . GERD (gastroesophageal reflux disease)   . Hypertension   . Pancreatic mass 09/15/2012    Advance to low fat diet  PT consult Hydralazine, metoprolol for HTN. D/c other JP and staples. Home when tolerating adequate PO. Nutrition consult.   LOS: 8 days    [facility]  [phone]  02/05/2013 11:44 AM

## 2013-02-06 LAB — GLUCOSE, CAPILLARY
Glucose-Capillary: 106 mg/dL — ABNORMAL HIGH (ref 70–99)
Glucose-Capillary: 99 mg/dL (ref 70–99)
Glucose-Capillary: 99 mg/dL (ref 70–99)

## 2013-02-06 LAB — CBC
HCT: 29.3 % — ABNORMAL LOW (ref 36.0–46.0)
Hemoglobin: 9.6 g/dL — ABNORMAL LOW (ref 12.0–15.0)
MCHC: 32.8 g/dL (ref 30.0–36.0)
WBC: 4.3 10*3/uL (ref 4.0–10.5)

## 2013-02-06 LAB — COMPREHENSIVE METABOLIC PANEL
Alkaline Phosphatase: 227 U/L — ABNORMAL HIGH (ref 39–117)
BUN: 7 mg/dL (ref 6–23)
Chloride: 97 mEq/L (ref 96–112)
GFR calc Af Amer: >90 mL/min (ref >90)
Glucose, Bld: 105 mg/dL — ABNORMAL HIGH (ref 70–99)
Potassium: 3.5 mEq/L (ref 3.5–5.1)
Total Bilirubin: 0.3 mg/dL (ref 0.3–1.2)

## 2013-02-06 NOTE — Progress Notes (Signed)
9 Days Post-Op  Subjective: No complaints. Hasn't taken much PO yet. She is having BM's  Objective: Vital signs in last 24 hours: Temp:  [97.8 F (36.6 C)-98.5 F (36.9 C)] 98.4 F (36.9 C) (06/07 0530) Pulse Rate:  [68-80] 68 (06/07 0530) Resp:  [18] 18 (06/07 0530) BP: (151-164)/(73-88) 151/76 mmHg (06/07 0530) SpO2:  [91 %-100 %] 91 % (06/07 0530) Last BM Date: 02/05/13  Intake/Output from previous day: 06/06 0701 - 06/07 0700 In: -  Out: 11 [Drains:10; Stool:1] Intake/Output this shift:    GI: soft, nontender. incision ok. minimal output from JP  Lab Results:   Recent Labs  02/05/13 0455 02/06/13 0514  WBC 4.2 4.3  HGB 9.7* 9.6*  HCT 28.9* 29.3*  PLT 254 262   BMET  Recent Labs  02/05/13 0455 02/06/13 0514  NA 132* 134*  K 3.6 3.5  CL 95* 97  CO2 30 30  GLUCOSE 108* 105*  BUN 6 7  CREATININE 0.60 0.59  CALCIUM 9.1 9.1   PT/INR No results found for this basename: LABPROT, INR,  in the last 72 hours ABG No results found for this basename: PHART, PCO2, PO2, HCO3,  in the last 72 hours  Studies/Results: No results found.  Anti-infectives: Anti-infectives   Start     Dose/Rate Route Frequency Ordered Stop   01/28/13 1500  cefOXitin (MEFOXIN) 1 g in dextrose 5 % 50 mL IVPB     1 g 100 mL/hr over 30 Minutes Intravenous Every 6 hours 01/28/13 1449 01/29/13 0300   01/28/13 0537  cefOXitin (MEFOXIN) 2 g in dextrose 5 % 50 mL IVPB     2 g 100 mL/hr over 30 Minutes Intravenous On call to O.R. 01/28/13 5621 01/28/13 0756      Assessment/Plan: s/p Procedure(s): LAPAROSCOPY DIAGNOSTIC,  WHIPPLE (N/A) WHIPPLE PROCEDURE (N/A) Advance diet D/C jp Encourage PO's  Hopefully home soon  LOS: 9 days    TOTH III,PAUL S 02/06/2013

## 2013-02-07 LAB — GLUCOSE, CAPILLARY
Glucose-Capillary: 105 mg/dL — ABNORMAL HIGH (ref 70–99)
Glucose-Capillary: 107 mg/dL — ABNORMAL HIGH (ref 70–99)
Glucose-Capillary: 111 mg/dL — ABNORMAL HIGH (ref 70–99)
Glucose-Capillary: 131 mg/dL — ABNORMAL HIGH (ref 70–99)
Glucose-Capillary: 82 mg/dL (ref 70–99)

## 2013-02-07 LAB — COMPREHENSIVE METABOLIC PANEL
ALT: 15 U/L (ref 0–35)
AST: 15 U/L (ref 0–37)
Albumin: 2.5 g/dL — ABNORMAL LOW (ref 3.5–5.2)
Calcium: 9 mg/dL (ref 8.4–10.5)
Chloride: 100 mEq/L (ref 96–112)
Creatinine, Ser: 0.54 mg/dL (ref 0.50–1.10)
Sodium: 137 mEq/L (ref 135–145)
Total Bilirubin: 0.3 mg/dL (ref 0.3–1.2)

## 2013-02-07 LAB — CBC
MCV: 86 fL (ref 78.0–100.0)
Platelets: 281 10*3/uL (ref 150–400)
RDW: 12.1 % (ref 11.5–15.5)
WBC: 5.3 10*3/uL (ref 4.0–10.5)

## 2013-02-07 MED ORDER — METOCLOPRAMIDE HCL 5 MG/ML IJ SOLN
5.0000 mg | Freq: Three times a day (TID) | INTRAMUSCULAR | Status: DC
  Administered 2013-02-07 – 2013-02-09 (×5): 5 mg via INTRAVENOUS
  Filled 2013-02-07 (×2): qty 1
  Filled 2013-02-07: qty 2
  Filled 2013-02-07: qty 1
  Filled 2013-02-07 (×2): qty 2
  Filled 2013-02-07 (×3): qty 1

## 2013-02-07 NOTE — Plan of Care (Signed)
Problem: Phase II Progression Outcomes Goal: Sutures/staples intact Outcome: Progressing Steri strips

## 2013-02-07 NOTE — Plan of Care (Signed)
Problem: Phase II Progression Outcomes Goal: Return of bowel function (flatus, BM) IF ABDOMINAL SURGERY:  Outcome: Progressing Pt experiencing diarrhea

## 2013-02-07 NOTE — Progress Notes (Signed)
10 Days Post-Op  Subjective: She is nauseated today and can not eat  Objective: Vital signs in last 24 hours: Temp:  [98.6 F (37 C)-99.7 F (37.6 C)] 99.7 F (37.6 C) (06/08 0559) Pulse Rate:  [59-77] 70 (06/08 0559) Resp:  [16-18] 16 (06/08 0559) BP: (129-174)/(71-82) 138/77 mmHg (06/08 0559) SpO2:  [95 %-97 %] 95 % (06/08 0559) Last BM Date: 02/06/13  Intake/Output from previous day: 06/07 0701 - 06/08 0700 In: -  Out: 5 [Drains:5] Intake/Output this shift:    GI: soft, nontender, nondistended  Lab Results:   Recent Labs  02/06/13 0514 02/07/13 0550  WBC 4.3 5.3  HGB 9.6* 9.9*  HCT 29.3* 28.8*  PLT 262 281   BMET  Recent Labs  02/06/13 0514 02/07/13 0550  NA 134* 137  K 3.5 3.1*  CL 97 100  CO2 30 30  GLUCOSE 105* 112*  BUN 7 8  CREATININE 0.59 0.54  CALCIUM 9.1 9.0   PT/INR No results found for this basename: LABPROT, INR,  in the last 72 hours ABG No results found for this basename: PHART, PCO2, PO2, HCO3,  in the last 72 hours  Studies/Results: No results found.  Anti-infectives: Anti-infectives   Start     Dose/Rate Route Frequency Ordered Stop   01/28/13 1500  cefOXitin (MEFOXIN) 1 g in dextrose 5 % 50 mL IVPB     1 g 100 mL/hr over 30 Minutes Intravenous Every 6 hours 01/28/13 1449 01/29/13 0300   01/28/13 0537  cefOXitin (MEFOXIN) 2 g in dextrose 5 % 50 mL IVPB     2 g 100 mL/hr over 30 Minutes Intravenous On call to O.R. 01/28/13 1610 01/28/13 0756      Assessment/Plan: s/p Procedure(s): LAPAROSCOPY DIAGNOSTIC,  WHIPPLE (N/A) WHIPPLE PROCEDURE (N/A) will add reglan today to help with nausea She can not be discharged until she can tolerate diet  LOS: 10 days    TOTH III,PAUL S 02/07/2013

## 2013-02-08 LAB — CBC
MCHC: 32.7 g/dL (ref 30.0–36.0)
RDW: 12.4 % (ref 11.5–15.5)
WBC: 4.6 10*3/uL (ref 4.0–10.5)

## 2013-02-08 LAB — COMPREHENSIVE METABOLIC PANEL
ALT: 14 U/L (ref 0–35)
AST: 16 U/L (ref 0–37)
Albumin: 2.4 g/dL — ABNORMAL LOW (ref 3.5–5.2)
Alkaline Phosphatase: 207 U/L — ABNORMAL HIGH (ref 39–117)
Potassium: 3.2 mEq/L — ABNORMAL LOW (ref 3.5–5.1)
Sodium: 136 mEq/L (ref 135–145)
Total Protein: 6.1 g/dL (ref 6.0–8.3)

## 2013-02-08 LAB — GLUCOSE, CAPILLARY
Glucose-Capillary: 106 mg/dL — ABNORMAL HIGH (ref 70–99)
Glucose-Capillary: 84 mg/dL (ref 70–99)
Glucose-Capillary: 91 mg/dL (ref 70–99)
Glucose-Capillary: 91 mg/dL (ref 70–99)
Glucose-Capillary: 92 mg/dL (ref 70–99)

## 2013-02-08 MED ORDER — POTASSIUM CHLORIDE CRYS ER 20 MEQ PO TBCR
40.0000 meq | EXTENDED_RELEASE_TABLET | Freq: Every day | ORAL | Status: DC
  Administered 2013-02-08 – 2013-02-09 (×2): 40 meq via ORAL
  Filled 2013-02-08 (×2): qty 2

## 2013-02-08 NOTE — Progress Notes (Signed)
CSW provided patient with bed offers. Patient states that she will discuss with her daughter and let CSW know of their choice.  Katriona Schmierer C. Braxden Lovering MSW, LCSW [phone]

## 2013-02-08 NOTE — Progress Notes (Signed)
Patient ID: Sheila Hurst, female   DOB: 1940/11/15, 72 y.o.   MRN: [id number] 11 Days Post-Op  Subjective: Had some nausea yesterday AM, but this improved and she was able to eat.    Objective: Vital signs in last 24 hours: Temp:  [98.4 F (36.9 C)-99 F (37.2 C)] 98.4 F (36.9 C) (06/09 0618) Pulse Rate:  [60-82] 60 (06/09 0618) Resp:  [16-18] 16 (06/09 0618) BP: (144-175)/(74-83) 144/83 mmHg (06/09 0618) SpO2:  [91 %-98 %] 95 % (06/09 0618) Last BM Date: 02/07/13  Intake/Output from previous day:   Intake/Output this shift:    GI: soft, nontender, nondistended, drains and staples removed.    Lab Results:   Recent Labs  02/07/13 0550 02/08/13 0425  WBC 5.3 4.6  HGB 9.9* 9.6*  HCT 28.8* 29.4*  PLT 281 269   BMET  Recent Labs  02/07/13 0550 02/08/13 0425  NA 137 136  K 3.1* 3.2*  CL 100 99  CO2 30 28  GLUCOSE 112* 94  BUN 8 9  CREATININE 0.54 0.65  CALCIUM 9.0 9.1   PT/INR No results found for this basename: LABPROT, INR,  in the last 72 hours ABG No results found for this basename: PHART, PCO2, PO2, HCO3,  in the last 72 hours  Studies/Results: No results found.  Anti-infectives: Anti-infectives   Start     Dose/Rate Route Frequency Ordered Stop   01/28/13 1500  cefOXitin (MEFOXIN) 1 g in dextrose 5 % 50 mL IVPB     1 g 100 mL/hr over 30 Minutes Intravenous Every 6 hours 01/28/13 1449 01/29/13 0300   01/28/13 0537  cefOXitin (MEFOXIN) 2 g in dextrose 5 % 50 mL IVPB     2 g 100 mL/hr over 30 Minutes Intravenous On call to O.R. 01/28/13 2841 01/28/13 0756      Assessment/Plan: s/p Procedure(s): LAPAROSCOPY DIAGNOSTIC,  WHIPPLE (N/A) WHIPPLE PROCEDURE (N/A) Add reglan today to help with nausea Calorie counts. Hopefully home tomorrow   LOS: 11 days    Madox Corkins 02/08/2013

## 2013-02-08 NOTE — Progress Notes (Signed)
Physical Therapy Treatment Patient Details Name: Sheila Hurst MRN: [id number] DOB: 15-May-1941 Today's Date: 02/08/2013 Time: 8657-8469 PT Time Calculation (min): 29 min  PT Assessment / Plan / Recommendation Comments on Treatment Session  POD # 11 WIPPLE.  Assisted pt OOb to amb in hallway then assist to BR.  Pt able to tolerate bath wash up while standing in sink. Assisted to recliner.  Pt progressing well.    Follow Up Recommendations  Home health PT     Does the patient have the potential to tolerate intense rehabilitation     Barriers to Discharge        Equipment Recommendations  Rolling walker with 5" wheels (if she still wants it)    Recommendations for Other Services    Frequency Min 3X/week   Plan Discharge plan remains appropriate    Precautions / Restrictions Precautions Precautions: Fall Precaution Comments: Abd incision Restrictions Weight Bearing Restrictions: No   Pertinent Vitals/Pain No c/o pain    Mobility  Bed Mobility Bed Mobility: Supine to Sit Supine to Sit: 4: Min guard Details for Bed Mobility Assistance: perfomrmed much better only required increased time Transfers Transfers: Sit to Stand;Stand to Sit Sit to Stand: 5: Supervision;4: Min guard;From toilet Stand to Sit: 4: Min guard;To toilet;To chair/3-in-1 Details for Transfer Assistance: good use of hands and safety tech Ambulation/Gait Ambulation/Gait Assistance: 4: Min assist;4: Min guard Ambulation Distance (Feet): 250 Feet Assistive device: None Ambulation/Gait Assistance Details: amb w/o AD and on RA sats avg 96%    Pt required increased time and occassionally used rail to steady self.  Gait Pattern: Step-to pattern;Decreased stride length;Narrow base of support;Shuffle;Trunk flexed Gait velocity: decreased    PT Goals    Visit Information  Last PT Received On: 02/08/13 Assistance Needed: +1    Subjective Data      Cognition       Balance     End of Session PT - End  of Session Equipment Utilized During Treatment: Gait belt Activity Tolerance: Patient tolerated treatment well Patient left: in chair;with call bell/phone within reach   Felecia Shelling  PTA [facility]  Acute  Rehab Pager      [phone]

## 2013-02-08 NOTE — Progress Notes (Signed)
Calorie count envelope has been hung on the patient's door. Please document percent consumed for each item on the patient's meal tray ticket and keep in envelope. Also document percent of any supplement or snack pt consumes and keep documentation in envelope for RD to review. RN and pt aware that calorie count is in progress.   Ian Malkin RD, LDN Inpatient Clinical Dietitian Pager: [phone] After Hours Pager: [phone]

## 2013-02-09 LAB — GLUCOSE, CAPILLARY
Glucose-Capillary: 120 mg/dL — ABNORMAL HIGH (ref 70–99)
Glucose-Capillary: 92 mg/dL (ref 70–99)
Glucose-Capillary: 97 mg/dL (ref 70–99)

## 2013-02-09 LAB — CBC
HCT: 31.4 % — ABNORMAL LOW (ref 36.0–46.0)
MCH: 29.2 pg (ref 26.0–34.0)
MCHC: 33.4 g/dL (ref 30.0–36.0)
MCV: 87.2 fL (ref 78.0–100.0)
RDW: 12.4 % (ref 11.5–15.5)
WBC: 5.4 10*3/uL (ref 4.0–10.5)

## 2013-02-09 LAB — COMPREHENSIVE METABOLIC PANEL
Albumin: 2.6 g/dL — ABNORMAL LOW (ref 3.5–5.2)
BUN: 8 mg/dL (ref 6–23)
Calcium: 9.1 mg/dL (ref 8.4–10.5)
Chloride: 100 mEq/L (ref 96–112)
Creatinine, Ser: 0.61 mg/dL (ref 0.50–1.10)
Total Bilirubin: 0.3 mg/dL (ref 0.3–1.2)

## 2013-02-09 MED ORDER — POTASSIUM CHLORIDE CRYS ER 20 MEQ PO TBCR: 40.0000 meq | EXTENDED_RELEASE_TABLET | Freq: Two times a day (BID) | ORAL | Status: DC

## 2013-02-09 MED ORDER — OXYCODONE HCL 5 MG PO TABS: 5.0000 mg | ORAL_TABLET | ORAL | Status: DC | PRN

## 2013-02-09 MED ORDER — METOCLOPRAMIDE HCL 10 MG PO TABS: 10.0000 mg | ORAL_TABLET | Freq: Three times a day (TID) | ORAL | Status: DC

## 2013-02-09 MED ORDER — PROMETHAZINE HCL 12.5 MG PO TABS: 12.5000 mg | ORAL_TABLET | Freq: Four times a day (QID) | ORAL | Status: DC | PRN

## 2013-02-09 NOTE — Progress Notes (Signed)
Met with pt at bedside to discuss d/c needs. Pt informed me that she has been ambulating to the bathroom independently and has also been giving herself a bed bath. She stated her two sisters who are retired will be stating with her and will be assisting her if needed. I observed her ambulating in the hallway independently with the NT walking by her side. There are no d/c needs identified at this point.  Algernon Huxley RN BSN   [phone]

## 2013-02-09 NOTE — Discharge Summary (Signed)
Physician Discharge Summary  Patient ID: Sheila Hurst MRN: [id number] DOB/AGE: 1941-04-29 72 y.o.  Admit date: 01/28/2013 Discharge date: 02/11/2013  Admission Diagnoses: Patient Active Problem List   Diagnosis Date Noted  . 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 Hyperlipidemia   . Arthritis   . GERD (gastroesophageal reflux disease)   . Hypertension   . Pancreatic mass 09/15/2012    Discharge Diagnoses:  Same  Discharged Condition: stable  Hospital Course:  Pt was admitted to the ICU following a pancreaticoduodenectomy for pancreatic cancer.  She had some hypertension initially that was controlled with hydralazine and metoprolol.  She had good pain control with PCA.  She was eventually able to transfer to the floor with telemetry.  She had her NGT d/c'd and started on clears on POD 4.  She was slowly advanced on her diet as her bowel function improved.  She started having nausea and vomiting, and was placed on Reglan.  This helped, and she was able to readvance her diet.  She was transitioned to oral pain medication.  She was able to urinate spontaneously.  PT was consulted, and she was able to ambulate.  Her pathology demonstrated that she had no residual carcinoma.  She was discharged to home in stable condition.  Drains and staples were removed prior to d/c.    Consults: PT consult  Significant Diagnostic Studies: labs: HCT 31.4 prior to d/c.    Treatments: surgery: whipple  Discharge Exam: Blood pressure 150/78, pulse 69, temperature 98.5 F (36.9 C), temperature source Oral, resp. rate 18, height 5\' 4"  (1.626 m), weight 165 lb 12.6 oz (75.2 kg), SpO2 92.00%. General appearance: alert, cooperative and no distress Resp: breathing comfortably Cardio: regular rate and rhythm GI: soft, approp tender, no erythema or drainage, non distended Extremities: extremities normal, atraumatic, no cyanosis or  edema  Disposition: 01-Home or Self Care      Discharge Orders   Future Appointments Provider Department Dept Phone   02/19/2013 11:00 AM Ladene Artist, MD [facility] [phone]   Future Orders Complete By Expires     Call MD for:  difficulty breathing, headache or visual disturbances  As directed     Call MD for:  persistant dizziness or light-headedness  As directed     Call MD for:  persistant nausea and vomiting  As directed     Call MD for:  redness, tenderness, or signs of infection (pain, swelling, redness, odor or green/yellow discharge around incision site)  As directed     Call MD for:  severe uncontrolled pain  As directed     Call MD for:  temperature >100.4  As directed     Diet - low sodium heart healthy  As directed     Increase activity slowly  As directed         Medication List    TAKE these medications       acetaminophen 500 MG tablet  Commonly known as:  TYLENOL  Take 500 mg by mouth every 6 (six) hours as needed for pain.     atenolol 50 MG tablet  Commonly known as:  TENORMIN     diltiazem 90 MG tablet  Commonly known as:  CARDIZEM  Take 90 mg by mouth every morning.     enoxaparin 120 MG/0.8ML injection  Commonly known as:  LOVENOX  Inject 120 mg into the skin daily.  GLUCOSAMINE 1500 COMPLEX PO  Take 1 capsule by mouth daily.     metoCLOPramide 10 MG tablet  Commonly known as:  REGLAN  Take 1 tablet (10 mg total) by mouth 3 (three) times daily before meals.     oxyCODONE 5 MG immediate release tablet  Commonly known as:  Oxy IR/ROXICODONE  Take 1-2 tablets (5-10 mg total) by mouth every 4 (four) hours as needed for pain.     pantoprazole 40 MG tablet  Commonly known as:  PROTONIX  Take 1 tablet (40 mg total) by mouth daily.     potassium chloride SA 20 MEQ tablet  Commonly known as:  K-DUR,KLOR-CON  Take 2 tablets (40 mEq total) by mouth 2 (two) times daily.     prochlorperazine 10 MG tablet   Commonly known as:  COMPAZINE     promethazine 12.5 MG tablet  Commonly known as:  PHENERGAN  Take 1 tablet (12.5 mg total) by mouth every 6 (six) hours as needed for nausea.     triamterene-hydrochlorothiazide 37.5-25 MG per tablet  Commonly known as:  MAXZIDE-25  Take 1 tablet by mouth daily.       Follow-up Information   Follow up with [facility], MD In 3 weeks.   Contact information:   [address] [place] [address] [phone]       Follow up with Thora Lance, MD In 4 weeks. (to assess blood sugars and hypertension)    Contact information:   [address] [place] [address] [phone]       Signed: Kehlani Vancamp 02/11/2013, 3:16 PM

## 2013-02-09 NOTE — Progress Notes (Signed)
CSW met with patient. Patient will be going home with home health today. No further CSW needs needed. CSW signing off.  Khyler Urda C. Vieno Tarrant MSW, LCSW [phone]

## 2013-02-09 NOTE — Progress Notes (Signed)
Calorie Count Note  48 hour calorie count ordered.  Diet: Fat Modified Supplements: None  Breakfast: 412 kcal, 11 grams protein Lunch: 460 kcal, 13 grams protein Dinner: 454 kcal, 18 grams protein Supplements: none  Total intake: 1326 kcal (80% of minimum estimated needs)  42 grams protein (56% of minimum estimated needs)  Nutrition Dx: Inadequate oral intake related to recent surgery as evidenced by pt meeting <90% of estimated kcal and protein needs  Goal: Pt to meet >/= 90% of their estimated nutrition needs  Intervention:  Encourage more frequent meals/snacks due to getting full fast Diet education provided 6/6 Recommend advancing to regular diet  Ian Malkin RD, LDN Inpatient Clinical Dietitian Pager: [phone] After Hours Pager: [phone]

## 2013-02-12 ENCOUNTER — Encounter (INDEPENDENT_AMBULATORY_CARE_PROVIDER_SITE_OTHER): Payer: Medicare Other | Admitting: General Surgery

## 2013-02-12 ENCOUNTER — Ambulatory Visit (INDEPENDENT_AMBULATORY_CARE_PROVIDER_SITE_OTHER): Payer: Medicare Other | Admitting: General Surgery

## 2013-02-12 ENCOUNTER — Encounter (INDEPENDENT_AMBULATORY_CARE_PROVIDER_SITE_OTHER): Payer: Self-pay | Admitting: General Surgery

## 2013-02-12 VITALS — BP 130/74 | HR 82 | Temp 97.6°F | Resp 14 | Ht 64.0 in | Wt 147.4 lb

## 2013-02-12 DIAGNOSIS — C259 Malignant neoplasm of pancreas, unspecified: Secondary | ICD-10-CM

## 2013-02-12 NOTE — Assessment & Plan Note (Signed)
Doing remarkably well  Advised continued increase in protein intake.  Will follow up in 3 weeks.

## 2013-02-12 NOTE — Patient Instructions (Signed)
Please eat 6 times per day.  OK to eat anything, but would not eat large portions of really rich or really fatty foods.    Walk!! Focus on protein intake.

## 2013-02-12 NOTE — Progress Notes (Signed)
HISTORY: Patient is status post Whipple for pancreatic adenocarcinoma. She had neoadjuvant chemoradiation and actually had no residual tumor on her final pathology. She was discharged 3 days ago. She has been doing very well. She is taking pain medication at night only. She is getting around pretty well on her own. Her 2 sisters are staying with her and she is eating okay.  She denies fevers, chills, jaundice. She denies nausea and vomiting. She also denies diarrhea.  She is not having any dizziness    EXAM: General:  Alert and oriented.   Incision:  Incision healing well.  Steri strips in place.  No erythema or drainage.     PATHOLOGY: Diagnosis 1. Whipple procedure/resection, with gallbladder - NO RESIDUAL TUMOR IDENTIFIED. - EXTENSIVE STROMAL FIBROSIS, CONSISTENT WITH POST-TREATMENT EFFECT. - ALL FINAL RESECTION MARGIN, NEGATIVE FOR ATYPIA OR MALIGNANCY. - FIVE LYMPH NODES, NEGATIVE FOR METASTATIC CARCINOMA (0/5). - CHRONIC CHOLECYSTITIS. - PLEASE SEE ONCOLOGY TEMPLATE FOR DETAILS. 2. Lymph nodes, regional resection, portal - ONE BENIGN LYMPH NODE, NEGATIVE FOR METASTATIC CARCINOMA (0/1).   ASSESSMENT AND PLAN:   Pancreatic adenocarcinoma Doing remarkably well  Advised continued increase in protein intake.  Will follow up in 3 weeks.        Maudry Diego, MD Surgical Oncology, General & Endocrine Surgery [facility]  Thora Lance, MD Manus Gunning Bryan Lemma, MD

## 2013-02-16 ENCOUNTER — Telehealth (INDEPENDENT_AMBULATORY_CARE_PROVIDER_SITE_OTHER): Payer: Self-pay

## 2013-02-16 NOTE — Telephone Encounter (Signed)
[facility] would like verbal order to change pt's potassium to a powder.  She is unable to swallow the capsules.  Please call Monica at [phone] today.

## 2013-02-18 ENCOUNTER — Telehealth (INDEPENDENT_AMBULATORY_CARE_PROVIDER_SITE_OTHER): Payer: Self-pay | Admitting: *Deleted

## 2013-02-18 NOTE — Telephone Encounter (Signed)
Patient's daughter called to ask about changing patients potassium over to the powder form.  Daughter also states that she had spoken to Dr. Donell Beers previously regarding a "pill" the patient could take if she lost her appetite.  Daughter states that patient is no longer eating anything and wants to look into this option further and see if it can be called in.  Explained to daughter that Dr. Donell Beers is unavailable at this time however we will try to speak with Dr. Carolynne Edouard who also saw patient while inpatient to see if he is willing to approve.

## 2013-02-18 NOTE — Telephone Encounter (Signed)
Contacted the pt's daughter and told her that Dr. Maisie Fus would consider prescribing the pt's potassium, but only after a stat BMET was drawn.  The daughter refused that option.  I explained that I would speak with Dr. Donell Beers first thing Monday morning to request the potassium (powder) Rx along with an appetite stimulant.  Daughter was fine with this plan and agreed to wait until Monday to hear from [place].

## 2013-02-19 ENCOUNTER — Telehealth: Payer: Self-pay | Admitting: Oncology

## 2013-02-19 ENCOUNTER — Ambulatory Visit (HOSPITAL_BASED_OUTPATIENT_CLINIC_OR_DEPARTMENT_OTHER): Payer: Medicare Other | Admitting: Oncology

## 2013-02-19 VITALS — BP 133/70 | HR 71 | Temp 97.8°F | Resp 18 | Ht 64.0 in | Wt 142.5 lb

## 2013-02-19 DIAGNOSIS — C259 Malignant neoplasm of pancreas, unspecified: Secondary | ICD-10-CM

## 2013-02-19 NOTE — Telephone Encounter (Signed)
Gave pt appt for July 2014 ML visit

## 2013-02-19 NOTE — Progress Notes (Signed)
   [facility]    OFFICE PROGRESS NOTE   INTERVAL HISTORY:   She returns as scheduled. Ms. Protzman underwent a Whipple procedure on 01/28/2013. She continues to recover from surgery. She remains "weak ". She has difficulty eating. She is scheduled to see Dr. Donell Beers in approximately 2 weeks. The pathology from the Whipple procedure revealed no residual tumor. 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 The resection margins were negative and 6 lymph nodes were negative for metastatic carcinoma.  Objective:  Vital signs in last 24 hours:  Blood pressure 133/70, pulse 71, temperature 97.8 F (36.6 C), temperature source Oral, resp. rate 18, height 5\' 4"  (1.626 m), weight 142 lb 8 oz (64.638 kg).    HEENT: Neck without mass Resp: Lungs clear bilateral Cardio: Regular rate and rhythm GI: No hepatomegaly, nontender, no mass, healing midline incision Vascular: No leg edema   Lab Results:  CA 19-9 on 01/21/2013-51.4   Medications: I have reviewed the patient's current medications.  Assessment/Plan: 1. Adenocarcinoma of the pancreas, Mass. in the neck of the pancreas, status post EUS guided biopsy 09/15/2012 with the cytology confirming malignant cells consistent with adenocarcinoma, T3 N0 by ultrasound. 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 Elevated CA 19-9. She began radiation and concurrent Xeloda chemotherapy on 10/19/2012, radiation completed 11/30/2012 -Whipple procedure 01/28/2013 with no residual carcinoma seen and stromal fibrosis consistent with post treatment effect 2. Pain secondary to pancreas cancer. Improved. 3. Right liver cyst. Smaller on MRI 09/28/2012 compared to 11/03/2008. 4. Hypertension currently taking Cardizem and Maxide. 5. Hyperlipidemia. 6. Family history of pancreas cancer (paternal aunt). 7. Anorexia/weight loss. 8. Bradycardia when here on  11/11/2012. Atenolol was placed on hold. 9. Abdominal pain with CT scan 11/24/2012 showing decrease in the size of the pancreatic head mass and fairly marked antral wall thickening of the stomach with surrounding inflammatory change. She is taking Dilaudid. Due to the skin rash and "woozy" sensation after taking Dilaudid the Dilaudid was discontinued.Marland Kitchen She was given a new prescription for oxycodone 5 mg 1-2 tablets every 4 hours as needed and she was started on Protonix 40 mg daily. 10. Right leg DVT documented on a Doppler 11/26/2012-now on Lovenox. 11. Skin rash of unclear etiology. Question medication related. She was instructed to discontinue Dilaudid as noted above and also to discontinue Xeloda. The rash resolved    Disposition:  Ms. Buchta is recovering from the Whipple procedure. The surgery confirmed a complete pathologic response. This is unusual with Xeloda/radiation. I discussed the indication for adjuvant therapy with Ms Fellows and her daughter. I explained the lack of randomized data to confirm a clear survival benefit with adjuvant therapy in this setting. However there is evidence of a benefit from adjuvant gemcitabine chemotherapy. I recommend adjuvant gemcitabine.  We reviewed the toxicities associated with gemcitabine including the chance for hematologic toxicity, fever, an allergic reaction, skin rash, and pneumonitis.  She needs to recover further from surgery prior to receiving adjuvant chemotherapy. Ms. Logue will return for an office visit and further discussion in 3 weeks. We will check a baseline CA 19-9 prior to beginning adjuvant chemotherapy.   Thornton Papas, MD  02/19/2013  5:47 PM

## 2013-02-22 NOTE — Telephone Encounter (Signed)
Patient's daughter is calling for RX Potassium powder and appetite stimulate . She was advised to call today Nurse would ask Dr. Donell Beers for Rx

## 2013-02-23 ENCOUNTER — Telehealth (INDEPENDENT_AMBULATORY_CARE_PROVIDER_SITE_OTHER): Payer: Self-pay | Admitting: General Surgery

## 2013-02-23 NOTE — Telephone Encounter (Signed)
Pharmacy calling back.  Megace will need insurance prior approval.  Potassium powder will cost pt $100.  Changed to KCl extended release liquid. 40 meq bid x 1 month.  Will try to complete prior auth for Megace.

## 2013-02-23 NOTE — Telephone Encounter (Signed)
Rx for Potassium powder and Megace po bid x 1 month no refill called to [facility] pharmacy per Dr. Donell Beers.  Pt made aware.

## 2013-02-23 NOTE — Telephone Encounter (Signed)
Arlys John from [facility] called and stated.Marland KitchenMarland KitchenThe Potassium that was going to be used is unavailable because the Pharmacy ordered so long ago, when they tried to order again the product was triple the amount. Pharmacist stated the most reasonable Potassium would be 40 meq tablets  BID. Pharmacist wants to know if this will be ok. Call back number [phone].

## 2013-02-26 ENCOUNTER — Other Ambulatory Visit (INDEPENDENT_AMBULATORY_CARE_PROVIDER_SITE_OTHER): Payer: Self-pay | Admitting: General Surgery

## 2013-03-01 ENCOUNTER — Telehealth (INDEPENDENT_AMBULATORY_CARE_PROVIDER_SITE_OTHER): Payer: Self-pay | Admitting: General Surgery

## 2013-03-01 NOTE — Telephone Encounter (Signed)
Sandy with [facility] called to check on the status for a prior authorization on Megace. Please advise. [phone].

## 2013-03-09 ENCOUNTER — Encounter (INDEPENDENT_AMBULATORY_CARE_PROVIDER_SITE_OTHER): Payer: Self-pay | Admitting: General Surgery

## 2013-03-09 ENCOUNTER — Ambulatory Visit (INDEPENDENT_AMBULATORY_CARE_PROVIDER_SITE_OTHER): Payer: Medicare Other | Admitting: General Surgery

## 2013-03-09 VITALS — BP 128/74 | HR 72 | Temp 98.0°F | Resp 16 | Ht 64.0 in | Wt 140.0 lb

## 2013-03-09 DIAGNOSIS — C259 Malignant neoplasm of pancreas, unspecified: Secondary | ICD-10-CM

## 2013-03-09 NOTE — Patient Instructions (Signed)
Continue eating!!!  Follow up with me in 3 months.  Follow up with Dr. Truett Perna this week.

## 2013-03-09 NOTE — Assessment & Plan Note (Signed)
Weight is finally stabilizing.   Good energy level.  Has appt with Dr. Truett Perna to see if any adjuvant tx needed.

## 2013-03-09 NOTE — Progress Notes (Signed)
HISTORY: Pt is doing well s/p whipple.  She does still have issues eating a lot.  She has lost 2 pounds, but this is over 5 weeks.  She was losing much more weight more rapidly.  Her energy level is starting to improve.      EXAM: General:  Alert and oriented Incision:  2 staples still there.  Removed.     PATHOLOGY: No residual cancer.     ASSESSMENT AND PLAN:   Pancreatic adenocarcinoma Weight is finally stabilizing.   Good energy level.  Has appt with Dr. Sherrill to see if any adjuvant tx needed.        Sheila Hurst [name], MD Surgical Oncology, General & Endocrine Surgery [facility]  EHINGER,ROBERT R, MD Ehinger, Robert R, MD    

## 2013-03-10 ENCOUNTER — Telehealth (INDEPENDENT_AMBULATORY_CARE_PROVIDER_SITE_OTHER): Payer: Self-pay | Admitting: General Surgery

## 2013-03-10 NOTE — Telephone Encounter (Signed)
Spoke with Fleet Contras  [facility] requesting  Pre Berkley Harvey for megace  10 ml po bid x2  icd 157.2 with weight loss  Pending pre auth for review should hear in 48 to 72 hours . [facility] rx tara was notified  At [phone]

## 2013-03-10 NOTE — Telephone Encounter (Signed)
Ref #[id number]

## 2013-03-11 ENCOUNTER — Ambulatory Visit (HOSPITAL_BASED_OUTPATIENT_CLINIC_OR_DEPARTMENT_OTHER): Payer: Medicare Other | Admitting: Nurse Practitioner

## 2013-03-11 VITALS — BP 153/77 | HR 62 | Temp 97.4°F | Resp 18 | Ht 64.0 in | Wt 142.8 lb

## 2013-03-11 DIAGNOSIS — C259 Malignant neoplasm of pancreas, unspecified: Secondary | ICD-10-CM

## 2013-03-11 NOTE — Progress Notes (Addendum)
OFFICE PROGRESS NOTE  Interval history:  Sheila Hurst returns as scheduled. Appetite continues to be marginal. Her weight has stabilized. She plans on beginning a trial of Megace today. She denies nausea/vomiting. Abdominal pain related to surgery is improving. She continues Lovenox. She denies bleeding.   Objective: Blood pressure 153/77, pulse 62, temperature 97.4 F (36.3 C), temperature source Oral, resp. rate 18, height 5\' 4"  (1.626 m), weight 142 lb 12.8 oz (64.774 kg).  Oropharynx is without thrush or ulceration. Lungs are clear. Regular cardiac rhythm. Abdomen is soft with mild tenderness at the upper abdomen. Healed midline incision. No hepatomegaly. Extremities are without edema.  Lab Results: Lab Results  Component Value Date   WBC 5.4 02/09/2013   HGB 10.5* 02/09/2013   HCT 31.4* 02/09/2013   MCV 87.2 02/09/2013   PLT 331 02/09/2013    Chemistry:    Chemistry      Component Value Date/Time   NA 136 02/09/2013 0509   NA 142 10/05/2012 1234   K 3.3* 02/09/2013 0509   K 3.2* 10/05/2012 1234   CL 100 02/09/2013 0509   CL 104 10/05/2012 1234   CO2 29 02/09/2013 0509   CO2 27 10/05/2012 1234   BUN 8 02/09/2013 0509   BUN 19.0 10/05/2012 1234   CREATININE 0.61 02/09/2013 0509   CREATININE 0.8 10/05/2012 1234      Component Value Date/Time   CALCIUM 9.1 02/09/2013 0509   CALCIUM 9.2 10/05/2012 1234   ALKPHOS 193* 02/09/2013 0509   ALKPHOS 119 10/05/2012 1234   AST 15 02/09/2013 0509   AST 16 10/05/2012 1234   ALT 14 02/09/2013 0509   ALT 14 10/05/2012 1234   BILITOT 0.3 02/09/2013 0509   BILITOT 0.30 10/05/2012 1234       Studies/Results: No results found.  Medications: I have reviewed the patient's current medications.  Assessment/Plan:  1. Adenocarcinoma of the pancreas; mass in the neck of the pancreas, status post EUS guided biopsy 09/15/2012 with the cytology confirming malignant cells consistent with adenocarcinoma, T3 N0 by ultrasound. Staging CT and MRI concerning for abutment of  the confluence of the superior mesenteric vein and portal vein. Elevated CA 19-9. She began radiation and concurrent Xeloda chemotherapy on 10/19/2012, radiation completed 11/30/2012 -Whipple procedure 01/28/2013 with no residual carcinoma seen and stromal fibrosis consistent with post treatment effect.  2. Pain secondary to pancreas cancer. Improved. 3. Right liver cyst. Smaller on MRI 09/28/2012 compared to 11/03/2008. 4. Hypertension currently taking Cardizem and Maxide. 5. Hyperlipidemia. 6. Family history of pancreas cancer (paternal aunt). 7. Anorexia/weight loss. Weight appears to have stabilized. She plans on beginning a trial of Megace. 8. Bradycardia when here on 11/11/2012. Atenolol was placed on hold. 9. Abdominal pain with CT scan 11/24/2012 showing decrease in the size of the pancreatic head mass and fairly marked antral wall thickening of the stomach with surrounding inflammatory change. She is taking Dilaudid. Due to the skin rash and "woozy" sensation after taking Dilaudid the Dilaudid was discontinued.Marland Kitchen She was given a new prescription for oxycodone 5 mg 1-2 tablets every 4 hours as needed and she was started on Protonix 40 mg daily. 10. Right leg DVT documented on a Doppler 11/26/2012-now on Lovenox. 11. Skin rash of unclear etiology. Question medication related. She was instructed to discontinue Dilaudid and also to discontinue Xeloda. The rash resolved.  Disposition-Sheila Hurst continues to recover from the recent surgery. Her weight has stabilized. Dr. Truett Perna recommends a course of adjuvant gemcitabine chemotherapy. We reviewed potential  toxicities associated with gemcitabine including myelosuppression, nausea, fever, skin rash and pneumonitis. The gemcitabine will be given weekly x3 followed by a one-week break for 4 cycles. We anticipate she will begin the gemcitabine in 2 weeks on 03/25/2013. We will see her in followup prior to the cycle 1 week 3 treatment on  04/08/2013.  We discussed having a Port-A-Cath placed for administration of the chemotherapy. She is agreeable. We are making a referral to Dr. Donell Beers.  Patient seen with Dr. Truett Perna.    Lonna Cobb ANP/GNP-BC   I discussed adjuvant gemcitabine chemotherapy with Sheila Hurst and her daughter. We reviewed the potential toxicities associated with gemcitabine. I reviewed the rationale behind adjuvant chemotherapy.  She agrees to a course of gemcitabine.  Sheila Hurst has a history of a lower extremity deep vein thrombosis. She remains at increased risk for DVT following the Whipple procedure and with adjuvant chemotherapy.She is now taking Megace. I recommend continuing Lovenox anticoagulation throughout the course of adjuvant chemotherapy. Sheila Hurst agrees.  Lovenox can be held for placement of a Port-A-Cath.   Mancel Bale, M.D.

## 2013-03-12 ENCOUNTER — Telehealth: Payer: Self-pay | Admitting: Oncology

## 2013-03-12 ENCOUNTER — Other Ambulatory Visit (INDEPENDENT_AMBULATORY_CARE_PROVIDER_SITE_OTHER): Payer: Self-pay | Admitting: General Surgery

## 2013-03-12 ENCOUNTER — Telehealth: Payer: Self-pay | Admitting: *Deleted

## 2013-03-12 NOTE — Telephone Encounter (Signed)
Per staff message and POF I have scheduled appts.  JMW  

## 2013-03-12 NOTE — Telephone Encounter (Signed)
Per LT this is pt second round of tx pt does not need chemo edu.Marland KitchenMarland Kitchen

## 2013-03-15 ENCOUNTER — Encounter (HOSPITAL_BASED_OUTPATIENT_CLINIC_OR_DEPARTMENT_OTHER): Payer: Self-pay | Admitting: *Deleted

## 2013-03-15 ENCOUNTER — Telehealth: Payer: Self-pay | Admitting: Oncology

## 2013-03-15 ENCOUNTER — Telehealth (INDEPENDENT_AMBULATORY_CARE_PROVIDER_SITE_OTHER): Payer: Self-pay | Admitting: *Deleted

## 2013-03-15 ENCOUNTER — Telehealth: Payer: Self-pay | Admitting: Dietician

## 2013-03-15 NOTE — Telephone Encounter (Signed)
Called and spoke to patient regarding questions prior to [facility] insert.  Patient states she does take Lovenox injections daily PM 120mg .  Spoke to [place] MD who instructs to have patient not take Lovenox injection on 03/17/13 the night prior to surgery.  Patient updated at this time and states understanding.

## 2013-03-15 NOTE — Telephone Encounter (Signed)
Per Britta Mccreedy the nurse she sched pt to see Dr. Donell Beers on 7.29.14 @ 2:45pm...she will contact pt with d/t for visit

## 2013-03-15 NOTE — Telephone Encounter (Signed)
Brief Outpatient Oncology Nutrition Note  Patient has been identified to be at risk on malnutrition screen.  Wt Readings from Last 10 Encounters:  03/11/13 142 lb 12.8 oz (64.774 kg)  03/09/13 140 lb (63.504 kg)  02/19/13 142 lb 8 oz (64.638 kg)  02/12/13 147 lb 6.4 oz (66.86 kg)  01/30/13 165 lb 12.6 oz (75.2 kg)  01/30/13 165 lb 12.6 oz (75.2 kg)  01/21/13 157 lb 8 oz (71.442 kg)  01/21/13 158 lb 1.6 oz (71.714 kg)  12/31/12 158 lb 1.6 oz (71.714 kg)  12/23/12 159 lb 8 oz (72.349 kg)    Called patient due to weight loss.  Patient [facility] illness and then was hospitalized at the end of May.  Intake at that time was very poor based on calorie count records during that admit.  Patient has since been started on Megace.  Patient reports that this is working very well and that she now has very good intake.  Drinks the "Naked Juice" but no other supplements.  Discussed using the variety with protein when intake is down.  Patient had no needs at this time.  Patient to contact us with any questions.  Oran Rein, RD, LDN\

## 2013-03-15 NOTE — Progress Notes (Signed)
Pt just had whipple procedure- Will need istat

## 2013-03-18 ENCOUNTER — Ambulatory Visit (HOSPITAL_COMMUNITY): Payer: Medicare Other

## 2013-03-18 ENCOUNTER — Ambulatory Visit (HOSPITAL_BASED_OUTPATIENT_CLINIC_OR_DEPARTMENT_OTHER)
Admission: RE | Admit: 2013-03-18 | Discharge: 2013-03-18 | Disposition: A | Discharge status: Home / Self Care | Payer: Medicare Other | Source: Ambulatory Visit | Attending: General Surgery | Admitting: General Surgery

## 2013-03-18 ENCOUNTER — Encounter (HOSPITAL_BASED_OUTPATIENT_CLINIC_OR_DEPARTMENT_OTHER): Payer: Self-pay | Admitting: Anesthesiology

## 2013-03-18 ENCOUNTER — Encounter (HOSPITAL_BASED_OUTPATIENT_CLINIC_OR_DEPARTMENT_OTHER): Payer: Self-pay | Admitting: *Deleted

## 2013-03-18 ENCOUNTER — Ambulatory Visit (HOSPITAL_BASED_OUTPATIENT_CLINIC_OR_DEPARTMENT_OTHER): Payer: Medicare Other | Admitting: Anesthesiology

## 2013-03-18 ENCOUNTER — Encounter (HOSPITAL_BASED_OUTPATIENT_CLINIC_OR_DEPARTMENT_OTHER)
Admission: RE | Disposition: A | Discharge status: Home / Self Care | Payer: Self-pay | Source: Ambulatory Visit | Attending: General Surgery

## 2013-03-18 DIAGNOSIS — K219 Gastro-esophageal reflux disease without esophagitis: Secondary | ICD-10-CM | POA: Insufficient documentation

## 2013-03-18 DIAGNOSIS — C259 Malignant neoplasm of pancreas, unspecified: Secondary | ICD-10-CM

## 2013-03-18 DIAGNOSIS — Z79899 Other long term (current) drug therapy: Secondary | ICD-10-CM | POA: Insufficient documentation

## 2013-03-18 DIAGNOSIS — I10 Essential (primary) hypertension: Secondary | ICD-10-CM | POA: Insufficient documentation

## 2013-03-18 DIAGNOSIS — Z90410 Acquired total absence of pancreas: Secondary | ICD-10-CM | POA: Insufficient documentation

## 2013-03-18 DIAGNOSIS — E785 Hyperlipidemia, unspecified: Secondary | ICD-10-CM | POA: Insufficient documentation

## 2013-03-18 DIAGNOSIS — C257 Malignant neoplasm of other parts of pancreas: Secondary | ICD-10-CM | POA: Insufficient documentation

## 2013-03-18 DIAGNOSIS — Z7901 Long term (current) use of anticoagulants: Secondary | ICD-10-CM | POA: Insufficient documentation

## 2013-03-18 DIAGNOSIS — Z86718 Personal history of other venous thrombosis and embolism: Secondary | ICD-10-CM | POA: Insufficient documentation

## 2013-03-18 DIAGNOSIS — I498 Other specified cardiac arrhythmias: Secondary | ICD-10-CM | POA: Insufficient documentation

## 2013-03-18 HISTORY — DX: Presence of dental prosthetic device (complete) (partial): Z97.2

## 2013-03-18 HISTORY — DX: Presence of spectacles and contact lenses: Z97.3

## 2013-03-18 HISTORY — PX: PORTACATH PLACEMENT: SHX2246

## 2013-03-18 LAB — POCT I-STAT, CHEM 8
HCT: 36 % (ref 36.0–46.0)
Hemoglobin: 12.2 g/dL (ref 12.0–15.0)
Potassium: 3 mEq/L — ABNORMAL LOW (ref 3.5–5.1)
Sodium: 142 mEq/L (ref 135–145)

## 2013-03-18 SURGERY — INSERTION, TUNNELED CENTRAL VENOUS DEVICE, WITH PORT
Anesthesia: General | Site: Chest | Wound class: Clean

## 2013-03-18 MED ORDER — PROPOFOL 10 MG/ML IV BOLUS
INTRAVENOUS | Status: DC | PRN
  Administered 2013-03-18: 160 mg via INTRAVENOUS

## 2013-03-18 MED ORDER — HEPARIN (PORCINE) IN NACL 2-0.9 UNIT/ML-% IJ SOLN
INTRAMUSCULAR | Status: DC | PRN
  Administered 2013-03-18: 500 mL via INTRAVENOUS

## 2013-03-18 MED ORDER — BUPIVACAINE-EPINEPHRINE 0.5% -1:200000 IJ SOLN
INTRAMUSCULAR | Status: DC | PRN
  Administered 2013-03-18: 10 mL

## 2013-03-18 MED ORDER — SODIUM CHLORIDE 0.9 % IV SOLN: 250.0000 mL | INTRAVENOUS | Status: DC | PRN

## 2013-03-18 MED ORDER — METOCLOPRAMIDE HCL 5 MG/ML IJ SOLN
INTRAMUSCULAR | Status: DC | PRN
  Administered 2013-03-18: 10 mg via INTRAVENOUS

## 2013-03-18 MED ORDER — CEFAZOLIN SODIUM-DEXTROSE 2-3 GM-% IV SOLR
2.0000 g | INTRAVENOUS | Status: AC
  Administered 2013-03-18: 2 g via INTRAVENOUS

## 2013-03-18 MED ORDER — LIDOCAINE HCL (CARDIAC) 20 MG/ML IV SOLN
INTRAVENOUS | Status: DC | PRN
  Administered 2013-03-18: 50 mg via INTRAVENOUS

## 2013-03-18 MED ORDER — ACETAMINOPHEN 650 MG RE SUPP: 650.0000 mg | RECTAL | Status: DC | PRN

## 2013-03-18 MED ORDER — HEPARIN SOD (PORK) LOCK FLUSH 100 UNIT/ML IV SOLN
INTRAVENOUS | Status: DC | PRN
  Administered 2013-03-18: 500 [IU] via INTRAVENOUS

## 2013-03-18 MED ORDER — FENTANYL CITRATE 0.05 MG/ML IJ SOLN: 25.0000 ug | INTRAMUSCULAR | Status: DC | PRN

## 2013-03-18 MED ORDER — METOCLOPRAMIDE HCL 5 MG/ML IJ SOLN: 10.0000 mg | Freq: Once | INTRAMUSCULAR | Status: DC | PRN

## 2013-03-18 MED ORDER — DEXAMETHASONE SODIUM PHOSPHATE 4 MG/ML IJ SOLN
INTRAMUSCULAR | Status: DC | PRN
  Administered 2013-03-18: 10 mg via INTRAVENOUS

## 2013-03-18 MED ORDER — ACETAMINOPHEN 10 MG/ML IV SOLN: 1000.0000 mg | Freq: Once | INTRAVENOUS | Status: DC | PRN

## 2013-03-18 MED ORDER — HYDROCODONE-ACETAMINOPHEN 5-325 MG PO TABS: 1.0000 | ORAL_TABLET | ORAL | Status: DC | PRN

## 2013-03-18 MED ORDER — LACTATED RINGERS IV SOLN
INTRAVENOUS | Status: DC
  Administered 2013-03-18 (×3): via INTRAVENOUS

## 2013-03-18 MED ORDER — SODIUM CHLORIDE 0.9 % IJ SOLN: 3.0000 mL | INTRAMUSCULAR | Status: DC | PRN

## 2013-03-18 MED ORDER — OXYCODONE HCL 5 MG PO TABS
5.0000 mg | ORAL_TABLET | ORAL | Status: DC | PRN
  Administered 2013-03-18: 5 mg via ORAL

## 2013-03-18 MED ORDER — ACETAMINOPHEN 325 MG PO TABS: 650.0000 mg | ORAL_TABLET | ORAL | Status: DC | PRN

## 2013-03-18 MED ORDER — FENTANYL CITRATE 0.05 MG/ML IJ SOLN
INTRAMUSCULAR | Status: DC | PRN
  Administered 2013-03-18: 50 ug via INTRAVENOUS
  Administered 2013-03-18: 25 ug via INTRAVENOUS

## 2013-03-18 MED ORDER — SODIUM CHLORIDE 0.9 % IJ SOLN: 3.0000 mL | Freq: Two times a day (BID) | INTRAMUSCULAR | Status: DC

## 2013-03-18 SURGICAL SUPPLY — 42 items
ADH SKN CLS APL DERMABOND .7 (GAUZE/BANDAGES/DRESSINGS) ×1
BAG DECANTER FOR FLEXI CONT (MISCELLANEOUS) ×2 IMPLANT
BLADE HEX COATED 2.75 (ELECTRODE) ×2 IMPLANT
BLADE SURG 11 STRL SS (BLADE) ×2 IMPLANT
BLADE SURG 15 STRL LF DISP TIS (BLADE) ×1 IMPLANT
BLADE SURG 15 STRL SS (BLADE) ×2
CHLORAPREP W/TINT 26ML (MISCELLANEOUS) ×2 IMPLANT
CLOTH BEACON ORANGE TIMEOUT ST (SAFETY) ×2 IMPLANT
COVER MAYO STAND STRL (DRAPES) ×2 IMPLANT
COVER TABLE BACK 60X90 (DRAPES) ×2 IMPLANT
DECANTER SPIKE VIAL GLASS SM (MISCELLANEOUS) IMPLANT
DERMABOND ADVANCED (GAUZE/BANDAGES/DRESSINGS) ×1
DERMABOND ADVANCED .7 DNX12 (GAUZE/BANDAGES/DRESSINGS) ×1 IMPLANT
DRAPE C-ARM 42X72 X-RAY (DRAPES) ×2 IMPLANT
DRAPE LAPAROTOMY TRNSV 102X78 (DRAPE) ×2 IMPLANT
DRAPE UTILITY XL STRL (DRAPES) ×2 IMPLANT
DRSG TEGADERM 4X4.75 (GAUZE/BANDAGES/DRESSINGS) IMPLANT
ELECT REM PT RETURN 9FT ADLT (ELECTROSURGICAL) ×2
ELECTRODE REM PT RTRN 9FT ADLT (ELECTROSURGICAL) ×1 IMPLANT
GLOVE BIO SURGEON STRL SZ 6 (GLOVE) ×2 IMPLANT
GLOVE BIOGEL PI IND STRL 6.5 (GLOVE) ×1 IMPLANT
GLOVE BIOGEL PI IND STRL 7.0 (GLOVE) ×1 IMPLANT
GLOVE BIOGEL PI INDICATOR 6.5 (GLOVE) ×1
GLOVE BIOGEL PI INDICATOR 7.0 (GLOVE) ×1
GLOVE ECLIPSE 6.5 STRL STRAW (GLOVE) ×2 IMPLANT
GOWN PREVENTION PLUS XLARGE (GOWN DISPOSABLE) ×2 IMPLANT
GOWN PREVENTION PLUS XXLARGE (GOWN DISPOSABLE) ×2 IMPLANT
KIT PORT POWER 8FR ISP CVUE (Catheter) ×2 IMPLANT
NEEDLE HYPO 25X1 1.5 SAFETY (NEEDLE) ×2 IMPLANT
PACK BASIN DAY SURGERY FS (CUSTOM PROCEDURE TRAY) ×2 IMPLANT
PENCIL BUTTON HOLSTER BLD 10FT (ELECTRODE) ×2 IMPLANT
SHEATH COOK PEEL AWAY SET 9F (SHEATH) ×2 IMPLANT
SLEEVE SCD COMPRESS KNEE MED (MISCELLANEOUS) ×2 IMPLANT
SUT MNCRL AB 4-0 PS2 18 (SUTURE) ×2 IMPLANT
SUT PROLENE 2 0 SH DA (SUTURE) ×4 IMPLANT
SUT VIC AB 3-0 SH 27 (SUTURE) ×2
SUT VIC AB 3-0 SH 27X BRD (SUTURE) ×1 IMPLANT
SUT VICRYL 3-0 CR8 SH (SUTURE) IMPLANT
SYR 5ML LUER SLIP (SYRINGE) ×2 IMPLANT
SYR CONTROL 10ML LL (SYRINGE) ×2 IMPLANT
TOWEL OR 17X24 6PK STRL BLUE (TOWEL DISPOSABLE) ×2 IMPLANT
TOWEL OR NON WOVEN STRL DISP B (DISPOSABLE) ×2 IMPLANT

## 2013-03-18 NOTE — Transfer of Care (Signed)
Immediate Anesthesia Transfer of Care Note  Patient: Sheila Hurst  Procedure(s) Performed: Procedure(s) with comments: INSERTION PORT-A-CATH (N/A) - Flouro time , 11 seconds.  Patient Location: PACU  Anesthesia Type:General  Level of Consciousness: sedated and patient cooperative  Airway & Oxygen Therapy: Patient Spontanous Breathing and Patient connected to face mask oxygen  Post-op Assessment: Report given to PACU RN and Post -op Vital signs reviewed and stable  Post vital signs: Reviewed and stable  Complications: No apparent anesthesia complications

## 2013-03-18 NOTE — Op Note (Signed)
PREOPERATIVE DIAGNOSIS:  Pancreatic cancer     POSTOPERATIVE DIAGNOSIS:  Same     PROCEDURE: Right  subclavian port placement, Bard   Power Port, MRI safe, 8-French.      SURGEON:  Almond Lint, MD      ANESTHESIA:  General   FINDINGS:  Good venous return, easy flush, and tip of the catheter and   SVC 18 cm (cut at 28 cm mark on catheter, but length of catheter is 18 cm)     SPECIMEN:  None.      ESTIMATED BLOOD LOSS:  Minimal.      COMPLICATIONS:  None known.      PROCEDURE:  Pt was identified in the holding area and taken to   the operating room, where patient was placed supine on the operating room   table.  General anesthesia was induced.  Patient's arms were tucked and the upper   chest and neck were prepped and draped in sterile fashion.  Time-out was   performed according to the surgical safety check list.  When all was   correct, we continued.   Local anesthetic was administered over this   area at the angle of the clavicle.  The vein was accessed with 1 pass of the needle. There was good venous return and the wire passed easily with no ectopy.   Fluoroscopy was used to confirm that the wire was in the vena cava.      The patient was placed back level and the area for the pocket was anethetized   with local anesthetic.  A 3-cm transverse incision was made with a #15   blade.  Cautery was used to divide the subcutaneous tissues down to the   pectoralis muscle.  An [employer] retractor was used to elevate the skin   while a pocket was created on top of the pectoralis fascia.  The port   was placed into the pocket to confirm that it was of adequate size.  The   catheter was preattached to the port.  The port was then secured to the   pectoralis fascia with four 2-0 Prolene sutures.  These were clamped and   not tied down yet.    The catheter was tunneled through to the wire exit   site.  The catheter was placed along the wire to determine what length it should be to be in  the SVC.  The catheter was cut at 18 cm.  The tunneler sheath and dilator were passed over the wire and the dilator and wire were removed.  The catheter was attempted to be advanced through the tunneler sheath, however, the catheter would not pass beyond 10 cm.  The dilator sheath was pulled back and it still would not pass.  The wire was replaced through the tunneler, but the wire would not pass into the vena cava.  The entire apparatus was removed.  A new stick was performed in 1 pass.  The wire passed easily again.  A new tunneler sheath was used and the same thing happened.  The wire was reinserted and passed into the vena cava.  The other section of catheter was passed directly over the wire and went into the vena cava.  This was cut to the appropriate length and tunneled back to the port site.  The original piece of catheter attached to the port was removed.  The new section of catheter was attached to the port.  There was good venous  return and  easy flush of the catheter.  The Prolene sutures were tied   down to the pectoral fascia.  The skin was reapproximated using 3-0   Vicryl interrupted deep dermal sutures.    Fluoroscopy was used to re-confirm good position of the catheter.  The skin   was then closed using 4-0 Monocryl in a subcuticular fashion.  The port was flushed with concentrated heparin flush as well.  The wounds were then cleaned, dried, and dressed with Dermabond.  The patient was awakened from anesthesia and taken to the PACU in stable condition.  Needle, sponge, and instrument counts were correct.               Almond Lint, MD

## 2013-03-18 NOTE — Interval H&P Note (Signed)
History and Physical Interval Note:  03/18/2013 9:31 AM  Sheila Hurst  has presented today for surgery, with the diagnosis of pancreatic caner  The various methods of treatment have been discussed with the patient and family. After consideration of risks, benefits and other options for treatment, the patient has consented to  Procedure(s): INSERTION PORT-A-CATH (N/A) as a surgical intervention .  The patient's history has been reviewed, patient examined, no change in status, stable for surgery.  I have reviewed the patient's chart and labs.  Questions were answered to the patient's satisfaction.     Carolos Fecher

## 2013-03-18 NOTE — Anesthesia Preprocedure Evaluation (Signed)
Anesthesia Evaluation  Patient identified by MRN, date of birth, ID band Patient awake    Reviewed: Allergy & Precautions, H&P , NPO status , Patient's Chart, lab work & pertinent test results, reviewed documented beta blocker date and time   Airway Mallampati: II TM Distance: >3 FB Neck ROM: full    Dental   Pulmonary neg pulmonary ROS,  breath sounds clear to auscultation        Cardiovascular hypertension, On Medications Rhythm:regular     Neuro/Psych negative neurological ROS  negative psych ROS   GI/Hepatic Neg liver ROS, GERD-  Medicated and Controlled,  Endo/Other  negative endocrine ROS  Renal/GU negative Renal ROS  negative genitourinary   Musculoskeletal   Abdominal   Peds  Hematology negative hematology ROS (+)   Anesthesia Other Findings See surgeon's H&P   Reproductive/Obstetrics negative OB ROS                           Anesthesia Physical Anesthesia Plan  ASA: II  Anesthesia Plan: General   Post-op Pain Management:    Induction: Intravenous  Airway Management Planned: LMA  Additional Equipment:   Intra-op Plan:   Post-operative Plan:   Informed Consent: I have reviewed the patients History and Physical, chart, labs and discussed the procedure including the risks, benefits and alternatives for the proposed anesthesia with the patient or authorized representative who has indicated his/her understanding and acceptance.   Dental Advisory Given  Plan Discussed with: CRNA and Surgeon  Anesthesia Plan Comments:         Anesthesia Quick Evaluation

## 2013-03-18 NOTE — H&P (View-Only) (Signed)
HISTORY: Pt is doing well s/p whipple.  She does still have issues eating a lot.  She has lost 2 pounds, but this is over 5 weeks.  She was losing much more weight more rapidly.  Her energy level is starting to improve.      EXAM: General:  Alert and oriented Incision:  2 staples still there.  Removed.     PATHOLOGY: No residual cancer.     ASSESSMENT AND PLAN:   Pancreatic adenocarcinoma Weight is finally stabilizing.   Good energy level.  Has appt with Dr. Truett Perna to see if any adjuvant tx needed.        Maudry Diego, MD Surgical Oncology, General & Endocrine Surgery [facility]  Thora Lance, MD Manus Gunning Bryan Lemma, MD

## 2013-03-18 NOTE — Anesthesia Procedure Notes (Signed)
Procedure Name: LMA Insertion Date/Time: 03/18/2013 10:00 AM Performed by: Gar Gibbon Pre-anesthesia Checklist: Patient identified, Emergency Drugs available, Suction available and Patient being monitored Patient Re-evaluated:Patient Re-evaluated prior to inductionOxygen Delivery Method: Circle System Utilized Preoxygenation: Pre-oxygenation with 100% oxygen Intubation Type: IV induction Ventilation: Mask ventilation without difficulty LMA: LMA inserted LMA Size: 4.0 Number of attempts: 1 Airway Equipment and Method: bite block Placement Confirmation: positive ETCO2 Tube secured with: Tape Dental Injury: Teeth and Oropharynx as per pre-operative assessment

## 2013-03-18 NOTE — Anesthesia Postprocedure Evaluation (Signed)
Anesthesia Post Note  Patient: Sheila Hurst  Procedure(s) Performed: Procedure(s) (LRB): INSERTION PORT-A-CATH (N/A)  Anesthesia type: General  Patient location: PACU  Post pain: Pain level controlled  Post assessment: Patient's Cardiovascular Status Stable  Last Vitals:  Filed Vitals:   03/18/13 1200  BP: 167/72  Pulse: 65  Temp:   Resp: 21    Post vital signs: Reviewed and stable  Level of consciousness: alert  Complications: No apparent anesthesia complications

## 2013-03-19 ENCOUNTER — Encounter (HOSPITAL_BASED_OUTPATIENT_CLINIC_OR_DEPARTMENT_OTHER): Payer: Self-pay | Admitting: General Surgery

## 2013-03-21 ENCOUNTER — Other Ambulatory Visit: Payer: Self-pay | Admitting: Oncology

## 2013-03-25 ENCOUNTER — Other Ambulatory Visit: Payer: Self-pay | Admitting: Oncology

## 2013-03-25 ENCOUNTER — Other Ambulatory Visit (HOSPITAL_BASED_OUTPATIENT_CLINIC_OR_DEPARTMENT_OTHER): Payer: Medicare Other | Admitting: Lab

## 2013-03-25 ENCOUNTER — Ambulatory Visit: Payer: Medicare Other

## 2013-03-25 DIAGNOSIS — C259 Malignant neoplasm of pancreas, unspecified: Secondary | ICD-10-CM

## 2013-03-25 LAB — CBC WITH DIFFERENTIAL/PLATELET
BASO%: 0.7 % (ref 0.0–2.0)
Basophils Absolute: 0 10*3/uL (ref 0.0–0.1)
EOS%: 3.8 % (ref 0.0–7.0)
Eosinophils Absolute: 0.2 10*3/uL (ref 0.0–0.5)
HCT: 33.7 % — ABNORMAL LOW (ref 34.8–46.6)
HGB: 10.9 g/dL — ABNORMAL LOW (ref 11.6–15.9)
LYMPH%: 17.9 % (ref 14.0–49.7)
MCH: 28.3 pg (ref 25.1–34.0)
MCHC: 32.5 g/dL (ref 31.5–36.0)
MCV: 87.2 fL (ref 79.5–101.0)
MONO#: 0.4 10*3/uL (ref 0.1–0.9)
MONO%: 7.7 % (ref 0.0–14.0)
NEUT#: 3.6 10*3/uL (ref 1.5–6.5)
NEUT%: 69.9 % (ref 38.4–76.8)
Platelets: 252 10*3/uL (ref 145–400)
RBC: 3.86 10*6/uL (ref 3.70–5.45)
RDW: 14.3 % (ref 11.2–14.5)
WBC: 5.1 10*3/uL (ref 3.9–10.3)
lymph#: 0.9 10*3/uL (ref 0.9–3.3)

## 2013-03-25 LAB — COMPREHENSIVE METABOLIC PANEL (CC13)
Albumin: 3.2 g/dL — ABNORMAL LOW (ref 3.5–5.0)
Alkaline Phosphatase: 107 U/L (ref 40–150)
BUN: 12 mg/dL (ref 7.0–26.0)
CO2: 25 mEq/L (ref 22–29)
Calcium: 9.3 mg/dL (ref 8.4–10.4)
Chloride: 106 mEq/L (ref 98–109)
Glucose: 102 mg/dl (ref 70–140)
Potassium: 3.5 mEq/L (ref 3.5–5.1)

## 2013-03-25 MED ORDER — PROCHLORPERAZINE MALEATE 10 MG PO TABS: 10.0000 mg | ORAL_TABLET | Freq: Once | ORAL | Status: DC

## 2013-03-25 NOTE — Progress Notes (Signed)
Pt and daughter requested to defer treatment by one week.  Dr. Truett Perna notified.

## 2013-03-26 LAB — CANCER ANTIGEN 19-9: CA 19-9: 21 U/mL (ref <35.0)

## 2013-03-29 ENCOUNTER — Telehealth: Payer: Self-pay | Admitting: *Deleted

## 2013-03-29 ENCOUNTER — Telehealth: Payer: Self-pay | Admitting: Oncology

## 2013-03-29 ENCOUNTER — Other Ambulatory Visit: Payer: Self-pay | Admitting: *Deleted

## 2013-03-29 DIAGNOSIS — C259 Malignant neoplasm of pancreas, unspecified: Secondary | ICD-10-CM

## 2013-03-29 NOTE — Telephone Encounter (Signed)
Talked to pt and she is aware of appt on July lab and chemo and also advised pt to get calendar for August 2014

## 2013-03-29 NOTE — Telephone Encounter (Signed)
Per staff message and POF I have scheduled appts.  JMW  

## 2013-03-29 NOTE — Progress Notes (Signed)
POF to scheduler to make changes in schedule since week #1 cycle #1 did not begin until 7/31. 8/14= c1w3 (will need OV)

## 2013-03-30 ENCOUNTER — Encounter (INDEPENDENT_AMBULATORY_CARE_PROVIDER_SITE_OTHER): Payer: Medicare Other | Admitting: General Surgery

## 2013-04-01 ENCOUNTER — Other Ambulatory Visit (HOSPITAL_BASED_OUTPATIENT_CLINIC_OR_DEPARTMENT_OTHER): Payer: Medicare Other | Admitting: Lab

## 2013-04-01 ENCOUNTER — Ambulatory Visit (HOSPITAL_BASED_OUTPATIENT_CLINIC_OR_DEPARTMENT_OTHER): Payer: Medicare Other

## 2013-04-01 VITALS — BP 174/70 | HR 99 | Temp 96.9°F | Resp 20

## 2013-04-01 DIAGNOSIS — C259 Malignant neoplasm of pancreas, unspecified: Secondary | ICD-10-CM

## 2013-04-01 DIAGNOSIS — Z5111 Encounter for antineoplastic chemotherapy: Secondary | ICD-10-CM

## 2013-04-01 DIAGNOSIS — C250 Malignant neoplasm of head of pancreas: Secondary | ICD-10-CM

## 2013-04-01 LAB — CBC WITH DIFFERENTIAL/PLATELET
Eosinophils Absolute: 0.1 10*3/uL (ref 0.0–0.5)
HCT: 35.5 % (ref 34.8–46.6)
LYMPH%: 18.4 % (ref 14.0–49.7)
MONO#: 0.4 10*3/uL (ref 0.1–0.9)
NEUT#: 3.3 10*3/uL (ref 1.5–6.5)
NEUT%: 70.3 % (ref 38.4–76.8)
Platelets: 228 10*3/uL (ref 145–400)
RBC: 4.07 10*6/uL (ref 3.70–5.45)
WBC: 4.8 10*3/uL (ref 3.9–10.3)
lymph#: 0.9 10*3/uL (ref 0.9–3.3)

## 2013-04-01 MED ORDER — HEPARIN SOD (PORK) LOCK FLUSH 100 UNIT/ML IV SOLN
500.0000 [IU] | Freq: Once | INTRAVENOUS | Status: DC | PRN
  Filled 2013-04-01: qty 5

## 2013-04-01 MED ORDER — SODIUM CHLORIDE 0.9 % IV SOLN
1000.0000 mg/m2 | Freq: Once | INTRAVENOUS | Status: AC
  Administered 2013-04-01: 1710 mg via INTRAVENOUS
  Filled 2013-04-01: qty 45

## 2013-04-01 MED ORDER — PROCHLORPERAZINE MALEATE 10 MG PO TABS
10.0000 mg | ORAL_TABLET | Freq: Once | ORAL | Status: AC
  Administered 2013-04-01: 10 mg via ORAL

## 2013-04-01 MED ORDER — SODIUM CHLORIDE 0.9 % IJ SOLN
10.0000 mL | INTRAMUSCULAR | Status: DC | PRN
  Filled 2013-04-01: qty 10

## 2013-04-01 MED ORDER — LIDOCAINE-PRILOCAINE 2.5-2.5 % EX CREA: TOPICAL_CREAM | CUTANEOUS | Status: DC | PRN

## 2013-04-01 MED ORDER — SODIUM CHLORIDE 0.9 % IV SOLN
Freq: Once | INTRAVENOUS | Status: AC
  Administered 2013-04-01: 11:00:00 via INTRAVENOUS

## 2013-04-02 ENCOUNTER — Other Ambulatory Visit (INDEPENDENT_AMBULATORY_CARE_PROVIDER_SITE_OTHER): Payer: Self-pay

## 2013-04-02 ENCOUNTER — Ambulatory Visit (INDEPENDENT_AMBULATORY_CARE_PROVIDER_SITE_OTHER): Payer: Medicare Other | Admitting: General Surgery

## 2013-04-02 ENCOUNTER — Encounter (INDEPENDENT_AMBULATORY_CARE_PROVIDER_SITE_OTHER): Payer: Self-pay | Admitting: General Surgery

## 2013-04-02 ENCOUNTER — Telehealth (INDEPENDENT_AMBULATORY_CARE_PROVIDER_SITE_OTHER): Payer: Self-pay

## 2013-04-02 VITALS — BP 132/74 | HR 74 | Temp 98.0°F | Resp 18 | Ht 64.0 in | Wt 145.0 lb

## 2013-04-02 DIAGNOSIS — C259 Malignant neoplasm of pancreas, unspecified: Secondary | ICD-10-CM

## 2013-04-02 MED ORDER — PANTOPRAZOLE SODIUM 40 MG PO TBEC: 40.0000 mg | DELAYED_RELEASE_TABLET | Freq: Every day | ORAL | Status: DC

## 2013-04-02 NOTE — Telephone Encounter (Signed)
Protonix 40 mg tablet #30 w/ 2 additional refills called to [facility].  Pt is aware.

## 2013-04-03 NOTE — Progress Notes (Signed)
HISTORY: Pt is s/p whipple.  She received port a cath around 2 weeks ago.  She has not had any issues and received chemotherapy via port in the last few days.  She is eating well.      EXAM: General:  Alert and oriented.   Incision:  Healing well.     ASSESSMENT AND PLAN:   Pancreatic adenocarcinoma No issues s/p port removal.  Follow up in 3 months.      Maudry Diego, MD Surgical Oncology, General & Endocrine Surgery [facility]  Thora Lance, MD Manus Gunning Bryan Lemma, MD

## 2013-04-03 NOTE — Assessment & Plan Note (Signed)
No issues s/p port removal.  Follow up in 3 months.

## 2013-04-07 ENCOUNTER — Other Ambulatory Visit: Payer: Self-pay | Admitting: Oncology

## 2013-04-08 ENCOUNTER — Telehealth: Payer: Self-pay | Admitting: Oncology

## 2013-04-08 ENCOUNTER — Ambulatory Visit: Payer: Medicare Other

## 2013-04-08 ENCOUNTER — Encounter: Payer: Self-pay | Admitting: Radiation Oncology

## 2013-04-08 ENCOUNTER — Ambulatory Visit (HOSPITAL_BASED_OUTPATIENT_CLINIC_OR_DEPARTMENT_OTHER): Payer: Medicare Other | Admitting: Nurse Practitioner

## 2013-04-08 ENCOUNTER — Other Ambulatory Visit (HOSPITAL_BASED_OUTPATIENT_CLINIC_OR_DEPARTMENT_OTHER): Payer: Medicare Other | Admitting: Lab

## 2013-04-08 VITALS — BP 134/72 | HR 51 | Temp 97.4°F | Resp 18 | Ht 64.0 in | Wt 145.1 lb

## 2013-04-08 DIAGNOSIS — C259 Malignant neoplasm of pancreas, unspecified: Secondary | ICD-10-CM

## 2013-04-08 DIAGNOSIS — C250 Malignant neoplasm of head of pancreas: Secondary | ICD-10-CM

## 2013-04-08 DIAGNOSIS — D702 Other drug-induced agranulocytosis: Secondary | ICD-10-CM

## 2013-04-08 DIAGNOSIS — T451X5A Adverse effect of antineoplastic and immunosuppressive drugs, initial encounter: Secondary | ICD-10-CM

## 2013-04-08 DIAGNOSIS — Z86718 Personal history of other venous thrombosis and embolism: Secondary | ICD-10-CM

## 2013-04-08 LAB — CBC WITH DIFFERENTIAL/PLATELET
BASO%: 0.5 % (ref 0.0–2.0)
Basophils Absolute: 0 10*3/uL (ref 0.0–0.1)
HCT: 31.6 % — ABNORMAL LOW (ref 34.8–46.6)
HGB: 10.3 g/dL — ABNORMAL LOW (ref 11.6–15.9)
LYMPH%: 39.9 % (ref 14.0–49.7)
MCHC: 32.6 g/dL (ref 31.5–36.0)
MONO#: 0.2 10*3/uL (ref 0.1–0.9)
NEUT%: 48.9 % (ref 38.4–76.8)
Platelets: 158 10*3/uL (ref 145–400)
WBC: 1.9 10*3/uL — ABNORMAL LOW (ref 3.9–10.3)
lymph#: 0.8 10*3/uL — ABNORMAL LOW (ref 0.9–3.3)

## 2013-04-08 NOTE — Progress Notes (Signed)
OFFICE PROGRESS NOTE  Interval history:  Sheila Hurst returns as scheduled. She completed cycle 1 week 1 gemcitabine on 04/01/2013. She denies nausea/vomiting. No mouth sores. No skin rash. No fever. She denies diarrhea. She has 3-4 soft bowel movements per day.   Objective: Blood pressure 134/72, pulse 51, temperature 97.4 F (36.3 C), temperature source Oral, resp. rate 18, height 5\' 4"  (1.626 m), weight 145 lb 1.6 oz (65.817 kg), SpO2 100.00%.  Oropharynx is without thrush or ulceration. Lungs are clear. Regular cardiac rhythm. Port-A-Cath site is without erythema. Abdomen is soft and nontender. No hepatomegaly. Extremities are without edema.  Lab Results: Lab Results  Component Value Date   WBC 1.9* 04/08/2013   HGB 10.3* 04/08/2013   HCT 31.6* 04/08/2013   MCV 84.5 04/08/2013   PLT 158 04/08/2013    Chemistry:    Chemistry      Component Value Date/Time   NA 140 03/25/2013 1012   NA 142 03/18/2013 0852   K 3.5 03/25/2013 1012   K 3.0* 03/18/2013 0852   CL 106 03/18/2013 0852   CL 104 10/05/2012 1234   CO2 25 03/25/2013 1012   CO2 29 02/09/2013 0509   BUN 12.0 03/25/2013 1012   BUN 8 03/18/2013 0852   CREATININE 0.9 03/25/2013 1012   CREATININE 0.80 03/18/2013 0852      Component Value Date/Time   CALCIUM 9.3 03/25/2013 1012   CALCIUM 9.1 02/09/2013 0509   ALKPHOS 107 03/25/2013 1012   ALKPHOS 193* 02/09/2013 0509   AST 20 03/25/2013 1012   AST 15 02/09/2013 0509   ALT 20 03/25/2013 1012   ALT 14 02/09/2013 0509   BILITOT 0.58 03/25/2013 1012   BILITOT 0.3 02/09/2013 0509       Studies/Results: Dg Chest Port 1 View  03/18/2013   *RADIOLOGY REPORT*  Clinical Data: Porta catheter placement  PORTABLE CHEST - 1 VIEW  Comparison: 11/24/2012  Findings: There is a right chest wall porta-catheter with tip in the projection the SVC.  The heart size appears normal.  Atelectasis is noted in the left base.  No pleural effusion or edema noted.  There is no airspace consolidation.  IMPRESSION:  1.  No  pneumothorax after placement of the right chest wall porta- catheter. Tip of the catheter is in the SVC.   Original Report Authenticated By: Signa Kell, M.D.   Dg Fluoro Guide Cv Line-no Report  03/18/2013   CLINICAL DATA: PORTACATH   FLOURO GUIDE CV LINE  Fluoroscopy was utilized by the requesting physician.  No radiographic  interpretation.     Medications: I have reviewed the patient's current medications.  Assessment/Plan:  1. Adenocarcinoma of the pancreas; mass in the neck of the pancreas, status post EUS guided biopsy 09/15/2012 with the cytology confirming malignant cells consistent with adenocarcinoma, T3 N0 by ultrasound. 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 Elevated CA 19-9. She began radiation and concurrent Xeloda chemotherapy on 10/19/2012, radiation completed 11/30/2012 -Whipple procedure 01/28/2013 with no residual carcinoma seen and stromal fibrosis consistent with post treatment effect.  -She began adjuvant gemcitabine 04/01/2013. 2. Pain secondary to pancreas cancer. Improved. 3. Right liver cyst. Smaller on MRI 09/28/2012 compared to 11/03/2008. 4. Hypertension currently taking Cardizem and Maxide. 5. Hyperlipidemia. 6. Family history of pancreas cancer (paternal aunt). 7. Anorexia/weight loss. Weight is stable. 8. Bradycardia when here on 11/11/2012. Atenolol was placed on hold. 9. Abdominal pain with CT scan 11/24/2012 showing  decrease in the size of the pancreatic head mass and fairly marked antral wall thickening of the stomach with surrounding inflammatory change. She is taking Dilaudid. Due to the skin rash and "woozy" sensation after taking Dilaudid the Dilaudid was discontinued.Marland Kitchen She was given a new prescription for oxycodone 5 mg 1-2 tablets every 4 hours as needed and she was started on Protonix 40 mg daily. 10. Right leg DVT documented on a Doppler 11/26/2012-now on Lovenox. 11. Skin rash  of unclear etiology. Question medication related. She was instructed to discontinue Dilaudid and also to discontinue Xeloda. The rash resolved. 12. Neutropenia secondary to chemotherapy.  Disposition-[name] has completed one treatment with gemcitabine. She is neutropenic on labs today. We will hold today's treatment and reschedule for one week. The gemcitabine will be dose reduced beginning with the next treatment. She will return for a followup visit on 04/29/2013. She will contact the office in the interim with any problems.  Plan reviewed with Dr. Truett Perna.  Sheila Hurst ANP/GNP-BC

## 2013-04-08 NOTE — Progress Notes (Signed)
Met with patient and her daughter today.  I explained that she will be unable to reapply for FA when her discount expires, patient has applied for Medicaid.  Daughter states that the case worker told her that patient will have a deductible.  Patient stated she will bring me the bills that she has and the social worker's information for follow up.

## 2013-04-08 NOTE — Telephone Encounter (Signed)
gv and printed appt sched and avs for pt...s/w and emailed MB to add tx.Marland KitchenMarland Kitchen

## 2013-04-11 ENCOUNTER — Other Ambulatory Visit: Payer: Self-pay | Admitting: Oncology

## 2013-04-13 ENCOUNTER — Encounter: Payer: Self-pay | Admitting: Radiation Oncology

## 2013-04-13 NOTE — Progress Notes (Signed)
Left VM for patient's caseworker, Ms. Whitney Post, for a return call on getting bills to send for patient's deductible for Medicaid.

## 2013-04-15 ENCOUNTER — Ambulatory Visit: Payer: Medicare Other | Admitting: Nurse Practitioner

## 2013-04-15 ENCOUNTER — Other Ambulatory Visit: Payer: Medicare Other | Admitting: Lab

## 2013-04-15 ENCOUNTER — Ambulatory Visit: Payer: Medicare Other | Admitting: Nutrition

## 2013-04-15 ENCOUNTER — Ambulatory Visit (HOSPITAL_BASED_OUTPATIENT_CLINIC_OR_DEPARTMENT_OTHER): Payer: Medicare Other

## 2013-04-15 ENCOUNTER — Other Ambulatory Visit (HOSPITAL_BASED_OUTPATIENT_CLINIC_OR_DEPARTMENT_OTHER): Payer: Medicare Other | Admitting: Lab

## 2013-04-15 VITALS — BP 138/93 | HR 62 | Temp 98.2°F | Resp 20

## 2013-04-15 DIAGNOSIS — C250 Malignant neoplasm of head of pancreas: Secondary | ICD-10-CM

## 2013-04-15 DIAGNOSIS — C259 Malignant neoplasm of pancreas, unspecified: Secondary | ICD-10-CM

## 2013-04-15 DIAGNOSIS — Z5111 Encounter for antineoplastic chemotherapy: Secondary | ICD-10-CM

## 2013-04-15 LAB — CBC WITH DIFFERENTIAL/PLATELET
Basophils Absolute: 0 10*3/uL (ref 0.0–0.1)
EOS%: 3.8 % (ref 0.0–7.0)
Eosinophils Absolute: 0.1 10*3/uL (ref 0.0–0.5)
HCT: 33.2 % — ABNORMAL LOW (ref 34.8–46.6)
HGB: 11 g/dL — ABNORMAL LOW (ref 11.6–15.9)
LYMPH%: 17.9 % (ref 14.0–49.7)
MCH: 28.8 pg (ref 25.1–34.0)
MCV: 86.9 fL (ref 79.5–101.0)
MONO%: 11.4 % (ref 0.0–14.0)
NEUT#: 2.5 10*3/uL (ref 1.5–6.5)
NEUT%: 66.2 % (ref 38.4–76.8)
Platelets: 334 10*3/uL (ref 145–400)
RDW: 16 % — ABNORMAL HIGH (ref 11.2–14.5)

## 2013-04-15 MED ORDER — HEPARIN SOD (PORK) LOCK FLUSH 100 UNIT/ML IV SOLN
500.0000 [IU] | Freq: Once | INTRAVENOUS | Status: AC | PRN
Start: 2013-04-15 — End: 2013-04-15
  Administered 2013-04-15: 500 [IU]
  Filled 2013-04-15: qty 5

## 2013-04-15 MED ORDER — SODIUM CHLORIDE 0.9 % IJ SOLN
10.0000 mL | INTRAMUSCULAR | Status: DC | PRN
  Administered 2013-04-15: 10 mL
  Filled 2013-04-15: qty 10

## 2013-04-15 MED ORDER — SODIUM CHLORIDE 0.9 % IV SOLN
Freq: Once | INTRAVENOUS | Status: AC
  Administered 2013-04-15: 13:00:00 via INTRAVENOUS

## 2013-04-15 MED ORDER — PROCHLORPERAZINE MALEATE 10 MG PO TABS
10.0000 mg | ORAL_TABLET | Freq: Once | ORAL | Status: AC
  Administered 2013-04-15: 10 mg via ORAL

## 2013-04-15 MED ORDER — SODIUM CHLORIDE 0.9 % IV SOLN
750.0000 mg/m2 | Freq: Once | INTRAVENOUS | Status: AC
  Administered 2013-04-15: 1292 mg via INTRAVENOUS
  Filled 2013-04-15: qty 33.98

## 2013-04-15 NOTE — Progress Notes (Signed)
Nutrition followup completed with patient in chemotherapy.  Patient reports she feels well.  Her weight was documented as 145.1 pounds August 7 has been stable over the past 6 weeks.  She is eating well.  She has no nutrition concerns.  Nutrition diagnosis: Food and nutrition related knowledge deficit resolved.  I encouraged patient to continue strategies for increased oral intake and to contact me if she developed any problems in the future.

## 2013-04-20 ENCOUNTER — Other Ambulatory Visit: Payer: Self-pay | Admitting: Oncology

## 2013-04-22 ENCOUNTER — Telehealth: Payer: Self-pay | Admitting: *Deleted

## 2013-04-22 ENCOUNTER — Other Ambulatory Visit (HOSPITAL_BASED_OUTPATIENT_CLINIC_OR_DEPARTMENT_OTHER): Payer: Medicare Other | Admitting: Lab

## 2013-04-22 ENCOUNTER — Ambulatory Visit: Payer: Medicare Other

## 2013-04-22 ENCOUNTER — Other Ambulatory Visit: Payer: Self-pay | Admitting: Oncology

## 2013-04-22 DIAGNOSIS — C259 Malignant neoplasm of pancreas, unspecified: Secondary | ICD-10-CM

## 2013-04-22 LAB — COMPREHENSIVE METABOLIC PANEL (CC13)
AST: 23 U/L (ref 5–34)
Albumin: 3.1 g/dL — ABNORMAL LOW (ref 3.5–5.0)
Alkaline Phosphatase: 113 U/L (ref 40–150)
Glucose: 140 mg/dl (ref 70–140)
Potassium: 3.4 mEq/L — ABNORMAL LOW (ref 3.5–5.1)
Sodium: 142 mEq/L (ref 136–145)
Total Bilirubin: 0.34 mg/dL (ref 0.20–1.20)
Total Protein: 6.8 g/dL (ref 6.4–8.3)

## 2013-04-22 LAB — CBC WITH DIFFERENTIAL/PLATELET
EOS%: 1.2 % (ref 0.0–7.0)
Eosinophils Absolute: 0 10*3/uL (ref 0.0–0.5)
LYMPH%: 35 % (ref 14.0–49.7)
MCH: 28.5 pg (ref 25.1–34.0)
MCHC: 33 g/dL (ref 31.5–36.0)
MCV: 86.2 fL (ref 79.5–101.0)
MONO%: 15.3 % — ABNORMAL HIGH (ref 0.0–14.0)
NEUT#: 0.9 10*3/uL — ABNORMAL LOW (ref 1.5–6.5)
Platelets: 341 10*3/uL (ref 145–400)
RBC: 3.69 10*6/uL — ABNORMAL LOW (ref 3.70–5.45)
RDW: 16 % — ABNORMAL HIGH (ref 11.2–14.5)

## 2013-04-22 NOTE — Progress Notes (Signed)
Per Dr. Truett Perna, hold today's treatment due to labs. Patient verbalized understanding.

## 2013-04-22 NOTE — Telephone Encounter (Signed)
Treatment room RN reported pt is having abdominal pain and wants to see MD today. Spoke with pt in lobby, she reports abdominal "soreness" near incision site onset 2 days ago. Has not taken analgesic for this.Pt denies any fever or change in appearance of incision site. States she has been more active and thinks it is muscle soreness. Instructed her to call this  office or surgeon's office if this pain worsens. She voiced understanding. Instructed pt to keep follow up appt in one week.

## 2013-04-25 ENCOUNTER — Other Ambulatory Visit: Payer: Self-pay | Admitting: Oncology

## 2013-04-29 ENCOUNTER — Other Ambulatory Visit (HOSPITAL_BASED_OUTPATIENT_CLINIC_OR_DEPARTMENT_OTHER): Payer: Medicare Other | Admitting: Lab

## 2013-04-29 ENCOUNTER — Ambulatory Visit (HOSPITAL_BASED_OUTPATIENT_CLINIC_OR_DEPARTMENT_OTHER): Payer: Medicare Other | Admitting: Oncology

## 2013-04-29 ENCOUNTER — Telehealth: Payer: Self-pay | Admitting: *Deleted

## 2013-04-29 ENCOUNTER — Ambulatory Visit (HOSPITAL_BASED_OUTPATIENT_CLINIC_OR_DEPARTMENT_OTHER): Payer: Medicare Other

## 2013-04-29 ENCOUNTER — Telehealth: Payer: Self-pay

## 2013-04-29 VITALS — BP 129/76 | HR 64 | Temp 97.0°F | Resp 19 | Ht 64.0 in | Wt 144.1 lb

## 2013-04-29 DIAGNOSIS — C250 Malignant neoplasm of head of pancreas: Secondary | ICD-10-CM

## 2013-04-29 DIAGNOSIS — I10 Essential (primary) hypertension: Secondary | ICD-10-CM

## 2013-04-29 DIAGNOSIS — C259 Malignant neoplasm of pancreas, unspecified: Secondary | ICD-10-CM

## 2013-04-29 DIAGNOSIS — Z5111 Encounter for antineoplastic chemotherapy: Secondary | ICD-10-CM

## 2013-04-29 DIAGNOSIS — D709 Neutropenia, unspecified: Secondary | ICD-10-CM

## 2013-04-29 DIAGNOSIS — Z7901 Long term (current) use of anticoagulants: Secondary | ICD-10-CM

## 2013-04-29 DIAGNOSIS — Z86718 Personal history of other venous thrombosis and embolism: Secondary | ICD-10-CM

## 2013-04-29 LAB — CBC WITH DIFFERENTIAL/PLATELET
Basophils Absolute: 0 10*3/uL (ref 0.0–0.1)
EOS%: 1.7 % (ref 0.0–7.0)
Eosinophils Absolute: 0.1 10*3/uL (ref 0.0–0.5)
HGB: 10.9 g/dL — ABNORMAL LOW (ref 11.6–15.9)
LYMPH%: 22.2 % (ref 14.0–49.7)
MCH: 27.7 pg (ref 25.1–34.0)
MCV: 85.8 fL (ref 79.5–101.0)
MONO%: 7.3 % (ref 0.0–14.0)
NEUT#: 2.8 10*3/uL (ref 1.5–6.5)
Platelets: 196 10*3/uL (ref 145–400)

## 2013-04-29 MED ORDER — PROCHLORPERAZINE MALEATE 10 MG PO TABS
10.0000 mg | ORAL_TABLET | Freq: Once | ORAL | Status: AC
  Administered 2013-04-29: 10 mg via ORAL

## 2013-04-29 MED ORDER — SODIUM CHLORIDE 0.9 % IV SOLN
750.0000 mg/m2 | Freq: Once | INTRAVENOUS | Status: AC
  Administered 2013-04-29: 1292 mg via INTRAVENOUS
  Filled 2013-04-29: qty 33.98

## 2013-04-29 MED ORDER — SODIUM CHLORIDE 0.9 % IJ SOLN
10.0000 mL | INTRAMUSCULAR | Status: DC | PRN
  Administered 2013-04-29: 10 mL
  Filled 2013-04-29: qty 10

## 2013-04-29 MED ORDER — SODIUM CHLORIDE 0.9 % IV SOLN
Freq: Once | INTRAVENOUS | Status: AC
  Administered 2013-04-29: 13:00:00 via INTRAVENOUS

## 2013-04-29 MED ORDER — HEPARIN SOD (PORK) LOCK FLUSH 100 UNIT/ML IV SOLN
500.0000 [IU] | Freq: Once | INTRAVENOUS | Status: AC | PRN
  Administered 2013-04-29: 500 [IU]
  Filled 2013-04-29: qty 5

## 2013-04-29 NOTE — Progress Notes (Signed)
   [name]    OFFICE PROGRESS NOTE   INTERVAL HISTORY:   She returns as scheduled. She has completed 2 treatments with gemcitabine. Treatment has been delayed secondary to neutropenia. No fever or nausea. She reports soreness at the Lovenox injection sites. Her chief complaint is malaise. No dyspnea.  Objective:  Vital signs in last 24 hours:  Blood pressure 129/76, pulse 64, temperature 97 F (36.1 C), temperature source Oral, resp. rate 19, height 5\' 4"  (1.626 m), weight 144 lb 1.6 oz (65.363 kg).    HEENT: No thrush or ulcers Resp: Lungs clear bilaterally Cardio: Regular rate and rhythm GI: No hepatomegaly, mild tenderness at the sites of nodular induration where she has injected Vascular: No leg edema  Skin: Small ecchymoses at Lovenox injection sites over the abdominal wall   Portacath/PICC-without erythema  Lab Results:  Lab Results  Component Value Date   WBC 4.1 04/29/2013   HGB 10.9* 04/29/2013   HCT 33.8* 04/29/2013   MCV 85.8 04/29/2013   PLT 196 04/29/2013   ANC 2.8    Medications: I have reviewed the patient's current medications.  Assessment/Plan: 1. Adenocarcinoma of the pancreas; mass in the neck of the pancreas, status post EUS guided biopsy 09/15/2012 with the cytology confirming malignant cells consistent with adenocarcinoma, T3 N0 by ultrasound. 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 Elevated CA 19-9. She began radiation and concurrent Xeloda chemotherapy on 10/19/2012, radiation completed 11/30/2012 -Whipple procedure 01/28/2013 with no residual carcinoma seen and stromal fibrosis consistent with post treatment effect.  -She began adjuvant gemcitabine 04/01/2013.  2. Pain secondary to pancreas cancer. Improved. 3. Right liver cyst. Smaller on MRI 09/28/2012 compared to 11/03/2008. 4. Hypertension currently taking Cardizem and Maxide. 5. Hyperlipidemia. 6. Family  history of pancreas cancer (paternal aunt). 7. Anorexia/weight loss. Weight is stable. No longer taking Megace 8. Bradycardia when here on 11/11/2012. Atenolol was placed on hold. 9. Abdominal pain with CT scan 11/24/2012 showing decrease in the size of the pancreatic head mass and fairly marked antral wall thickening of the stomach with surrounding inflammatory change. She is taking Dilaudid. Due to the skin rash and "woozy" sensation after taking Dilaudid the Dilaudid was discontinued.Marland Kitchen She was given a new prescription for oxycodone 5 mg 1-2 tablets every 4 hours as needed and she was started on Protonix 40 mg daily. 10. Right leg DVT documented on a Doppler 11/26/2012-now on Lovenox. 11. Skin rash of unclear etiology. Question medication related. She was instructed to discontinue Dilaudid and also to discontinue Xeloda. The rash resolved. 12. Neutropenia secondary to chemotherapy-resolved   Disposition:  She appears to be tolerating the gemcitabine well aside from neutropenia. We decided to continue gemcitabine at the current dose on an every 2 week schedule. She will return for gemcitabine in 2 weeks and she will be scheduled for an office visit in 4 weeks. The malaise is most likely related to the recent Whipple procedure and chemotherapy.   Thornton Papas, MD  04/29/2013  12:33 PM

## 2013-04-29 NOTE — Telephone Encounter (Signed)
Per staff message and POF I have scheduled appts.  JMW  

## 2013-04-29 NOTE — Telephone Encounter (Signed)
gv adn printed appt sched and avs for pt...emailed MW to add tx.   °

## 2013-04-29 NOTE — Patient Instructions (Addendum)
[facility] Discharge Instructions for Patients Receiving Chemotherapy  Today you received the following chemotherapy agents: Gemzar.  To help prevent nausea and vomiting after your treatment, we encourage you to take your nausea medication, Compazine. Take one every six hours as needed.   If you develop nausea and vomiting that is not controlled by your nausea medication, call the clinic.   BELOW ARE SYMPTOMS THAT SHOULD BE REPORTED IMMEDIATELY:  *FEVER GREATER THAN 100.5 F  *CHILLS WITH OR WITHOUT FEVER  NAUSEA AND VOMITING THAT IS NOT CONTROLLED WITH YOUR NAUSEA MEDICATION  *UNUSUAL SHORTNESS OF BREATH  *UNUSUAL BRUISING OR BLEEDING  TENDERNESS IN MOUTH AND THROAT WITH OR WITHOUT PRESENCE OF ULCERS  *URINARY PROBLEMS  *BOWEL PROBLEMS  UNUSUAL RASH Items with * indicate a potential emergency and should be followed up as soon as possible.  Feel free to call the clinic should you have any questions or concerns. The clinic phone number is [phone].    

## 2013-04-30 ENCOUNTER — Other Ambulatory Visit: Payer: Self-pay | Admitting: Certified Registered Nurse Anesthetist

## 2013-05-06 ENCOUNTER — Other Ambulatory Visit (HOSPITAL_BASED_OUTPATIENT_CLINIC_OR_DEPARTMENT_OTHER): Payer: Medicare Other | Admitting: Lab

## 2013-05-06 DIAGNOSIS — C259 Malignant neoplasm of pancreas, unspecified: Secondary | ICD-10-CM

## 2013-05-06 LAB — CBC WITH DIFFERENTIAL/PLATELET
EOS%: 1.4 % (ref 0.0–7.0)
Eosinophils Absolute: 0 10*3/uL (ref 0.0–0.5)
MCH: 29 pg (ref 25.1–34.0)
MCV: 86.6 fL (ref 79.5–101.0)
MONO%: 16.1 % — ABNORMAL HIGH (ref 0.0–14.0)
NEUT#: 0.8 10*3/uL — ABNORMAL LOW (ref 1.5–6.5)
RBC: 3.41 10*6/uL — ABNORMAL LOW (ref 3.70–5.45)
RDW: 16.5 % — ABNORMAL HIGH (ref 11.2–14.5)

## 2013-05-09 ENCOUNTER — Other Ambulatory Visit: Payer: Self-pay | Admitting: Oncology

## 2013-05-13 ENCOUNTER — Other Ambulatory Visit (HOSPITAL_BASED_OUTPATIENT_CLINIC_OR_DEPARTMENT_OTHER): Payer: Medicare Other | Admitting: Lab

## 2013-05-13 ENCOUNTER — Ambulatory Visit (HOSPITAL_BASED_OUTPATIENT_CLINIC_OR_DEPARTMENT_OTHER): Payer: Medicare Other

## 2013-05-13 VITALS — BP 152/72 | HR 63 | Temp 97.1°F | Resp 18

## 2013-05-13 DIAGNOSIS — C250 Malignant neoplasm of head of pancreas: Secondary | ICD-10-CM

## 2013-05-13 DIAGNOSIS — C259 Malignant neoplasm of pancreas, unspecified: Secondary | ICD-10-CM

## 2013-05-13 DIAGNOSIS — Z5111 Encounter for antineoplastic chemotherapy: Secondary | ICD-10-CM

## 2013-05-13 LAB — BASIC METABOLIC PANEL (CC13)
Potassium: 3.3 mEq/L — ABNORMAL LOW (ref 3.5–5.1)
Sodium: 141 mEq/L (ref 136–145)

## 2013-05-13 LAB — CBC WITH DIFFERENTIAL/PLATELET
Eosinophils Absolute: 0.2 10*3/uL (ref 0.0–0.5)
HCT: 33.3 % — ABNORMAL LOW (ref 34.8–46.6)
HGB: 11 g/dL — ABNORMAL LOW (ref 11.6–15.9)
LYMPH%: 9.8 % — ABNORMAL LOW (ref 14.0–49.7)
MONO#: 0.6 10*3/uL (ref 0.1–0.9)
NEUT#: 3.1 10*3/uL (ref 1.5–6.5)
NEUT%: 70.9 % (ref 38.4–76.8)
Platelets: 218 10*3/uL (ref 145–400)
WBC: 4.4 10*3/uL (ref 3.9–10.3)

## 2013-05-13 MED ORDER — SODIUM CHLORIDE 0.9 % IJ SOLN
10.0000 mL | INTRAMUSCULAR | Status: DC | PRN
  Administered 2013-05-13: 10 mL
  Filled 2013-05-13: qty 10

## 2013-05-13 MED ORDER — SODIUM CHLORIDE 0.9 % IV SOLN
Freq: Once | INTRAVENOUS | Status: AC
  Administered 2013-05-13: 12:00:00 via INTRAVENOUS

## 2013-05-13 MED ORDER — PROCHLORPERAZINE MALEATE 10 MG PO TABS
10.0000 mg | ORAL_TABLET | Freq: Once | ORAL | Status: AC
  Administered 2013-05-13: 10 mg via ORAL

## 2013-05-13 MED ORDER — SODIUM CHLORIDE 0.9 % IV SOLN
750.0000 mg/m2 | Freq: Once | INTRAVENOUS | Status: AC
  Administered 2013-05-13: 1292 mg via INTRAVENOUS
  Filled 2013-05-13: qty 33.98

## 2013-05-13 MED ORDER — PROCHLORPERAZINE MALEATE 10 MG PO TABS
ORAL_TABLET | ORAL | Status: AC
  Administered 2013-05-13: 10 mg via ORAL
  Filled 2013-05-13: qty 1

## 2013-05-13 MED ORDER — HEPARIN SOD (PORK) LOCK FLUSH 100 UNIT/ML IV SOLN
500.0000 [IU] | Freq: Once | INTRAVENOUS | Status: AC | PRN
  Administered 2013-05-13: 500 [IU]
  Filled 2013-05-13: qty 5

## 2013-05-13 NOTE — Patient Instructions (Addendum)
[facility] Discharge Instructions for Patients Receiving Chemotherapy  Today you received the following chemotherapy agents :  Gemcitabine.  To help prevent nausea and vomiting after your treatment, we encourage you to take your nausea medication as instructed by your physician.   If you develop nausea and vomiting that is not controlled by your nausea medication, call the clinic.   BELOW ARE SYMPTOMS THAT SHOULD BE REPORTED IMMEDIATELY:  *FEVER GREATER THAN 100.5 F  *CHILLS WITH OR WITHOUT FEVER  NAUSEA AND VOMITING THAT IS NOT CONTROLLED WITH YOUR NAUSEA MEDICATION  *UNUSUAL SHORTNESS OF BREATH  *UNUSUAL BRUISING OR BLEEDING  TENDERNESS IN MOUTH AND THROAT WITH OR WITHOUT PRESENCE OF ULCERS  *URINARY PROBLEMS  *BOWEL PROBLEMS  UNUSUAL RASH Items with * indicate a potential emergency and should be followed up as soon as possible.  Feel free to call the clinic you have any questions or concerns. The clinic phone number is [phone].    

## 2013-05-14 ENCOUNTER — Telehealth: Payer: Self-pay | Admitting: *Deleted

## 2013-05-14 ENCOUNTER — Other Ambulatory Visit: Payer: Self-pay | Admitting: *Deleted

## 2013-05-14 DIAGNOSIS — C259 Malignant neoplasm of pancreas, unspecified: Secondary | ICD-10-CM

## 2013-05-14 MED ORDER — POTASSIUM CHLORIDE CRYS ER 20 MEQ PO TBCR: 20.0000 meq | EXTENDED_RELEASE_TABLET | Freq: Every day | ORAL | Status: DC

## 2013-05-14 NOTE — Telephone Encounter (Signed)
Message copied by Wandalee Ferdinand on Fri May 14, 2013 12:34 PM ------      Message from: Thornton Papas B      Created: Thu May 13, 2013  9:28 PM       Please call patient, start kcl daily, bmet next visit ------

## 2013-05-14 NOTE — Telephone Encounter (Signed)
Left VM that K+ is low and MD wants her to start Ktab 20 meq daily. Start today.  Will call to [facility]. Call office to confirm.

## 2013-05-17 ENCOUNTER — Other Ambulatory Visit: Payer: Self-pay | Admitting: *Deleted

## 2013-05-17 DIAGNOSIS — C259 Malignant neoplasm of pancreas, unspecified: Secondary | ICD-10-CM

## 2013-05-17 MED ORDER — POTASSIUM CHLORIDE CRYS ER 20 MEQ PO TBCR: 20.0000 meq | EXTENDED_RELEASE_TABLET | Freq: Every day | ORAL | Status: DC

## 2013-05-17 NOTE — Telephone Encounter (Signed)
Pt requested potassium Rx to be sent to [facility] so she may use her discount. Same done.

## 2013-05-27 ENCOUNTER — Other Ambulatory Visit (HOSPITAL_BASED_OUTPATIENT_CLINIC_OR_DEPARTMENT_OTHER): Payer: Medicare Other | Admitting: Lab

## 2013-05-27 ENCOUNTER — Ambulatory Visit (HOSPITAL_BASED_OUTPATIENT_CLINIC_OR_DEPARTMENT_OTHER): Payer: Medicare Other

## 2013-05-27 ENCOUNTER — Other Ambulatory Visit: Payer: Self-pay | Admitting: Oncology

## 2013-05-27 ENCOUNTER — Ambulatory Visit (HOSPITAL_BASED_OUTPATIENT_CLINIC_OR_DEPARTMENT_OTHER): Payer: Medicare Other | Admitting: Nurse Practitioner

## 2013-05-27 ENCOUNTER — Telehealth: Payer: Self-pay | Admitting: Oncology

## 2013-05-27 ENCOUNTER — Telehealth: Payer: Self-pay | Admitting: *Deleted

## 2013-05-27 VITALS — BP 147/73 | HR 57 | Temp 97.8°F | Resp 18 | Ht 64.0 in | Wt 146.2 lb

## 2013-05-27 DIAGNOSIS — C259 Malignant neoplasm of pancreas, unspecified: Secondary | ICD-10-CM

## 2013-05-27 DIAGNOSIS — C250 Malignant neoplasm of head of pancreas: Secondary | ICD-10-CM

## 2013-05-27 DIAGNOSIS — Z5111 Encounter for antineoplastic chemotherapy: Secondary | ICD-10-CM

## 2013-05-27 DIAGNOSIS — G893 Neoplasm related pain (acute) (chronic): Secondary | ICD-10-CM

## 2013-05-27 DIAGNOSIS — I82409 Acute embolism and thrombosis of unspecified deep veins of unspecified lower extremity: Secondary | ICD-10-CM

## 2013-05-27 LAB — CBC WITH DIFFERENTIAL/PLATELET
BASO%: 0.8 % (ref 0.0–2.0)
Basophils Absolute: 0 10*3/uL (ref 0.0–0.1)
EOS%: 2.4 % (ref 0.0–7.0)
HCT: 33.4 % — ABNORMAL LOW (ref 34.8–46.6)
HGB: 10.9 g/dL — ABNORMAL LOW (ref 11.6–15.9)
MONO#: 0.3 10*3/uL (ref 0.1–0.9)
NEUT%: 64.5 % (ref 38.4–76.8)
RDW: 16.9 % — ABNORMAL HIGH (ref 11.2–14.5)
WBC: 3.3 10*3/uL — ABNORMAL LOW (ref 3.9–10.3)
lymph#: 0.7 10*3/uL — ABNORMAL LOW (ref 0.9–3.3)

## 2013-05-27 LAB — BASIC METABOLIC PANEL (CC13)
CO2: 24 mEq/L (ref 22–29)
Chloride: 109 mEq/L (ref 98–109)
Creatinine: 0.8 mg/dL (ref 0.6–1.1)
Potassium: 3.1 mEq/L — ABNORMAL LOW (ref 3.5–5.1)

## 2013-05-27 MED ORDER — PROCHLORPERAZINE MALEATE 10 MG PO TABS
ORAL_TABLET | ORAL | Status: AC
  Filled 2013-05-27: qty 1

## 2013-05-27 MED ORDER — SODIUM CHLORIDE 0.9 % IV SOLN
750.0000 mg/m2 | Freq: Once | INTRAVENOUS | Status: AC
  Administered 2013-05-27: 1292 mg via INTRAVENOUS
  Filled 2013-05-27: qty 33.98

## 2013-05-27 MED ORDER — PROCHLORPERAZINE MALEATE 10 MG PO TABS
10.0000 mg | ORAL_TABLET | Freq: Once | ORAL | Status: AC
  Administered 2013-05-27: 10 mg via ORAL

## 2013-05-27 MED ORDER — HEPARIN SOD (PORK) LOCK FLUSH 100 UNIT/ML IV SOLN
500.0000 [IU] | Freq: Once | INTRAVENOUS | Status: AC | PRN
  Administered 2013-05-27: 500 [IU]
  Filled 2013-05-27: qty 5

## 2013-05-27 MED ORDER — SODIUM CHLORIDE 0.9 % IV SOLN
Freq: Once | INTRAVENOUS | Status: AC
  Administered 2013-05-27: 12:00:00 via INTRAVENOUS

## 2013-05-27 MED ORDER — SODIUM CHLORIDE 0.9 % IJ SOLN
10.0000 mL | INTRAMUSCULAR | Status: DC | PRN
  Administered 2013-05-27: 10 mL
  Filled 2013-05-27: qty 10

## 2013-05-27 NOTE — Patient Instructions (Addendum)
[facility] Discharge Instructions for Patients Receiving Chemotherapy  Today you received the following chemotherapy agents Gemzar.  To help prevent nausea and vomiting after your treatment, we encourage you to take your nausea medication as prescribed.   If you develop nausea and vomiting that is not controlled by your nausea medication, call the clinic.   BELOW ARE SYMPTOMS THAT SHOULD BE REPORTED IMMEDIATELY:  *FEVER GREATER THAN 100.5 F  *CHILLS WITH OR WITHOUT FEVER  NAUSEA AND VOMITING THAT IS NOT CONTROLLED WITH YOUR NAUSEA MEDICATION  *UNUSUAL SHORTNESS OF BREATH  *UNUSUAL BRUISING OR BLEEDING  TENDERNESS IN MOUTH AND THROAT WITH OR WITHOUT PRESENCE OF ULCERS  *URINARY PROBLEMS  *BOWEL PROBLEMS  UNUSUAL RASH Items with * indicate a potential emergency and should be followed up as soon as possible.  Feel free to call the clinic you have any questions or concerns. The clinic phone number is [phone].    

## 2013-05-27 NOTE — Progress Notes (Signed)
OFFICE PROGRESS NOTE  Interval history:  Sheila Hurst returns as scheduled. She has completed 4 treatments with gemcitabine on a 2 week schedule. She overall is feeling well. She denies nausea/vomiting. No mouth sores. No diarrhea. No skin rash. She denies shortness breath, cough and fever. She denies pain. She is fatigued at times. Appetite is good. She denies any bleeding.   Objective: Blood pressure 147/73, pulse 57, temperature 97.8 F (36.6 C), temperature source Oral, resp. rate 18, height 5\' 4"  (1.626 m), weight 146 lb 3.2 oz (66.316 kg).  Oropharynx is without thrush or ulceration. Lungs are clear. No wheezes or rales. Regular cardiac rhythm. Port-A-Cath site is without erythema. Abdomen is soft and nontender. No hepatomegaly. Several ecchymoses at the abdominal wall at sites of previous Lovenox injections. Extremities without edema. Calves soft and nontender. Alert and oriented. No skin rash.  Lab Results: Lab Results  Component Value Date   WBC 3.3* 05/27/2013   HGB 10.9* 05/27/2013   HCT 33.4* 05/27/2013   MCV 89.3 05/27/2013   PLT 231 05/27/2013    Chemistry:    Chemistry      Component Value Date/Time   NA 144 05/27/2013 1003   NA 142 03/18/2013 0852   K 3.1* 05/27/2013 1003   K 3.0* 03/18/2013 0852   CL 106 03/18/2013 0852   CL 104 10/05/2012 1234   CO2 24 05/27/2013 1003   CO2 29 02/09/2013 0509   BUN 10.1 05/27/2013 1003   BUN 8 03/18/2013 0852   CREATININE 0.8 05/27/2013 1003   CREATININE 0.80 03/18/2013 0852      Component Value Date/Time   CALCIUM 9.0 05/27/2013 1003   CALCIUM 9.1 02/09/2013 0509   ALKPHOS 113 04/22/2013 0958   ALKPHOS 193* 02/09/2013 0509   AST 23 04/22/2013 0958   AST 15 02/09/2013 0509   ALT 29 04/22/2013 0958   ALT 14 02/09/2013 0509   BILITOT 0.34 04/22/2013 0958   BILITOT 0.3 02/09/2013 0509       Studies/Results: No results found.  Medications: I have reviewed the patient's current medications.  Assessment/Plan:  1. Adenocarcinoma of the  pancreas; mass in the neck of the pancreas, status post EUS guided biopsy 09/15/2012 with the cytology confirming malignant cells consistent with adenocarcinoma, T3 N0 by ultrasound. 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 Elevated CA 19-9. She began radiation and concurrent Xeloda chemotherapy on 10/19/2012, radiation completed 11/30/2012 -Whipple procedure 01/28/2013 with no residual carcinoma seen and stromal fibrosis consistent with post treatment effect.  -She began adjuvant gemcitabine 04/01/2013.  2. Pain secondary to pancreas cancer. Improved. 3. Right liver cyst. Smaller on MRI 09/28/2012 compared to 11/03/2008. 4. Hypertension currently taking Cardizem and Maxide. 5. Hyperlipidemia. 6. Family history of pancreas cancer (paternal aunt). 7. Anorexia/weight loss. Weight is stable. No longer taking Megace 8. Bradycardia when here on 11/11/2012. Atenolol was placed on hold. 9. Abdominal pain with CT scan 11/24/2012 showing decrease in the size of the pancreatic head mass and fairly marked antral wall thickening of the stomach with surrounding inflammatory change. She is taking Dilaudid. Due to the skin rash and "woozy" sensation after taking Dilaudid the Dilaudid was discontinued.Marland Kitchen She was given a new prescription for oxycodone 5 mg 1-2 tablets every 4 hours as needed and she was started on Protonix 40 mg daily. 10. Right leg DVT documented on a Doppler 11/26/2012-now on Lovenox. 11. Skin rash of unclear etiology. Question medication related. She was  instructed to discontinue Dilaudid and also to discontinue Xeloda. The rash resolved. 12. Neutropenia secondary to chemotherapy-resolved. 13. Hypokalemia. She is currently taking K-Dur 20 milliequivalents daily. She will increase to twice daily.  Disposition-Ms. Shehata appears well. She has completed 4 treatments of gemcitabine on a 2 week schedule. A total of 9 treatments  currently planned. Plan to proceed with treatment 5 today as scheduled. She will return for treatment 6 in 2 weeks. We will see her in followup in 4 weeks. She will contact the office in the interim with any problems.  Plan reviewed with Dr. Truett Perna.  Lonna Cobb ANP/GNP-BC

## 2013-05-27 NOTE — Patient Instructions (Signed)
Increase Kdur to twice a day.

## 2013-05-27 NOTE — Telephone Encounter (Signed)
Per staff message and POF I have scheduled appts.  JMW  

## 2013-05-27 NOTE — Telephone Encounter (Signed)
gva nd printed appt sched and avs for pt for OCT...emailed MW to add tx.

## 2013-05-28 ENCOUNTER — Telehealth: Payer: Self-pay | Admitting: *Deleted

## 2013-05-28 NOTE — Telephone Encounter (Signed)
Message copied by Wandalee Ferdinand on Fri May 28, 2013  4:36 PM ------      Message from: Thornton Papas B      Created: Thu May 27, 2013  9:42 PM       Please call patient, increase kcl to bid if she is taking it ------

## 2013-05-28 NOTE — Telephone Encounter (Signed)
Left message for patient to call back Monday regarding labs.

## 2013-05-31 ENCOUNTER — Telehealth: Payer: Self-pay | Admitting: *Deleted

## 2013-05-31 DIAGNOSIS — C259 Malignant neoplasm of pancreas, unspecified: Secondary | ICD-10-CM

## 2013-05-31 MED ORDER — POTASSIUM CHLORIDE CRYS ER 20 MEQ PO TBCR: 20.0000 meq | EXTENDED_RELEASE_TABLET | Freq: Two times a day (BID) | ORAL | Status: DC

## 2013-05-31 NOTE — Telephone Encounter (Signed)
Message copied by Caleb Popp on Mon May 31, 2013  9:43 AM ------      Message from: Thornton Papas B      Created: Thu May 27, 2013  9:42 PM       Please call patient, increase kcl to bid if she is taking it ------

## 2013-05-31 NOTE — Telephone Encounter (Signed)
Called pt, she was instructed by NP at office visit to increase KCL to twice daily. Pt requested new Rx to be sent to pharmacy with these instructions. Same done, per Dr. Truett Perna.

## 2013-06-06 ENCOUNTER — Other Ambulatory Visit: Payer: Self-pay | Admitting: Oncology

## 2013-06-10 ENCOUNTER — Telehealth: Payer: Self-pay | Admitting: *Deleted

## 2013-06-10 ENCOUNTER — Other Ambulatory Visit: Payer: Medicare Other | Admitting: Lab

## 2013-06-10 ENCOUNTER — Ambulatory Visit: Payer: Medicare Other

## 2013-06-10 NOTE — Telephone Encounter (Signed)
Called pt with Lab/infusion appt for 10/10. She voiced understanding, stated she will get someone to bring her to treatment.

## 2013-06-10 NOTE — Telephone Encounter (Signed)
Called pt to follow up on no show for chemo today. She reports she left a message that she couldn't make it in. Having car trouble. Has appt with surgeon on 10/10. Reviewed with Dr. Truett Perna, MD recommends pt come in for tx on 10/10. Left message for scheduler to see if appt can be worked in after appt with Dr. Donell Beers.

## 2013-06-11 ENCOUNTER — Telehealth: Payer: Self-pay | Admitting: *Deleted

## 2013-06-11 ENCOUNTER — Other Ambulatory Visit (HOSPITAL_BASED_OUTPATIENT_CLINIC_OR_DEPARTMENT_OTHER): Payer: Medicare Other | Admitting: Lab

## 2013-06-11 ENCOUNTER — Ambulatory Visit (HOSPITAL_BASED_OUTPATIENT_CLINIC_OR_DEPARTMENT_OTHER): Payer: Medicare Other

## 2013-06-11 ENCOUNTER — Ambulatory Visit (INDEPENDENT_AMBULATORY_CARE_PROVIDER_SITE_OTHER): Payer: Medicare Other | Admitting: General Surgery

## 2013-06-11 ENCOUNTER — Encounter (INDEPENDENT_AMBULATORY_CARE_PROVIDER_SITE_OTHER): Payer: Self-pay | Admitting: General Surgery

## 2013-06-11 ENCOUNTER — Encounter (INDEPENDENT_AMBULATORY_CARE_PROVIDER_SITE_OTHER): Payer: Self-pay

## 2013-06-11 VITALS — BP 146/77 | HR 54 | Temp 97.6°F | Resp 18

## 2013-06-11 VITALS — BP 136/80 | HR 72 | Temp 98.1°F | Resp 14 | Ht 64.0 in | Wt 139.6 lb

## 2013-06-11 DIAGNOSIS — C259 Malignant neoplasm of pancreas, unspecified: Secondary | ICD-10-CM

## 2013-06-11 DIAGNOSIS — Z5111 Encounter for antineoplastic chemotherapy: Secondary | ICD-10-CM

## 2013-06-11 DIAGNOSIS — C250 Malignant neoplasm of head of pancreas: Secondary | ICD-10-CM

## 2013-06-11 DIAGNOSIS — Z452 Encounter for adjustment and management of vascular access device: Secondary | ICD-10-CM

## 2013-06-11 DIAGNOSIS — K8681 Exocrine pancreatic insufficiency: Secondary | ICD-10-CM | POA: Insufficient documentation

## 2013-06-11 DIAGNOSIS — K8689 Other specified diseases of pancreas: Secondary | ICD-10-CM

## 2013-06-11 LAB — CBC WITH DIFFERENTIAL/PLATELET
BASO%: 0.8 % (ref 0.0–2.0)
EOS%: 1.7 % (ref 0.0–7.0)
Eosinophils Absolute: 0.1 10*3/uL (ref 0.0–0.5)
LYMPH%: 16.9 % (ref 14.0–49.7)
MCHC: 32.5 g/dL (ref 31.5–36.0)
MONO#: 0.4 10*3/uL (ref 0.1–0.9)
Platelets: 258 10*3/uL (ref 145–400)
RBC: 3.73 10*6/uL (ref 3.70–5.45)
RDW: 15.8 % — ABNORMAL HIGH (ref 11.2–14.5)
WBC: 4 10*3/uL (ref 3.9–10.3)
lymph#: 0.7 10*3/uL — ABNORMAL LOW (ref 0.9–3.3)

## 2013-06-11 LAB — BASIC METABOLIC PANEL (CC13)
BUN: 11.5 mg/dL (ref 7.0–26.0)
Calcium: 9.3 mg/dL (ref 8.4–10.4)
Chloride: 107 mEq/L (ref 98–109)
Creatinine: 0.9 mg/dL (ref 0.6–1.1)
Glucose: 188 mg/dl — ABNORMAL HIGH (ref 70–140)
Potassium: 3.2 mEq/L — ABNORMAL LOW (ref 3.5–5.1)
Sodium: 141 mEq/L (ref 136–145)

## 2013-06-11 MED ORDER — HEPARIN SOD (PORK) LOCK FLUSH 100 UNIT/ML IV SOLN
500.0000 [IU] | Freq: Once | INTRAVENOUS | Status: AC | PRN
  Administered 2013-06-11: 500 [IU]
  Filled 2013-06-11: qty 5

## 2013-06-11 MED ORDER — PROCHLORPERAZINE MALEATE 10 MG PO TABS
10.0000 mg | ORAL_TABLET | Freq: Once | ORAL | Status: AC
  Administered 2013-06-11: 10 mg via ORAL

## 2013-06-11 MED ORDER — SODIUM CHLORIDE 0.9 % IV SOLN
750.0000 mg/m2 | Freq: Once | INTRAVENOUS | Status: AC
  Administered 2013-06-11: 1292 mg via INTRAVENOUS
  Filled 2013-06-11: qty 33.98

## 2013-06-11 MED ORDER — SODIUM CHLORIDE 0.9 % IV SOLN
Freq: Once | INTRAVENOUS | Status: AC
  Administered 2013-06-11: 14:00:00 via INTRAVENOUS

## 2013-06-11 MED ORDER — ALTEPLASE 2 MG IJ SOLR
2.0000 mg | Freq: Once | INTRAMUSCULAR | Status: AC | PRN
  Administered 2013-06-11: 2 mg
  Filled 2013-06-11: qty 2

## 2013-06-11 MED ORDER — SODIUM CHLORIDE 0.9 % IJ SOLN
10.0000 mL | INTRAMUSCULAR | Status: DC | PRN
  Administered 2013-06-11: 10 mL
  Filled 2013-06-11: qty 10

## 2013-06-11 MED ORDER — PROCHLORPERAZINE MALEATE 10 MG PO TABS
ORAL_TABLET | ORAL | Status: AC
  Filled 2013-06-11: qty 1

## 2013-06-11 MED ORDER — PANCRELIPASE (LIP-PROT-AMYL) 24000-76000 UNITS PO CPEP: ORAL_CAPSULE | ORAL | Status: DC

## 2013-06-11 NOTE — Progress Notes (Signed)
HISTORY: Patient is approximately 5 months status post pancreaticoduodenectomy for cancer of the pancreatic head.  She had a good response to neoadjuvant chemoradiation. She did very well postoperatively other than some issues with weight loss and appetite. She did well responding to Megace for appetite stimulation. Her weight has now stabilized and she is doing very well on adjuvant gemcitabine. She is having minimal side effects from this. She is still requiring some pain medication due to abdominal pain in the epigastric region.  She has approximately 3 stools a day that are fairly loose and foul-smelling. She has not been on any pancreatic enzymes.   PERTINENT REVIEW OF SYSTEMS: Otherwise negative x 11.    Filed Vitals:   06/11/13 1108  BP: 136/80  Pulse: 72  Temp: 98.1 F (36.7 C)  Resp: 14   Filed Weights   06/11/13 1108  Weight: 139 lb 9.6 oz (63.322 kg)     EXAM: Head: Normocephalic and atraumatic.  Eyes:  Conjunctivae are normal. Pupils are equal, round, and reactive to light. No scleral icterus.  Neck:  Normal range of motion. Neck supple. No tracheal deviation present. No thyromegaly present.  Resp: No respiratory distress, normal effort. Abd:  Abdomen is soft, non distended.  Her incision is tender. No masses are palpable.  There is no rebound and no guarding.  Neurological: Alert and oriented to person, place, and time. Coordination normal.  Skin: Skin is warm and dry. No rash noted. No diaphoretic. No erythema. No pallor.  Psychiatric: Normal mood and affect. Normal behavior. Judgment and thought content normal.      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 I have placed her on a medium dose of Creon.  Also, this may be contributing to the abdominal pain that she is having.  I gave her samples for 2 weeks, and has a prescription to her pharmacy.  Pancreatic  adenocarcinoma Patient is continuing on adjuvant gemcitabine therapy for her pancreatic adenocarcinoma. This did completely disappear pathologically following her neoadjuvant chemoradiation.  After chemotherapy, she will receive blood work and scans per Dr. Truett Perna.  I will see her back in approximately 4-6 months.      Maudry Diego, MD Surgical Oncology, General & Endocrine Surgery [facility]  Thora Lance, MD Manus Gunning Bryan Lemma, MD

## 2013-06-11 NOTE — Patient Instructions (Signed)
Take the creon capsules 1 with small meals and snacks, and 2 with larger meals.    Follow up in 4-6 months.

## 2013-06-11 NOTE — Progress Notes (Signed)
1255 PAC flushing well but no positive blood return. 2.2 ml Cathflo instilled.   1332 Positive blood return present. Cathflo 2.2 ml removed.

## 2013-06-11 NOTE — Assessment & Plan Note (Signed)
Patient is continuing on adjuvant gemcitabine therapy for her pancreatic adenocarcinoma. This did completely disappear pathologically following her neoadjuvant chemoradiation.  After chemotherapy, she will receive blood work and scans per Dr. Truett Perna.  I will see her back in approximately 4-6 months.

## 2013-06-11 NOTE — Patient Instructions (Signed)
[facility] Discharge Instructions for Patients Receiving Chemotherapy  Today you received the following chemotherapy agent Gemzar.  To help prevent nausea and vomiting after your treatment, we encourage you to take your nausea medication.   If you develop nausea and vomiting that is not controlled by your nausea medication, call the clinic.   BELOW ARE SYMPTOMS THAT SHOULD BE REPORTED IMMEDIATELY:  *FEVER GREATER THAN 100.5 F  *CHILLS WITH OR WITHOUT FEVER  NAUSEA AND VOMITING THAT IS NOT CONTROLLED WITH YOUR NAUSEA MEDICATION  *UNUSUAL SHORTNESS OF BREATH  *UNUSUAL BRUISING OR BLEEDING  TENDERNESS IN MOUTH AND THROAT WITH OR WITHOUT PRESENCE OF ULCERS  *URINARY PROBLEMS  *BOWEL PROBLEMS  UNUSUAL RASH Items with * indicate a potential emergency and should be followed up as soon as possible.  Feel free to call the clinic you have any questions or concerns. The clinic phone number is [phone].    

## 2013-06-11 NOTE — Telephone Encounter (Signed)
Message copied by Caleb Popp on Fri Jun 11, 2013  4:46 PM ------      Message from: Lonna Cobb K      Created: Fri Jun 11, 2013  4:34 PM       Is she taking potassium? On med list as taking two times a day.       ----- Message -----         From: [facility]         Sent: 06/11/2013  12:22 PM           To: Rana Snare, NP                   ------

## 2013-06-11 NOTE — Assessment & Plan Note (Signed)
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 I have placed her on a medium dose of Creon.  Also, this may be contributing to the abdominal pain that she is having.  I gave her samples for 2 weeks, and has a prescription to her pharmacy.

## 2013-06-22 ENCOUNTER — Other Ambulatory Visit: Payer: Self-pay | Admitting: Oncology

## 2013-06-24 ENCOUNTER — Other Ambulatory Visit (HOSPITAL_BASED_OUTPATIENT_CLINIC_OR_DEPARTMENT_OTHER): Payer: Medicare Other | Admitting: Lab

## 2013-06-24 ENCOUNTER — Ambulatory Visit (HOSPITAL_BASED_OUTPATIENT_CLINIC_OR_DEPARTMENT_OTHER): Payer: Medicare Other | Admitting: Oncology

## 2013-06-24 ENCOUNTER — Telehealth: Payer: Self-pay | Admitting: *Deleted

## 2013-06-24 ENCOUNTER — Other Ambulatory Visit: Payer: Self-pay | Admitting: *Deleted

## 2013-06-24 ENCOUNTER — Ambulatory Visit (HOSPITAL_BASED_OUTPATIENT_CLINIC_OR_DEPARTMENT_OTHER): Payer: Medicare Other

## 2013-06-24 VITALS — BP 157/77 | HR 59 | Temp 97.7°F | Resp 18 | Ht 64.0 in | Wt 145.2 lb

## 2013-06-24 DIAGNOSIS — C250 Malignant neoplasm of head of pancreas: Secondary | ICD-10-CM

## 2013-06-24 DIAGNOSIS — K7689 Other specified diseases of liver: Secondary | ICD-10-CM

## 2013-06-24 DIAGNOSIS — Z5111 Encounter for antineoplastic chemotherapy: Secondary | ICD-10-CM

## 2013-06-24 DIAGNOSIS — C259 Malignant neoplasm of pancreas, unspecified: Secondary | ICD-10-CM

## 2013-06-24 DIAGNOSIS — E876 Hypokalemia: Secondary | ICD-10-CM

## 2013-06-24 DIAGNOSIS — I82409 Acute embolism and thrombosis of unspecified deep veins of unspecified lower extremity: Secondary | ICD-10-CM

## 2013-06-24 DIAGNOSIS — I10 Essential (primary) hypertension: Secondary | ICD-10-CM

## 2013-06-24 LAB — CBC WITH DIFFERENTIAL/PLATELET
BASO%: 0.5 % (ref 0.0–2.0)
Basophils Absolute: 0 10*3/uL (ref 0.0–0.1)
EOS%: 2.8 % (ref 0.0–7.0)
HCT: 33 % — ABNORMAL LOW (ref 34.8–46.6)
HGB: 10.8 g/dL — ABNORMAL LOW (ref 11.6–15.9)
MCHC: 32.6 g/dL (ref 31.5–36.0)
MCV: 91.4 fL (ref 79.5–101.0)
MONO#: 0.5 10*3/uL (ref 0.1–0.9)
MONO%: 12.2 % (ref 0.0–14.0)
RBC: 3.61 10*6/uL — ABNORMAL LOW (ref 3.70–5.45)
RDW: 14.9 % — ABNORMAL HIGH (ref 11.2–14.5)
WBC: 3.8 10*3/uL — ABNORMAL LOW (ref 3.9–10.3)
lymph#: 0.6 10*3/uL — ABNORMAL LOW (ref 0.9–3.3)

## 2013-06-24 LAB — BASIC METABOLIC PANEL (CC13)
Anion Gap: 11 mEq/L (ref 3–11)
CO2: 28 mEq/L (ref 22–29)
Calcium: 9.5 mg/dL (ref 8.4–10.4)
Chloride: 103 mEq/L (ref 98–109)
Potassium: 3 mEq/L — CL (ref 3.5–5.1)
Sodium: 142 mEq/L (ref 136–145)

## 2013-06-24 MED ORDER — HEPARIN SOD (PORK) LOCK FLUSH 100 UNIT/ML IV SOLN
500.0000 [IU] | Freq: Once | INTRAVENOUS | Status: AC | PRN
  Administered 2013-06-24: 500 [IU]
  Filled 2013-06-24: qty 5

## 2013-06-24 MED ORDER — SODIUM CHLORIDE 0.9 % IV SOLN
Freq: Once | INTRAVENOUS | Status: AC
  Administered 2013-06-24: 13:00:00 via INTRAVENOUS

## 2013-06-24 MED ORDER — SODIUM CHLORIDE 0.9 % IV SOLN
750.0000 mg/m2 | Freq: Once | INTRAVENOUS | Status: AC
  Administered 2013-06-24: 1292 mg via INTRAVENOUS
  Filled 2013-06-24: qty 34

## 2013-06-24 MED ORDER — POTASSIUM CHLORIDE CRYS ER 20 MEQ PO TBCR: 20.0000 meq | EXTENDED_RELEASE_TABLET | Freq: Three times a day (TID) | ORAL | Status: DC

## 2013-06-24 MED ORDER — PROCHLORPERAZINE MALEATE 10 MG PO TABS
ORAL_TABLET | ORAL | Status: AC
  Filled 2013-06-24: qty 1

## 2013-06-24 MED ORDER — PROCHLORPERAZINE MALEATE 10 MG PO TABS
10.0000 mg | ORAL_TABLET | Freq: Once | ORAL | Status: AC
  Administered 2013-06-24: 10 mg via ORAL

## 2013-06-24 MED ORDER — SODIUM CHLORIDE 0.9 % IJ SOLN
10.0000 mL | INTRAMUSCULAR | Status: DC | PRN
  Administered 2013-06-24: 10 mL
  Filled 2013-06-24: qty 10

## 2013-06-24 NOTE — Telephone Encounter (Signed)
Per staff message and POF I have scheduled appts.  JMW  

## 2013-06-24 NOTE — Patient Instructions (Signed)
[facility] Discharge Instructions for Patients Receiving Chemotherapy  Today you received the following chemotherapy agent Gemzar.  To help prevent nausea and vomiting after your treatment, we encourage you to take your nausea medication.   If you develop nausea and vomiting that is not controlled by your nausea medication, call the clinic.   BELOW ARE SYMPTOMS THAT SHOULD BE REPORTED IMMEDIATELY:  *FEVER GREATER THAN 100.5 F  *CHILLS WITH OR WITHOUT FEVER  NAUSEA AND VOMITING THAT IS NOT CONTROLLED WITH YOUR NAUSEA MEDICATION  *UNUSUAL SHORTNESS OF BREATH  *UNUSUAL BRUISING OR BLEEDING  TENDERNESS IN MOUTH AND THROAT WITH OR WITHOUT PRESENCE OF ULCERS  *URINARY PROBLEMS  *BOWEL PROBLEMS  UNUSUAL RASH Items with * indicate a potential emergency and should be followed up as soon as possible.  Feel free to call the clinic you have any questions or concerns. The clinic phone number is [phone].    

## 2013-06-24 NOTE — Progress Notes (Signed)
   [facility]    OFFICE PROGRESS NOTE   INTERVAL HISTORY:   She returns as scheduled. She continues every 2 week gemcitabine. No nausea, fever, or skin rash. Good appetite. She has multiple bowel movements per day. She was started on Pancrease by Dr. Donell Beers. She began taking potassium twice daily 2 weeks ago.  Objective:  Vital signs in last 24 hours:  Blood pressure 157/77, pulse 59, temperature 97.7 F (36.5 C), temperature source Oral, resp. rate 18, height 5\' 4"  (1.626 m), weight 145 lb 3.2 oz (65.862 kg).    HEENT: No thrush or ulcer Resp: Lungs clear bilaterally Cardio: Regular rate and rhythm GI: No hepatomegaly, no mass Vascular: No leg edema   Portacath/PICC-without erythema  Lab Results:  Lab Results  Component Value Date   WBC 3.8* 06/24/2013   HGB 10.8* 06/24/2013   HCT 33.0* 06/24/2013   MCV 91.4 06/24/2013   PLT 198 06/24/2013   ANC 2.6 Potassium 3.0, creatinine 0.9    Medications: I have reviewed the patient's current medications.  Assessment/Plan: 1. Adenocarcinoma of the pancreas; mass in the neck of the pancreas, status post EUS guided biopsy 09/15/2012 with the cytology confirming malignant cells consistent with adenocarcinoma, T3 N0 by ultrasound. 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 Elevated CA 19-9. She began radiation and concurrent Xeloda chemotherapy on 10/19/2012, radiation completed 11/30/2012 -Whipple procedure 01/28/2013 with no residual carcinoma seen and stromal fibrosis consistent with post treatment effect.  -She began adjuvant gemcitabine 04/01/2013.  2. Pain secondary to pancreas cancer. Improved. 3. Right liver cyst. Smaller on MRI 09/28/2012 compared to 11/03/2008. 4. Hypertension currently taking Cardizem and Maxide. 5. Hyperlipidemia. 6. Family history of pancreas cancer (paternal aunt). 7. Anorexia/weight loss. Weight is stable. No  longer taking Megace 8. Bradycardia when here on 11/11/2012. Atenolol was placed on hold. 9. Abdominal pain with CT scan 11/24/2012 showing decrease in the size of the pancreatic head mass and fairly marked antral wall thickening of the stomach with surrounding inflammatory change. She is taking Dilaudid. Due to the skin rash and "woozy" sensation after taking Dilaudid the Dilaudid was discontinued.Marland Kitchen She was given a new prescription for oxycodone 5 mg 1-2 tablets every 4 hours as needed and she was started on Protonix 40 mg daily. 10. Right leg DVT documented on a Doppler 11/26/2012-now on Lovenox. 11. Skin rash of unclear etiology. Question medication related. She was instructed to discontinue Dilaudid and also to discontinue Xeloda. The rash resolved. 12. Neutropenia secondary to chemotherapy-resolved. 13. Hypokalemia. She is currently taking K-Dur 20 milliequivalents daily. She will increase to 3 times daily.    Disposition:  Ms. Gren continues to tolerate the gemcitabine well. She has completed 6 treatments to date. The plan is to complete a total of 9 treatments with gemcitabine.  Ms. Higley has persistent hypokalemia, likely related to frequent bowel movements and diuretic therapy. She will increase her potassium chloride to 3 times per day and we will check a potassium level when she returns in 2 weeks.  Ms. Eischeid will be scheduled for an office visit: [place] with the last planned cycle of gemcitabine on 07/22/2013. She declined an influenza vaccine. We will check a CA 19 Which is your 07/22/2013.   Thornton Papas, MD  06/24/2013  12:08 PM

## 2013-06-24 NOTE — Telephone Encounter (Signed)
appts made and printed. Pt is aware that tx will be added. i emailed MW to added the tx....td

## 2013-07-07 ENCOUNTER — Other Ambulatory Visit: Payer: Self-pay | Admitting: Oncology

## 2013-07-08 ENCOUNTER — Other Ambulatory Visit: Payer: Medicare Other | Admitting: Lab

## 2013-07-08 ENCOUNTER — Telehealth: Payer: Self-pay | Admitting: *Deleted

## 2013-07-08 ENCOUNTER — Ambulatory Visit: Payer: Medicare Other

## 2013-07-08 NOTE — Telephone Encounter (Signed)
Per desk RN I have moved appt from today to tomorrow.  Patietn aware

## 2013-07-08 NOTE — Telephone Encounter (Signed)
Message from pt requesting to reschedule treatment for 07/09/13. Having transportation issues. Requested chemo scheduler call pt to reschedule.

## 2013-07-09 ENCOUNTER — Ambulatory Visit (HOSPITAL_BASED_OUTPATIENT_CLINIC_OR_DEPARTMENT_OTHER): Payer: Medicare Other

## 2013-07-09 ENCOUNTER — Other Ambulatory Visit (HOSPITAL_BASED_OUTPATIENT_CLINIC_OR_DEPARTMENT_OTHER): Payer: Medicare Other | Admitting: Lab

## 2013-07-09 VITALS — BP 178/77 | HR 64 | Temp 97.8°F

## 2013-07-09 DIAGNOSIS — C250 Malignant neoplasm of head of pancreas: Secondary | ICD-10-CM

## 2013-07-09 DIAGNOSIS — C259 Malignant neoplasm of pancreas, unspecified: Secondary | ICD-10-CM

## 2013-07-09 DIAGNOSIS — Z5111 Encounter for antineoplastic chemotherapy: Secondary | ICD-10-CM

## 2013-07-09 LAB — COMPREHENSIVE METABOLIC PANEL (CC13)
ALT: 22 U/L (ref 0–55)
AST: 28 U/L (ref 5–34)
Alkaline Phosphatase: 143 U/L (ref 40–150)
Anion Gap: 13 mEq/L — ABNORMAL HIGH (ref 3–11)
CO2: 26 mEq/L (ref 22–29)
Calcium: 9.7 mg/dL (ref 8.4–10.4)
Chloride: 105 mEq/L (ref 98–109)
Creatinine: 0.8 mg/dL (ref 0.6–1.1)
Sodium: 143 mEq/L (ref 136–145)
Total Protein: 7.2 g/dL (ref 6.4–8.3)

## 2013-07-09 LAB — CBC WITH DIFFERENTIAL/PLATELET
BASO%: 0.7 % (ref 0.0–2.0)
EOS%: 3.1 % (ref 0.0–7.0)
Eosinophils Absolute: 0.1 10*3/uL (ref 0.0–0.5)
MCH: 30.1 pg (ref 25.1–34.0)
MCHC: 32.5 g/dL (ref 31.5–36.0)
MONO#: 0.3 10*3/uL (ref 0.1–0.9)
Platelets: 255 10*3/uL (ref 145–400)
RBC: 3.71 10*6/uL (ref 3.70–5.45)
RDW: 14.7 % — ABNORMAL HIGH (ref 11.2–14.5)
WBC: 2.8 10*3/uL — ABNORMAL LOW (ref 3.9–10.3)
lymph#: 0.6 10*3/uL — ABNORMAL LOW (ref 0.9–3.3)

## 2013-07-09 MED ORDER — PROCHLORPERAZINE MALEATE 10 MG PO TABS
10.0000 mg | ORAL_TABLET | Freq: Once | ORAL | Status: AC
  Administered 2013-07-09: 10 mg via ORAL

## 2013-07-09 MED ORDER — HEPARIN SOD (PORK) LOCK FLUSH 100 UNIT/ML IV SOLN
500.0000 [IU] | Freq: Once | INTRAVENOUS | Status: AC | PRN
  Administered 2013-07-09: 500 [IU]
  Filled 2013-07-09: qty 5

## 2013-07-09 MED ORDER — SODIUM CHLORIDE 0.9 % IJ SOLN
10.0000 mL | INTRAMUSCULAR | Status: DC | PRN
  Administered 2013-07-09: 10 mL
  Filled 2013-07-09: qty 10

## 2013-07-09 MED ORDER — SODIUM CHLORIDE 0.9 % IV SOLN
750.0000 mg/m2 | Freq: Once | INTRAVENOUS | Status: AC
  Administered 2013-07-09: 1292 mg via INTRAVENOUS
  Filled 2013-07-09: qty 33.98

## 2013-07-09 MED ORDER — PROCHLORPERAZINE MALEATE 10 MG PO TABS
ORAL_TABLET | ORAL | Status: AC
  Filled 2013-07-09: qty 1

## 2013-07-09 MED ORDER — SODIUM CHLORIDE 0.9 % IV SOLN
Freq: Once | INTRAVENOUS | Status: AC
  Administered 2013-07-09: 10:00:00 via INTRAVENOUS

## 2013-07-09 NOTE — Progress Notes (Signed)
Okay to treat pt today with chemotherapy and today's CBC /diff result.

## 2013-07-09 NOTE — Progress Notes (Signed)
Desk nurse notified of elevated BP.  Pt reports did not take Cardizem this am - is picking up scrip after chemo.

## 2013-07-09 NOTE — Patient Instructions (Signed)
[facility] Discharge Instructions for Patients Receiving Chemotherapy  Today you received the following chemotherapy agents: Gemzar  To help prevent nausea and vomiting after your treatment, we encourage you to take your nausea medication.  Take it as often as prescribed.     If you develop nausea and vomiting that is not controlled by your nausea medication, call the clinic. If it is after clinic hours your family physician or the after hours number for the clinic or go to the Emergency Department.   BELOW ARE SYMPTOMS THAT SHOULD BE REPORTED IMMEDIATELY:  *FEVER GREATER THAN 100.5 F  *CHILLS WITH OR WITHOUT FEVER  NAUSEA AND VOMITING THAT IS NOT CONTROLLED WITH YOUR NAUSEA MEDICATION  *UNUSUAL SHORTNESS OF BREATH  *UNUSUAL BRUISING OR BLEEDING  TENDERNESS IN MOUTH AND THROAT WITH OR WITHOUT PRESENCE OF ULCERS  *URINARY PROBLEMS  *BOWEL PROBLEMS  UNUSUAL RASH Items with * indicate a potential emergency and should be followed up as soon as possible.  Feel free to call the clinic you have any questions or concerns. The clinic phone number is [phone].   I have been informed and understand all the instructions given to me. I know to contact the clinic, my physician, or go to the Emergency Department if any problems should occur. I do not have any questions at this time, but understand that I may call the clinic during office hours   should I have any questions or need assistance in obtaining follow up care.    __________________________________________  _____________  __________ Signature of Patient or Authorized Representative            Date                   Time    __________________________________________ Nurse's Signature

## 2013-07-18 ENCOUNTER — Other Ambulatory Visit: Payer: Self-pay | Admitting: Oncology

## 2013-07-21 ENCOUNTER — Other Ambulatory Visit (INDEPENDENT_AMBULATORY_CARE_PROVIDER_SITE_OTHER): Payer: Self-pay | Admitting: General Surgery

## 2013-07-22 ENCOUNTER — Ambulatory Visit (HOSPITAL_BASED_OUTPATIENT_CLINIC_OR_DEPARTMENT_OTHER): Payer: Medicare Other

## 2013-07-22 ENCOUNTER — Other Ambulatory Visit (HOSPITAL_BASED_OUTPATIENT_CLINIC_OR_DEPARTMENT_OTHER): Payer: Medicare Other | Admitting: Lab

## 2013-07-22 ENCOUNTER — Ambulatory Visit (HOSPITAL_BASED_OUTPATIENT_CLINIC_OR_DEPARTMENT_OTHER): Payer: Medicare Other | Admitting: Nurse Practitioner

## 2013-07-22 ENCOUNTER — Telehealth: Payer: Self-pay | Admitting: Oncology

## 2013-07-22 VITALS — BP 124/69 | HR 74 | Temp 97.8°F | Resp 18 | Ht 64.0 in | Wt 141.9 lb

## 2013-07-22 DIAGNOSIS — C259 Malignant neoplasm of pancreas, unspecified: Secondary | ICD-10-CM

## 2013-07-22 DIAGNOSIS — Z5111 Encounter for antineoplastic chemotherapy: Secondary | ICD-10-CM

## 2013-07-22 DIAGNOSIS — Z800 Family history of malignant neoplasm of digestive organs: Secondary | ICD-10-CM

## 2013-07-22 DIAGNOSIS — I10 Essential (primary) hypertension: Secondary | ICD-10-CM

## 2013-07-22 DIAGNOSIS — C250 Malignant neoplasm of head of pancreas: Secondary | ICD-10-CM

## 2013-07-22 DIAGNOSIS — I82409 Acute embolism and thrombosis of unspecified deep veins of unspecified lower extremity: Secondary | ICD-10-CM

## 2013-07-22 DIAGNOSIS — E876 Hypokalemia: Secondary | ICD-10-CM

## 2013-07-22 LAB — BASIC METABOLIC PANEL (CC13)
Anion Gap: 12 mEq/L — ABNORMAL HIGH (ref 3–11)
Calcium: 9.8 mg/dL (ref 8.4–10.4)
Glucose: 141 mg/dl — ABNORMAL HIGH (ref 70–140)
Sodium: 140 mEq/L (ref 136–145)

## 2013-07-22 LAB — CBC WITH DIFFERENTIAL/PLATELET
BASO%: 0.5 % (ref 0.0–2.0)
HGB: 11.1 g/dL — ABNORMAL LOW (ref 11.6–15.9)
LYMPH%: 11 % — ABNORMAL LOW (ref 14.0–49.7)
MCH: 29.7 pg (ref 25.1–34.0)
MCHC: 32.5 g/dL (ref 31.5–36.0)
MCV: 91.4 fL (ref 79.5–101.0)
MONO%: 10.2 % (ref 0.0–14.0)
Platelets: 196 10*3/uL (ref 145–400)
RBC: 3.72 10*6/uL (ref 3.70–5.45)
lymph#: 0.5 10*3/uL — ABNORMAL LOW (ref 0.9–3.3)

## 2013-07-22 MED ORDER — HEPARIN SOD (PORK) LOCK FLUSH 100 UNIT/ML IV SOLN
500.0000 [IU] | Freq: Once | INTRAVENOUS | Status: AC | PRN
  Administered 2013-07-22: 500 [IU]
  Filled 2013-07-22: qty 5

## 2013-07-22 MED ORDER — SODIUM CHLORIDE 0.9 % IV SOLN
750.0000 mg/m2 | Freq: Once | INTRAVENOUS | Status: AC
  Administered 2013-07-22: 1292 mg via INTRAVENOUS
  Filled 2013-07-22: qty 33.98

## 2013-07-22 MED ORDER — PROCHLORPERAZINE MALEATE 10 MG PO TABS
10.0000 mg | ORAL_TABLET | Freq: Once | ORAL | Status: AC
  Administered 2013-07-22: 10 mg via ORAL

## 2013-07-22 MED ORDER — SODIUM CHLORIDE 0.9 % IJ SOLN
10.0000 mL | INTRAMUSCULAR | Status: DC | PRN
  Administered 2013-07-22: 10 mL
  Filled 2013-07-22: qty 10

## 2013-07-22 MED ORDER — SODIUM CHLORIDE 0.9 % IV SOLN
Freq: Once | INTRAVENOUS | Status: AC
  Administered 2013-07-22: 11:00:00 via INTRAVENOUS

## 2013-07-22 MED ORDER — PROCHLORPERAZINE MALEATE 10 MG PO TABS
ORAL_TABLET | ORAL | Status: AC
  Filled 2013-07-22: qty 1

## 2013-07-22 NOTE — Telephone Encounter (Signed)
Gave pt appt for lab and MD on March 2015 , flush on January 2015

## 2013-07-22 NOTE — Progress Notes (Signed)
OFFICE PROGRESS NOTE  Interval history:  Sheila Hurst returns as scheduled. She continues every 2 week gemcitabine. She denies nausea/vomiting. No mouth sores. No fever. She denies shortness of breath and cough. She continues to have a good appetite. She denies diarrhea. She has noted a decrease in the number of bowel movements per day.   Objective: Blood pressure 124/69, pulse 74, temperature 97.8 F (36.6 C), temperature source Oral, resp. rate 18, height 5\' 4"  (1.626 m), weight 141 lb 14.4 oz (64.365 kg).  No thrush or ulcerations. Lungs are clear. Regular cardiac rhythm. Port-A-Cath site is without erythema. Abdomen is soft and nontender. No hepatomegaly. Extremities are without edema.  Lab Results: Lab Results  Component Value Date   WBC 4.9 07/22/2013   HGB 11.1* 07/22/2013   HCT 34.0* 07/22/2013   MCV 91.4 07/22/2013   PLT 196 07/22/2013    Chemistry:    Chemistry      Component Value Date/Time   NA 143 07/09/2013 0902   NA 142 03/18/2013 0852   K 3.3* 07/09/2013 0902   K 3.0* 03/18/2013 0852   CL 106 03/18/2013 0852   CL 104 10/05/2012 1234   CO2 26 07/09/2013 0902   CO2 29 02/09/2013 0509   BUN 10.2 07/09/2013 0902   BUN 8 03/18/2013 0852   CREATININE 0.8 07/09/2013 0902   CREATININE 0.80 03/18/2013 0852      Component Value Date/Time   CALCIUM 9.7 07/09/2013 0902   CALCIUM 9.1 02/09/2013 0509   ALKPHOS 143 07/09/2013 0902   ALKPHOS 193* 02/09/2013 0509   AST 28 07/09/2013 0902   AST 15 02/09/2013 0509   ALT 22 07/09/2013 0902   ALT 14 02/09/2013 0509   BILITOT 0.68 07/09/2013 0902   BILITOT 0.3 02/09/2013 0509       Studies/Results: No results found.  Medications: I have reviewed the patient's current medications.  Assessment/Plan:  1. Adenocarcinoma of the pancreas; mass in the neck of the pancreas, status post EUS guided biopsy 09/15/2012 with the cytology confirming malignant cells consistent with adenocarcinoma, T3 N0 by ultrasound. 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 Elevated CA 19-9. She began radiation and concurrent Xeloda chemotherapy on 10/19/2012, radiation completed 11/30/2012 -Whipple procedure 01/28/2013 with no residual carcinoma seen and stromal fibrosis consistent with post treatment effect.  -She began adjuvant gemcitabine 04/01/2013.  2. Pain secondary to pancreas cancer. Improved. 3. Right liver cyst. Smaller on MRI 09/28/2012 compared to 11/03/2008. 4. Hypertension currently taking Cardizem and Maxide. 5. Hyperlipidemia. 6. Family history of pancreas cancer (paternal aunt). 7. Anorexia/weight loss. Weight is stable. No longer taking Megace. 8. Bradycardia when here on 11/11/2012. Atenolol was placed on hold. 9. Abdominal pain with CT scan 11/24/2012 showing decrease in the size of the pancreatic head mass and fairly marked antral wall thickening of the stomach with surrounding inflammatory change. She is taking Dilaudid. Due to the skin rash and "woozy" sensation after taking Dilaudid the Dilaudid was discontinued.Marland Kitchen She was given a new prescription for oxycodone 5 mg 1-2 tablets every 4 hours as needed and she was started on Protonix 40 mg daily. 10. Right leg DVT documented on a Doppler 11/26/2012-now on Lovenox. 11. Skin rash of unclear etiology. Question medication related. She was instructed to discontinue Dilaudid and also to discontinue Xeloda. The rash resolved. 12. Neutropenia secondary to chemotherapy-resolved. 13. Hypokalemia. Kdur was increased to 3 times daily following an office visit 06/24/2013.  Disposition-she  appears stable. She continues to tolerate the gemcitabine well. She has completed 8 treatments to date. Plan to proceed with the ninth and final treatment today as scheduled.  We will followup on the potassium level from today and give further instructions regarding the potassium supplement.  She has been maintained on anticoagulation since  found to have a right leg DVT 11/26/2012. She is currently on Lovenox. We gave her instructions to discontinue Lovenox after today's dose.  She will return for a Port-A-Cath flush in 6 weeks. She will return for a followup visit, Port-A-Cath flush and CA 19-9 in 3 months. She will contact the office in the interim with any problems.  Plan reviewed with Dr. Truett Perna.  Lonna Cobb ANP/GNP-BC

## 2013-07-22 NOTE — Patient Instructions (Signed)
[facility] Discharge Instructions for Patients Receiving Chemotherapy  Today you received the following chemotherapy agent: Gemzar  To help prevent nausea and vomiting after your treatment, we encourage you to take your nausea medication as prescribed. You received Compazine at 11:00 in the infusion room this morning, 07/22/13   If you develop nausea and vomiting that is not controlled by your nausea medication, call the clinic.   BELOW ARE SYMPTOMS THAT SHOULD BE REPORTED IMMEDIATELY:  *FEVER GREATER THAN 100.5 F  *CHILLS WITH OR WITHOUT FEVER  NAUSEA AND VOMITING THAT IS NOT CONTROLLED WITH YOUR NAUSEA MEDICATION  *UNUSUAL SHORTNESS OF BREATH  *UNUSUAL BRUISING OR BLEEDING  TENDERNESS IN MOUTH AND THROAT WITH OR WITHOUT PRESENCE OF ULCERS  *URINARY PROBLEMS  *BOWEL PROBLEMS  UNUSUAL RASH Items with * indicate a potential emergency and should be followed up as soon as possible.  Feel free to call the clinic you have any questions or concerns. The clinic phone number is [phone].

## 2013-07-23 ENCOUNTER — Telehealth: Payer: Self-pay | Admitting: Medical Oncology

## 2013-07-23 ENCOUNTER — Other Ambulatory Visit (INDEPENDENT_AMBULATORY_CARE_PROVIDER_SITE_OTHER): Payer: Self-pay

## 2013-07-23 DIAGNOSIS — C259 Malignant neoplasm of pancreas, unspecified: Secondary | ICD-10-CM

## 2013-07-23 LAB — CANCER ANTIGEN 19-9: CA 19-9: 20.1 U/mL (ref <35.0)

## 2013-07-23 MED ORDER — PANTOPRAZOLE SODIUM 40 MG PO TBEC: 40.0000 mg | DELAYED_RELEASE_TABLET | Freq: Every day | ORAL | Status: DC

## 2013-07-23 NOTE — Telephone Encounter (Signed)
Message copied by Charma Igo on Fri Jul 23, 2013  4:04 PM ------      Message from: Wandalee Ferdinand      Created: Fri Jul 23, 2013  2:43 PM                   ----- Message -----         From: Ladene Artist, MD         Sent: 07/22/2013   3:07 PM           To: Wandalee Ferdinand, RN, Glori Luis, RN, #            Please call patient, Potassium is still low, not related to chemotherapy. Be sure she is taking the kcl.  Will need to see primary MD to discuss, ?d/c hctz, ? diarrhea            Send copy to primary md. ------

## 2013-07-23 NOTE — Telephone Encounter (Signed)
Potassium 3.2 . Pt stated she is taking potassium 20 meq TID. Denies having diarrhea. She stated she is on two BP meds. I told her to f/u with PCP , Dr Manus Gunning. I gave her a list of potassium rich foods. I faxed lab to PCP. Sheila Hurst said she will call PCP on Monday.

## 2013-09-03 ENCOUNTER — Ambulatory Visit (HOSPITAL_BASED_OUTPATIENT_CLINIC_OR_DEPARTMENT_OTHER): Payer: Medicare Other

## 2013-09-03 VITALS — BP 147/85 | HR 57 | Temp 97.5°F

## 2013-09-03 DIAGNOSIS — Z452 Encounter for adjustment and management of vascular access device: Secondary | ICD-10-CM

## 2013-09-03 DIAGNOSIS — C250 Malignant neoplasm of head of pancreas: Secondary | ICD-10-CM

## 2013-09-03 DIAGNOSIS — Z95828 Presence of other vascular implants and grafts: Secondary | ICD-10-CM

## 2013-09-03 MED ORDER — HEPARIN SOD (PORK) LOCK FLUSH 100 UNIT/ML IV SOLN
500.0000 [IU] | Freq: Once | INTRAVENOUS | Status: AC
  Administered 2013-09-03: 500 [IU] via INTRAVENOUS
  Filled 2013-09-03: qty 5

## 2013-09-03 MED ORDER — SODIUM CHLORIDE 0.9 % IJ SOLN
10.0000 mL | INTRAMUSCULAR | Status: DC | PRN
  Administered 2013-09-03: 10 mL via INTRAVENOUS
  Filled 2013-09-03: qty 10

## 2013-09-03 NOTE — Patient Instructions (Signed)
Implanted Port Instructions  An implanted port is a central line that has a round shape and is placed under the skin. It is used for long-term IV (intravenous) access for:  · Medicine.  · Fluids.  · Liquid nutrition, such as TPN (total parenteral nutrition).  · Blood samples.  Ports can be placed:  · In the chest area just below the collarbone (this is the most common place.)  · In the arms.  · In the belly (abdomen) area.  · In the legs.  PARTS OF THE PORT  A port has 2 main parts:  · The reservoir. The reservoir is round, disc-shaped, and will be a small, raised area under your skin.  · The reservoir is the part where a needle is inserted (accessed) to either give medicines or to draw blood.  · The catheter. The catheter is a long, slender tube that extends from the reservoir. The catheter is placed into a large vein.  · Medicine that is inserted into the reservoir goes into the catheter and then into the vein.  INSERTION OF THE PORT  · The port is surgically placed in either an operating room or in a procedural area (interventional radiology).  · Medicine may be given to help you relax during the procedure.  · The skin where the port will be inserted is numbed (local anesthetic).  · 1 or 2 small cuts (incisions) will be made in the skin to insert the port.  · The port can be used after it has been inserted.  INCISION SITE CARE  · The incision site may have small adhesive strips on it. This helps keep the incision site closed. Sometimes, no adhesive strips are placed. Instead of adhesive strips, a special kind of surgical glue is used to keep the incision closed.  · If adhesive strips were placed on the incision sites, do not take them off. They will fall off on their own.  · The incision site may be sore for 1 to 2 days. Pain medicine can help.  · Do not get the incision site wet. Bathe or shower as directed by your caregiver.  · The incision site should heal in 5 to 7 days. A small scar may form after the  incision has healed.  ACCESSING THE PORT  Special steps must be taken to access the port:  · Before the port is accessed, a numbing cream can be placed on the skin. This helps numb the skin over the port site.  · A sterile technique is used to access the port.  · The port is accessed with a needle. Only "non-coring" port needles should be used to access the port. Once the port is accessed, a blood return should be checked. This helps ensure the port is in the vein and is not clogged (clotted).  · If your caregiver believes your port should remain accessed, a clear (transparent) bandage will be placed over the needle site. The bandage and needle will need to be changed every week or as directed by your caregiver.  · Keep the bandage covering the needle clean and dry. Do not get it wet. Follow your caregiver's instructions on how to take a shower or bath when the port is accessed.  · If your port does not need to stay accessed, no bandage is needed over the port.  FLUSHING THE PORT  Flushing the port keeps it from getting clogged. How often the port is flushed depends on:  · If a   constant infusion is running. If a constant infusion is running, the port may not need to be flushed.  · If intermittent medicines are given.  · If the port is not being used.  For intermittent medicines:  · The port will need to be flushed:  · After medicines have been given.  · After blood has been drawn.  · As part of routine maintenance.  · A port is normally flushed with:  · Normal saline.  · Heparin.  · Follow your caregiver's advice on how often, how much, and the type of flush to use on your port.  IMPORTANT PORT INFORMATION  · Tell your caregiver if you are allergic to heparin.  · After your port is placed, you will get a manufacturer's information card. The card has information about your port. Keep this card with you at all times.  · There are many types of ports available. Know what kind of port you have.  · In case of an  emergency, it may be helpful to wear a medical alert bracelet. This can help alert health care workers that you have a port.  · The port can stay in for as long as your caregiver believes it is necessary.  · When it is time for the port to come out, surgery will be done to remove it. The surgery will be similar to how the port was put in.  · If you are in the hospital or clinic:  · Your port will be taken care of and flushed by a nurse.  · If you are at home:  · A home health care nurse may give medicines and take care of the port.  · You or a family member can get special training and directions for giving medicine and taking care of the port at home.  SEEK IMMEDIATE MEDICAL CARE IF:   · Your port does not flush or you are unable to get a blood return.  · New drainage or pus is coming from the incision.  · A bad smell is coming from the incision site.  · You develop swelling or increased redness at the incision site.  · You develop increased swelling or pain at the port site.  · You develop swelling or pain in the surrounding skin near the port.  · You have an oral temperature above 102° F (38.9° C), not controlled by medicine.  MAKE SURE YOU:   · Understand these instructions.  · Will watch your condition.  · Will get help right away if you are not doing well or get worse.  Document Released: 08/19/2005 Document Revised: 11/11/2011 Document Reviewed: 11/10/2008  [employer]® Patient Information ©2014 [employer].

## 2013-10-10 ENCOUNTER — Emergency Department (HOSPITAL_COMMUNITY)
Admission: EM | Admit: 2013-10-10 | Discharge: 2013-10-10 | Disposition: A | Discharge status: Home / Self Care | Payer: Medicare Other | Attending: Emergency Medicine | Admitting: Emergency Medicine

## 2013-10-10 ENCOUNTER — Encounter (HOSPITAL_COMMUNITY): Payer: Self-pay | Admitting: Emergency Medicine

## 2013-10-10 ENCOUNTER — Emergency Department (HOSPITAL_COMMUNITY): Payer: Medicare Other

## 2013-10-10 DIAGNOSIS — M549 Dorsalgia, unspecified: Secondary | ICD-10-CM | POA: Insufficient documentation

## 2013-10-10 DIAGNOSIS — I10 Essential (primary) hypertension: Secondary | ICD-10-CM | POA: Insufficient documentation

## 2013-10-10 DIAGNOSIS — Z8739 Personal history of other diseases of the musculoskeletal system and connective tissue: Secondary | ICD-10-CM | POA: Insufficient documentation

## 2013-10-10 DIAGNOSIS — R109 Unspecified abdominal pain: Secondary | ICD-10-CM

## 2013-10-10 DIAGNOSIS — Z90710 Acquired absence of both cervix and uterus: Secondary | ICD-10-CM | POA: Insufficient documentation

## 2013-10-10 DIAGNOSIS — R1013 Epigastric pain: Secondary | ICD-10-CM | POA: Insufficient documentation

## 2013-10-10 DIAGNOSIS — Z8509 Personal history of malignant neoplasm of other digestive organs: Secondary | ICD-10-CM | POA: Insufficient documentation

## 2013-10-10 DIAGNOSIS — Z79899 Other long term (current) drug therapy: Secondary | ICD-10-CM | POA: Insufficient documentation

## 2013-10-10 DIAGNOSIS — Z9089 Acquired absence of other organs: Secondary | ICD-10-CM | POA: Insufficient documentation

## 2013-10-10 DIAGNOSIS — K219 Gastro-esophageal reflux disease without esophagitis: Secondary | ICD-10-CM | POA: Insufficient documentation

## 2013-10-10 DIAGNOSIS — Z8709 Personal history of other diseases of the respiratory system: Secondary | ICD-10-CM | POA: Insufficient documentation

## 2013-10-10 DIAGNOSIS — Z98811 Dental restoration status: Secondary | ICD-10-CM | POA: Insufficient documentation

## 2013-10-10 DIAGNOSIS — Z87891 Personal history of nicotine dependence: Secondary | ICD-10-CM | POA: Insufficient documentation

## 2013-10-10 DIAGNOSIS — Z9889 Other specified postprocedural states: Secondary | ICD-10-CM | POA: Insufficient documentation

## 2013-10-10 DIAGNOSIS — Z923 Personal history of irradiation: Secondary | ICD-10-CM | POA: Insufficient documentation

## 2013-10-10 DIAGNOSIS — E785 Hyperlipidemia, unspecified: Secondary | ICD-10-CM | POA: Insufficient documentation

## 2013-10-10 LAB — CBC WITH DIFFERENTIAL/PLATELET
BASOS ABS: 0 10*3/uL (ref 0.0–0.1)
Basophils Relative: 0 % (ref 0–1)
Eosinophils Absolute: 0.1 10*3/uL (ref 0.0–0.7)
Eosinophils Relative: 3 % (ref 0–5)
HCT: 32.7 % — ABNORMAL LOW (ref 36.0–46.0)
HEMOGLOBIN: 10.6 g/dL — AB (ref 12.0–15.0)
LYMPHS PCT: 29 % (ref 12–46)
Lymphs Abs: 1 10*3/uL (ref 0.7–4.0)
MCH: 28.4 pg (ref 26.0–34.0)
MCHC: 32.4 g/dL (ref 30.0–36.0)
MCV: 87.7 fL (ref 78.0–100.0)
Monocytes Absolute: 0.2 10*3/uL (ref 0.1–1.0)
Monocytes Relative: 5 % (ref 3–12)
NEUTROS ABS: 2.2 10*3/uL (ref 1.7–7.7)
Neutrophils Relative %: 63 % (ref 43–77)
Platelets: 182 10*3/uL (ref 150–400)
RBC: 3.73 MIL/uL — ABNORMAL LOW (ref 3.87–5.11)
RDW: 13.9 % (ref 11.5–15.5)
WBC: 3.5 10*3/uL — AB (ref 4.0–10.5)

## 2013-10-10 LAB — URINE MICROSCOPIC-ADD ON

## 2013-10-10 LAB — COMPREHENSIVE METABOLIC PANEL
ALK PHOS: 141 U/L — AB (ref 39–117)
ALT: 15 U/L (ref 0–35)
AST: 22 U/L (ref 0–37)
Albumin: 3.4 g/dL — ABNORMAL LOW (ref 3.5–5.2)
BILIRUBIN TOTAL: 0.5 mg/dL (ref 0.3–1.2)
BUN: 15 mg/dL (ref 6–23)
CHLORIDE: 103 meq/L (ref 96–112)
CO2: 28 mEq/L (ref 19–32)
CREATININE: 0.88 mg/dL (ref 0.50–1.10)
Calcium: 8.9 mg/dL (ref 8.4–10.5)
GFR calc Af Amer: 75 mL/min — ABNORMAL LOW (ref >90)
GFR calc non Af Amer: 65 mL/min — ABNORMAL LOW (ref >90)
Glucose, Bld: 129 mg/dL — ABNORMAL HIGH (ref 70–99)
POTASSIUM: 3.5 meq/L — AB (ref 3.7–5.3)
Sodium: 142 mEq/L (ref 137–147)
Total Protein: 6.7 g/dL (ref 6.0–8.3)

## 2013-10-10 LAB — URINALYSIS, ROUTINE W REFLEX MICROSCOPIC
BILIRUBIN URINE: NEGATIVE
Glucose, UA: NEGATIVE mg/dL
Hgb urine dipstick: NEGATIVE
KETONES UR: NEGATIVE mg/dL
Leukocytes, UA: NEGATIVE
Nitrite: NEGATIVE
PH: 6 (ref 5.0–8.0)
PROTEIN: 30 mg/dL — AB
Specific Gravity, Urine: 1.023 (ref 1.005–1.030)
Urobilinogen, UA: 1 mg/dL (ref 0.0–1.0)

## 2013-10-10 LAB — LIPASE, BLOOD: Lipase: 9 U/L — ABNORMAL LOW (ref 11–59)

## 2013-10-10 MED ORDER — PROMETHAZINE HCL 25 MG/ML IJ SOLN
12.5000 mg | Freq: Once | INTRAMUSCULAR | Status: AC
  Administered 2013-10-10: 12.5 mg via INTRAVENOUS
  Filled 2013-10-10: qty 1

## 2013-10-10 MED ORDER — IOHEXOL 300 MG/ML  SOLN
50.0000 mL | Freq: Once | INTRAMUSCULAR | Status: AC | PRN
  Administered 2013-10-10: 50 mL via ORAL

## 2013-10-10 MED ORDER — SODIUM CHLORIDE 0.9 % IV BOLUS (SEPSIS)
1000.0000 mL | Freq: Once | INTRAVENOUS | Status: AC
  Administered 2013-10-10: 1000 mL via INTRAVENOUS

## 2013-10-10 MED ORDER — IOHEXOL 300 MG/ML  SOLN
100.0000 mL | Freq: Once | INTRAMUSCULAR | Status: AC | PRN
  Administered 2013-10-10: 100 mL via INTRAVENOUS

## 2013-10-10 MED ORDER — MORPHINE SULFATE 4 MG/ML IJ SOLN: 4.0000 mg | Freq: Once | INTRAMUSCULAR | Status: DC

## 2013-10-10 NOTE — ED Provider Notes (Signed)
Medical screening examination/treatment/procedure(s) were conducted as a shared visit with non-physician practitioner(s) and myself.  I personally evaluated the patient during the encounter.  EKG Interpretation   None        Leota Jacobsen, MD 10/10/13 2330

## 2013-10-10 NOTE — ED Provider Notes (Signed)
Medical screening examination/treatment/procedure(s) were conducted as a shared visit with non-physician practitioner(s) and myself.  I personally evaluated the patient during the encounter.  EKG Interpretation   None      Patient here with abdominal and back pain began this evening which is since improved. No fever or chills no vomiting. Abdominal CT was negative for acute process. She has no signs of surgical abdomen at this time. She is stable for discharge  Leota Jacobsen, MD 10/10/13 2316

## 2013-10-10 NOTE — ED Provider Notes (Signed)
CSN: [id number]     Arrival date & time 10/10/13  1833 History   First MD Initiated Contact with Patient 10/10/13 1851     No chief complaint on file.  (Consider location/radiation/quality/duration/timing/severity/associated sxs/prior Treatment) HPI Comments: Patient is a 73 year old female with a past medical history of pancreatic adenocarcinoma now in remission, hypertension, and hyperlipidemia who presents with abdominal pain for the past 3 days. The pain is located in her epigastrium and radiates around to her bilateral flanks and to her back. The pain is described as aching and severe. The pain started gradually and progressively worsened since the onset. No alleviating/aggravating factors. The patient has tried nothing for symptoms without relief. Associated symptoms include nothing. Patient denies fever, headache, NVD, chest pain, SOB, dysuria, constipation.    Past Medical History  Diagnosis Date  . Hypertension   . GERD (gastroesophageal reflux disease)     no medication  . Arthritis     left knee  . Hyperlipidemia   . Pancreatic mass 09/15/2012    Head of Pancreas, malignant cells consistent w/adenocarcinoma  . Weight loss     Prior to Dx  . Bronchitis   . Radiation 10/19/12-11/30/12    50.4 gray Pancreatic Ca/abdomen  . Wears glasses   . Wears dentures     top   Past Surgical History  Procedure Laterality Date  . Badder tack  09-2011  . Abdominal hysterectomy  1993    total  . Tonsillectomy      age 88  . Appendectomy  70's  . Tubal ligation  1977  . Eus  09/15/2012    Procedure: ESOPHAGEAL ENDOSCOPIC ULTRASOUND (EUS) RADIAL;  Surgeon: Arta Silence, MD;  Location: [facility];  Service: Endoscopy;  Laterality: N/A;  + or - fna unsure of what scope  . Fine needle aspiration  09/15/2012    Procedure: FINE NEEDLE ASPIRATION (FNA) LINEAR;  Surgeon: Arta Silence, MD;  Location: [facility];  Service: Endoscopy;  Laterality: N/A;  . Belpharoptosis repair    . Laparoscopy  N/A 01/28/2013    Procedure: LAPAROSCOPY DIAGNOSTIC,  WHIPPLE;  Surgeon: Stark Klein, MD;  Location: [facility];  Service: General;  Laterality: N/A;  . Whipple procedure N/A 01/28/2013    Procedure: WHIPPLE PROCEDURE;  Surgeon: Stark Klein, MD;  Location: [facility];  Service: General;  Laterality: N/A;  . Portacath placement N/A 03/18/2013    Procedure: INSERTION PORT-A-CATH;  Surgeon: Stark Klein, MD;  Location: [facility];  Service: General;  Laterality: N/A;  Flouro time 64minute, 11 seconds.   Family History  Problem Relation Age of Onset  . Throat cancer Father     died age 90  . Pancreatic cancer Paternal Aunt    History  Substance Use Topics  . Smoking status: Former Smoker -- 0.30 packs/day for 24 years    Types: Cigarettes    Quit date: 09/02/1978  . Smokeless tobacco: Never Used  . Alcohol Use: No   OB History   Grav Para Term Preterm Abortions TAB SAB Ect Mult Living                 Review of Systems  Constitutional: Negative for fever, chills and fatigue.  HENT: Negative for trouble swallowing.   Eyes: Negative for visual disturbance.  Respiratory: Negative for shortness of breath.   Cardiovascular: Negative for chest pain and palpitations.  Gastrointestinal: Negative for nausea, vomiting, abdominal pain and diarrhea.  Genitourinary: Negative for dysuria and difficulty urinating.  Musculoskeletal:  Negative for arthralgias and neck pain.  Skin: Negative for color change.  Neurological: Negative for dizziness and weakness.  Psychiatric/Behavioral: Negative for dysphoric mood.    Allergies  Ondansetron  Home Medications   Current Outpatient Rx  Name  Route  Sig  Dispense  Refill  . diltiazem (CARDIZEM) 90 MG tablet   Oral   Take 90 mg by mouth every morning.          . Glucosamine-Chondroit-Vit C-Mn (GLUCOSAMINE 1500 COMPLEX PO)   Oral   Take 1 capsule by mouth daily.         Marland Kitchen lidocaine-prilocaine (EMLA) cream   Topical   Apply 1  application topically as needed (port access).         . metoCLOPramide (REGLAN) 10 MG tablet   Oral   Take 10 mg by mouth every morning.          . Pancrelipase, Lip-Prot-Amyl, 24000 UNITS CPEP   Oral   Take 1 capsule by mouth 3 (three) times daily.         . pantoprazole (PROTONIX) 40 MG tablet   Oral   Take 1 tablet (40 mg total) by mouth daily.   30 tablet   2   . potassium chloride SA (K-DUR,KLOR-CON) 20 MEQ tablet   Oral   Take 1 tablet (20 mEq total) by mouth 3 (three) times daily.   90 tablet   1   . triamterene-hydrochlorothiazide (MAXZIDE-25) 37.5-25 MG per tablet   Oral   Take 1 tablet by mouth every morning.          . valsartan (DIOVAN) 160 MG tablet   Oral   Take 1 tablet by mouth every morning.          BP 182/92  Pulse 57  Temp(Src) 98.7 F (37.1 C) (Oral)  Resp 18  SpO2 96% Physical Exam  Nursing note and vitals reviewed. Constitutional: She is oriented to person, place, and time. She appears well-developed and well-nourished. No distress.  HENT:  Head: Normocephalic and atraumatic.  Eyes: Conjunctivae and EOM are normal.  Neck: Normal range of motion.  Cardiovascular: Normal rate and regular rhythm.  Exam reveals no gallop and no friction rub.   No murmur heard. Pulmonary/Chest: Effort normal and breath sounds normal. She has no wheezes. She has no rales. She exhibits no tenderness.  Abdominal: Soft. She exhibits no distension. There is tenderness. There is no rebound and no guarding.  Epigastric abdominal tenderness to palpation. No other focal tenderness or peritoneal signs.   Musculoskeletal: Normal range of motion.  Neurological: She is alert and oriented to person, place, and time. Coordination normal.  Speech is goal-oriented. Moves limbs without ataxia.   Skin: Skin is warm and dry.  Psychiatric: She has a normal mood and affect. Her behavior is normal.    ED Course  Procedures (including critical care time) Labs  Review Labs Reviewed  CBC WITH DIFFERENTIAL - Abnormal; Notable for the following:    WBC 3.5 (*)    RBC 3.73 (*)    Hemoglobin 10.6 (*)    HCT 32.7 (*)    All other components within normal limits  COMPREHENSIVE METABOLIC PANEL - Abnormal; Notable for the following:    Potassium 3.5 (*)    Glucose, Bld 129 (*)    Albumin 3.4 (*)    Alkaline Phosphatase 141 (*)    GFR calc non Af Amer 65 (*)    GFR calc Af Amer 75 (*)  All other components within normal limits  LIPASE, BLOOD - Abnormal; Notable for the following:    Lipase 9 (*)    All other components within normal limits  URINALYSIS, ROUTINE W REFLEX MICROSCOPIC - Abnormal; Notable for the following:    Protein, ur 30 (*)    All other components within normal limits  URINE MICROSCOPIC-ADD ON - Abnormal; Notable for the following:    Crystals CA OXALATE CRYSTALS (*)    All other components within normal limits   Imaging Review Ct Abdomen Pelvis W Contrast  10/10/2013   CLINICAL DATA:  Abdomen pain. History of pancreatic cancer recently finished chemotherapy in December 2014.  EXAM: CT ABDOMEN AND PELVIS WITH CONTRAST  TECHNIQUE: Multidetector CT imaging of the abdomen and pelvis was performed using the standard protocol following bolus administration of intravenous contrast.  CONTRAST:  169mL OMNIPAQUE IOHEXOL 300 MG/ML  SOLN  COMPARISON:  Jan 13, 2013  FINDINGS: There is a 4.6 x 4.6 cm cyst in the right lobe liver unchanged. No other focal liver lesions identified. There is minimal central intrahepatic biliary air in left lobe liver. Patient is status post prior surgery of the pancreatic head. There is dilatation of the pancreatic duct unchanged compared to prior exam. The gallbladder is not seen. The spleen, adrenal glands and left kidney are normal. There is a 0.8 cm cyst in the anterior lower pole right kidney unchanged. There is no hydronephrosis bilaterally. There is atherosclerosis of the abdominal aorta without aneurysmal  dilatation. There is no abdominal lymphadenopathy. There is no small bowel obstruction or diverticulitis. The patient is status post prior appendectomy. There is a small focal anterior bulging/herniation of colon at the umbilicus.  Fluid-filled bladder is normal. The uterus is not seen. Mild dependent atelectasis of the posterior lung bases are identified. Degenerative joint changes of the spine are noted. Coarse trabecular pattern is identified throughout the right hemipelvis with cortical thickening consistent with Paget's involvement without change. No focal discrete lytic or blastic lesions are identified.  IMPRESSION: No acute abnormality identified in the abdomen and pelvis. Postsurgical changes at the pancreas. Stable liver cyst.   Electronically Signed   By: Abelardo Diesel M.D.   On: 10/10/2013 22:14    EKG Interpretation   None       MDM   1. Abdominal pain     9:37 PM Labs and urinalysis unremarkable for acute changes. Patient given phenergan and morphine for symptoms. CT abdomen pelvis pending. Vitals stable and patient afebrile.   10:20 PM Patient's CT unremarkable for acute changes. Patient reports improvement in her pain. Patient advised to follow up with her doctor tomorrow. Vitals stable and patient afebrile. I doubt acute infection or acute abdomen at this time. Patient will return to the ED with worsening or concerning symptoms.   Alvina Chou, [place] 10/10/13 [phone]

## 2013-10-10 NOTE — Discharge Instructions (Signed)
Follow up with your doctor tomorrow. Refer to attached documents for more information. Return to the ED with worsening or concerning symptoms.

## 2013-10-10 NOTE — ED Notes (Signed)
Pt from home c/o abdominal pain and back pain starting on Thursday. Patient denies unusual bowel patterns and denies nausea. Sent here from Clinic at [facility]. Pt has HX of Pancreatic CA and is was receiving chemo in which she finished that in Dec this 2014.

## 2013-10-22 ENCOUNTER — Ambulatory Visit (HOSPITAL_BASED_OUTPATIENT_CLINIC_OR_DEPARTMENT_OTHER): Payer: Medicare Other | Admitting: Oncology

## 2013-10-22 ENCOUNTER — Telehealth: Payer: Self-pay | Admitting: Oncology

## 2013-10-22 ENCOUNTER — Ambulatory Visit (HOSPITAL_BASED_OUTPATIENT_CLINIC_OR_DEPARTMENT_OTHER): Payer: Medicare Other

## 2013-10-22 ENCOUNTER — Other Ambulatory Visit: Payer: Medicare Other

## 2013-10-22 VITALS — BP 175/75 | HR 56 | Temp 97.5°F

## 2013-10-22 VITALS — BP 179/71 | HR 55 | Temp 97.4°F | Resp 17 | Ht 64.0 in | Wt 147.1 lb

## 2013-10-22 DIAGNOSIS — C250 Malignant neoplasm of head of pancreas: Secondary | ICD-10-CM

## 2013-10-22 DIAGNOSIS — C259 Malignant neoplasm of pancreas, unspecified: Secondary | ICD-10-CM

## 2013-10-22 DIAGNOSIS — E876 Hypokalemia: Secondary | ICD-10-CM

## 2013-10-22 DIAGNOSIS — I10 Essential (primary) hypertension: Secondary | ICD-10-CM

## 2013-10-22 DIAGNOSIS — G893 Neoplasm related pain (acute) (chronic): Secondary | ICD-10-CM

## 2013-10-22 DIAGNOSIS — Z86718 Personal history of other venous thrombosis and embolism: Secondary | ICD-10-CM

## 2013-10-22 DIAGNOSIS — Z95828 Presence of other vascular implants and grafts: Secondary | ICD-10-CM

## 2013-10-22 LAB — CANCER ANTIGEN 19-9: CA 19 9: 18.4 U/mL (ref <35.0)

## 2013-10-22 MED ORDER — SODIUM CHLORIDE 0.9 % IJ SOLN
10.0000 mL | INTRAMUSCULAR | Status: DC | PRN
  Administered 2013-10-22: 10 mL via INTRAVENOUS
  Filled 2013-10-22: qty 10

## 2013-10-22 MED ORDER — HEPARIN SOD (PORK) LOCK FLUSH 100 UNIT/ML IV SOLN
500.0000 [IU] | Freq: Once | INTRAVENOUS | Status: AC
  Administered 2013-10-22: 500 [IU] via INTRAVENOUS
  Filled 2013-10-22: qty 5

## 2013-10-22 NOTE — Progress Notes (Signed)
   [place]    OFFICE PROGRESS NOTE   INTERVAL HISTORY:   She returns for scheduled followup with pancreas cancer. Good appetite. No diarrhea. She had abdomen and back pain several weeks ago and was evaluated in the emergency room. A CT of the abdomen and pelvis revealed no acute abnormality. The pain has resolved.  Objective:  Vital signs in last 24 hours:  Blood pressure 179/71, pulse 55, temperature 97.4 F (36.3 C), temperature source Oral, resp. rate 17, height 5\' 4"  (1.626 m), weight 147 lb 1.6 oz (66.724 kg), SpO2 98.00%.    HEENT: Neck without mass Lymphatics: No cervical, supraclavicular, axillary, or inguinal nodes Resp: Lungs clear bilaterally Cardio: Regular rate and rhythm GI: No hepatomegaly, nontender, no mass Vascular: No leg edema  Portacath/PICC-without erythema  Lab Results:  10/10/2013-potassium 3.5   Medications: I have reviewed the patient's current medications.  Assessment/Plan: 1. Adenocarcinoma of the pancreas; mass in the neck of the pancreas, status post EUS guided biopsy 09/15/2012 with the cytology confirming malignant cells consistent with adenocarcinoma, T3 N0 by ultrasound. 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 Elevated CA 19-9. She began radiation and concurrent Xeloda chemotherapy on 10/19/2012, radiation completed 11/30/2012 -Whipple procedure 01/28/2013 with no residual carcinoma seen and stromal fibrosis consistent with post treatment effect.  -She began adjuvant gemcitabine 04/01/2013, completed 07/22/2013 2. Pain secondary to pancreas cancer. Improved. 3. Right liver cyst. Smaller on MRI 09/28/2012 compared to 11/03/2008. 4. Hypertension currently taking Cardizem 5. Hyperlipidemia. 6. Family history of pancreas cancer (paternal aunt). 7. Anorexia/weight loss. Weight has increased 8. Bradycardia when here on 11/11/2012. Atenolol was placed on  hold. 9. Right leg DVT documented on a Doppler 11/26/2012-no longer on anticoagulation 10. Hypokalemia. She is still taking a potassium supplement.  Disposition:  Ms. Ramdass remains in clinical remission from pancreas cancer. She would like to keep the Port-A-Cath in place. She will discuss removal of the Port-A-Cath with Dr. Barry Dienes. We will check a potassium level when she returns for a Port-A-Cath flush in 6 weeks and consider decreasing the potassium supplement. She will discontinue Reglan. She may not require potassium since she is no longer taking a diuretic.  Ms. Outten will return for an office visit in 3 months. We will followup on the CA 19-9 from today.   Betsy Coder, MD  10/22/2013  3:42 PM

## 2013-10-22 NOTE — Telephone Encounter (Signed)
Gave pt appt for flush and MD for April and MAy 2015

## 2013-10-25 ENCOUNTER — Telehealth: Payer: Self-pay | Admitting: *Deleted

## 2013-10-25 NOTE — Telephone Encounter (Signed)
Called pt with lab results. CA 19-9 is normal. She voiced understanding.

## 2013-10-25 NOTE — Telephone Encounter (Signed)
Message copied by Brien Few on Mon Oct 25, 2013 10:56 AM ------      Message from: Ladell Pier      Created: Fri Oct 22, 2013  5:29 PM       Please call patient, ca19-9 is normal ------

## 2013-11-12 ENCOUNTER — Other Ambulatory Visit (INDEPENDENT_AMBULATORY_CARE_PROVIDER_SITE_OTHER): Payer: Self-pay | Admitting: General Surgery

## 2013-11-15 ENCOUNTER — Ambulatory Visit (INDEPENDENT_AMBULATORY_CARE_PROVIDER_SITE_OTHER): Payer: Medicare Other | Admitting: General Surgery

## 2013-11-15 DIAGNOSIS — K8681 Exocrine pancreatic insufficiency: Secondary | ICD-10-CM

## 2013-11-15 DIAGNOSIS — C259 Malignant neoplasm of pancreas, unspecified: Secondary | ICD-10-CM

## 2013-11-15 DIAGNOSIS — K8689 Other specified diseases of pancreas: Secondary | ICD-10-CM

## 2013-11-15 DIAGNOSIS — K219 Gastro-esophageal reflux disease without esophagitis: Secondary | ICD-10-CM

## 2013-11-15 MED ORDER — PANTOPRAZOLE SODIUM 40 MG PO TBEC: 40.0000 mg | DELAYED_RELEASE_TABLET | Freq: Every day | ORAL | Status: DC

## 2013-11-15 MED ORDER — PANCRELIPASE (LIP-PROT-AMYL) 24000-76000 UNITS PO CPEP: ORAL_CAPSULE | ORAL | Status: DC

## 2013-11-15 NOTE — Patient Instructions (Signed)
We will refill protonix and creon.  Go up on creon to 4 caps with meals and 1 with snacks.

## 2013-11-18 NOTE — Assessment & Plan Note (Addendum)
Increase creon to help with steatorrhea, bloating, and crampy discomfort.

## 2013-11-18 NOTE — Progress Notes (Signed)
HISTORY: Patient is approximately 10 months status post pancreaticoduodenectomy for cancer of the pancreatic head.  Pt continues to have some loose stools, but these are improved since last visit when she started on Creon.  She denies abdominal pain.  She is not having any more issues with weight loss or appetite.  She has stopped megace. She had recent CT scan that was negative for any evidence of recurrence.     PERTINENT REVIEW OF SYSTEMS: Otherwise negative x 11.     Wt Readings from Last 3 Encounters:  10/22/13 147 lb 1.6 oz (66.724 kg)  07/22/13 141 lb 14.4 oz (64.365 kg)  06/24/13 145 lb 3.2 oz (65.862 kg)   Temp Readings from Last 3 Encounters:  10/22/13 97.5 F (36.4 C) Oral  10/22/13 97.4 F (36.3 C) Oral  10/10/13 98.1 F (36.7 C) Oral   BP Readings from Last 3 Encounters:  10/22/13 175/75  10/22/13 179/71  10/10/13 168/62   Pulse Readings from Last 3 Encounters:  10/22/13 56  10/22/13 55  10/10/13 58     EXAM: Head: Normocephalic and atraumatic.  Eyes:  Conjunctivae are normal. Pupils are equal, round, and reactive to light. No scleral icterus.  Neck:  Normal range of motion. Neck supple. No tracheal deviation present. No thyromegaly present.  Resp: No respiratory distress, normal effort. Abd:  Abdomen is soft, non distended.  Her incision is tender. No masses are palpable.  There is no rebound and no guarding.  Neurological: Alert and oriented to person, place, and time. Coordination normal.  Skin: Skin is warm and dry. No rash noted. No diaphoretic. No erythema. No pallor.  Psychiatric: Normal mood and affect. Normal behavior. Judgment and thought content normal.      ASSESSMENT AND PLAN:   Exocrine pancreatic insufficiency Increase creon to help with steatorrhea, bloating, and crampy discomfort.       Milus Height, MD Surgical Oncology, [facility]  Simona Huh, MD Gaynelle Arabian,  MD

## 2013-11-18 NOTE — Assessment & Plan Note (Signed)
No clinical evidence of disease.    Pt is up to date on recommended survivorship.    Follow up with me in 6 months.

## 2013-12-03 ENCOUNTER — Other Ambulatory Visit (HOSPITAL_BASED_OUTPATIENT_CLINIC_OR_DEPARTMENT_OTHER): Payer: Medicare Other

## 2013-12-03 ENCOUNTER — Telehealth: Payer: Self-pay | Admitting: *Deleted

## 2013-12-03 ENCOUNTER — Ambulatory Visit (HOSPITAL_BASED_OUTPATIENT_CLINIC_OR_DEPARTMENT_OTHER): Payer: Medicare Other

## 2013-12-03 VITALS — BP 181/78 | HR 67 | Temp 104.0°F

## 2013-12-03 DIAGNOSIS — C250 Malignant neoplasm of head of pancreas: Secondary | ICD-10-CM

## 2013-12-03 DIAGNOSIS — C259 Malignant neoplasm of pancreas, unspecified: Secondary | ICD-10-CM

## 2013-12-03 DIAGNOSIS — Z452 Encounter for adjustment and management of vascular access device: Secondary | ICD-10-CM

## 2013-12-03 LAB — BASIC METABOLIC PANEL (CC13)
Anion Gap: 10 mEq/L (ref 3–11)
BUN: 8.4 mg/dL (ref 7.0–26.0)
CALCIUM: 9.2 mg/dL (ref 8.4–10.4)
CO2: 27 mEq/L (ref 22–29)
Chloride: 106 mEq/L (ref 98–109)
Creatinine: 0.7 mg/dL (ref 0.6–1.1)
Glucose: 98 mg/dl (ref 70–140)
POTASSIUM: 3.6 meq/L (ref 3.5–5.1)
SODIUM: 143 meq/L (ref 136–145)

## 2013-12-03 MED ORDER — SODIUM CHLORIDE 0.9 % IJ SOLN
10.0000 mL | INTRAMUSCULAR | Status: DC | PRN
  Administered 2013-12-03: 10 mL via INTRAVENOUS
  Filled 2013-12-03: qty 10

## 2013-12-03 MED ORDER — HEPARIN SOD (PORK) LOCK FLUSH 100 UNIT/ML IV SOLN
500.0000 [IU] | Freq: Once | INTRAVENOUS | Status: AC
  Administered 2013-12-03: 500 [IU] via INTRAVENOUS
  Filled 2013-12-03: qty 5

## 2013-12-03 NOTE — Patient Instructions (Signed)

## 2013-12-03 NOTE — Telephone Encounter (Signed)
Called and informed patient of normal potassium and to continue taking potassium.  Per Dr. Benay Spice.  Patient verbalized understanding.

## 2013-12-03 NOTE — Telephone Encounter (Signed)
Message copied by Norma Fredrickson on Fri Dec 03, 2013  4:42 PM ------      Message from: Ladell Pier      Created: Fri Dec 03, 2013  4:35 PM       Please call patient, potassium is normal, continue potassium ------

## 2013-12-22 ENCOUNTER — Other Ambulatory Visit: Payer: Self-pay | Admitting: Family Medicine

## 2013-12-22 ENCOUNTER — Ambulatory Visit
Admission: RE | Admit: 2013-12-22 | Discharge: 2013-12-22 | Disposition: A | Discharge status: Home / Self Care | Payer: Medicare Other | Source: Ambulatory Visit | Attending: Family Medicine | Admitting: Family Medicine

## 2013-12-22 DIAGNOSIS — M25512 Pain in left shoulder: Secondary | ICD-10-CM

## 2013-12-22 DIAGNOSIS — Z8507 Personal history of malignant neoplasm of pancreas: Secondary | ICD-10-CM

## 2014-01-20 ENCOUNTER — Ambulatory Visit (HOSPITAL_BASED_OUTPATIENT_CLINIC_OR_DEPARTMENT_OTHER): Payer: Medicare Other

## 2014-01-20 ENCOUNTER — Other Ambulatory Visit (HOSPITAL_BASED_OUTPATIENT_CLINIC_OR_DEPARTMENT_OTHER): Payer: Medicare Other

## 2014-01-20 ENCOUNTER — Telehealth: Payer: Self-pay | Admitting: Oncology

## 2014-01-20 ENCOUNTER — Ambulatory Visit (HOSPITAL_BASED_OUTPATIENT_CLINIC_OR_DEPARTMENT_OTHER): Payer: Medicare Other | Admitting: Nurse Practitioner

## 2014-01-20 VITALS — BP 186/90 | HR 53 | Temp 97.9°F | Resp 20 | Ht 64.0 in | Wt 151.6 lb

## 2014-01-20 DIAGNOSIS — Z95828 Presence of other vascular implants and grafts: Secondary | ICD-10-CM

## 2014-01-20 DIAGNOSIS — E876 Hypokalemia: Secondary | ICD-10-CM

## 2014-01-20 DIAGNOSIS — Z800 Family history of malignant neoplasm of digestive organs: Secondary | ICD-10-CM

## 2014-01-20 DIAGNOSIS — C259 Malignant neoplasm of pancreas, unspecified: Secondary | ICD-10-CM

## 2014-01-20 DIAGNOSIS — C250 Malignant neoplasm of head of pancreas: Secondary | ICD-10-CM

## 2014-01-20 DIAGNOSIS — I10 Essential (primary) hypertension: Secondary | ICD-10-CM

## 2014-01-20 DIAGNOSIS — K7689 Other specified diseases of liver: Secondary | ICD-10-CM

## 2014-01-20 LAB — BASIC METABOLIC PANEL (CC13)
Anion Gap: 10 mEq/L (ref 3–11)
BUN: 11 mg/dL (ref 7.0–26.0)
CHLORIDE: 107 meq/L (ref 98–109)
CO2: 27 mEq/L (ref 22–29)
Calcium: 9.1 mg/dL (ref 8.4–10.4)
Creatinine: 0.7 mg/dL (ref 0.6–1.1)
Glucose: 90 mg/dl (ref 70–140)
POTASSIUM: 3.4 meq/L — AB (ref 3.5–5.1)
Sodium: 143 mEq/L (ref 136–145)

## 2014-01-20 LAB — CANCER ANTIGEN 19-9: CA 19 9: 16.6 U/mL (ref <35.0)

## 2014-01-20 MED ORDER — HEPARIN SOD (PORK) LOCK FLUSH 100 UNIT/ML IV SOLN
500.0000 [IU] | Freq: Once | INTRAVENOUS | Status: AC
  Administered 2014-01-20: 500 [IU] via INTRAVENOUS
  Filled 2014-01-20: qty 5

## 2014-01-20 MED ORDER — SODIUM CHLORIDE 0.9 % IJ SOLN
10.0000 mL | INTRAMUSCULAR | Status: DC | PRN
  Administered 2014-01-20: 10 mL via INTRAVENOUS
  Filled 2014-01-20: qty 10

## 2014-01-20 NOTE — Progress Notes (Addendum)
  [place] OFFICE PROGRESS NOTE   Diagnosis:  Pancreas cancer.  INTERVAL HISTORY:   Sheila Hurst returns for scheduled followup. She overall feels well. No pain except related to "arthritis" in the shoulders. No abdominal pain. No diarrhea. She continues pancreatic enzyme replacement. She has 1 or 2 stools a day. Energy level is good. She has a good appetite. She is gaining weight. She denies shortness of breath. No hematuria or dysuria.  She reports blood pressure medications are in the process of being adjusted. She has not had her blood pressure medication yet today.  Objective:  Vital signs in last 24 hours:  Blood pressure 186/90, pulse 53, temperature 97.9 F (36.6 C), temperature source Oral, resp. rate 20, height 5\' 4"  (1.626 m), weight 151 lb 9.6 oz (68.765 kg), SpO2 100.00%.    HEENT: No thrush or ulcerations. Lymphatics: No palpable cervical, supraclavicular or axillary lymph nodes. Resp: Lungs clear. Cardio: Regular cardiac rhythm. GI: Abdomen soft and nontender. No hepatomegaly. No mass. Just inferior to the upper radiation tattoo there is a firm area, likely scar tissue. Vascular: No leg edema. Calves soft and nontender.  Port-A-Cath site without erythema.   Lab Results:  Lab Results  Component Value Date   WBC 3.5* 10/10/2013   HGB 10.6* 10/10/2013   HCT 32.7* 10/10/2013   MCV 87.7 10/10/2013   PLT 182 10/10/2013   NEUTROABS 2.2 10/10/2013    Imaging:  No results found.  Medications: I have reviewed the patient's current medications.  Assessment/Plan: 1. Adenocarcinoma of the pancreas; mass in the neck of the pancreas, status post EUS guided biopsy 09/15/2012 with the cytology confirming malignant cells consistent with adenocarcinoma, T3 N0 by ultrasound. 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 Elevated CA 19-9. She began radiation and concurrent Xeloda chemotherapy on  10/19/2012, radiation completed 11/30/2012 -Whipple procedure 01/28/2013 with no residual carcinoma seen and stromal fibrosis consistent with post treatment effect.  -She began adjuvant gemcitabine 04/01/2013, completed 07/22/2013.  2. Pain secondary to pancreas cancer. Improved. 3. Right liver cyst. Smaller on MRI 09/28/2012 compared to 11/03/2008. 4. Hypertension currently taking Cardizem 5. Hyperlipidemia. 6. Family history of pancreas cancer (paternal aunt). 7. Anorexia/weight loss. Resolved. 8. Bradycardia when here on 11/11/2012. Atenolol was placed on hold. 9. Right leg DVT documented on a Doppler 11/26/2012-no longer on anticoagulation. 10. Hypokalemia. She is still taking a potassium supplement. 11. CT abdomen/pelvis 10/10/2013 done to evaluate abdominal pain. No acute abnormality identified in the abdomen and pelvis. Postsurgical changes at the pancreas. Stable liver cyst.   Disposition: Sheila Hurst appears stable. She remains in clinical remission from pancreas cancer. We will followup on the CA 19-9 from today. She would like to keep her Port-A-Cath for now. She will return for a port flush in 6 weeks. We will see her in followup in 3 months. She will contact the office in the interim with any problems.  Patient seen with Dr. Benay Spice.    [name] ANP/GNP-BC   01/20/2014  10:26 AM  This was a shared visit with Ned Card. Sheila Hurst was examined. The nodular fullness at the upper portion of the midline scar is likely scar tissue.  Sheila Hurst, M.D.

## 2014-01-20 NOTE — Progress Notes (Signed)
Patient in for Port-A-Cath flush and labs today. Port accessed and flushed without difficulty. No blood return noted with flush. All labs drawn by Phlebotomist today.

## 2014-01-20 NOTE — Patient Instructions (Signed)

## 2014-01-20 NOTE — Telephone Encounter (Signed)
gv pt appt schedule for july and aug

## 2014-01-25 ENCOUNTER — Telehealth: Payer: Self-pay | Admitting: *Deleted

## 2014-01-25 NOTE — Telephone Encounter (Signed)
Message copied by Brien Few on Tue Jan 25, 2014  3:01 PM ------      Message from: Betsy Coder B      Created: Thu Jan 20, 2014  8:35 PM       Please call patient, ca19-9 is normal, be sure she is taking kcl, f/u as scheduled ------

## 2014-01-25 NOTE — Telephone Encounter (Signed)
Called pt with lab results. Tumor marker is normal, potassium is slightly low. Pt reports her primary doctor discontinued Potassium. Copy faxed to Dr. Marisue Humble. Pt instructed to follow up with PCP re: potassium replacement. She voiced understanding.

## 2014-03-03 ENCOUNTER — Other Ambulatory Visit (INDEPENDENT_AMBULATORY_CARE_PROVIDER_SITE_OTHER): Payer: Self-pay | Admitting: General Surgery

## 2014-03-03 ENCOUNTER — Ambulatory Visit (HOSPITAL_BASED_OUTPATIENT_CLINIC_OR_DEPARTMENT_OTHER): Payer: Medicare Other

## 2014-03-03 VITALS — BP 167/80 | Temp 97.8°F | Resp 16

## 2014-03-03 DIAGNOSIS — C250 Malignant neoplasm of head of pancreas: Secondary | ICD-10-CM

## 2014-03-03 DIAGNOSIS — Z95828 Presence of other vascular implants and grafts: Secondary | ICD-10-CM

## 2014-03-03 DIAGNOSIS — Z452 Encounter for adjustment and management of vascular access device: Secondary | ICD-10-CM

## 2014-03-03 MED ORDER — HEPARIN SOD (PORK) LOCK FLUSH 100 UNIT/ML IV SOLN
500.0000 [IU] | Freq: Once | INTRAVENOUS | Status: AC
  Administered 2014-03-03: 500 [IU] via INTRAVENOUS
  Filled 2014-03-03: qty 5

## 2014-03-03 MED ORDER — SODIUM CHLORIDE 0.9 % IJ SOLN
10.0000 mL | INTRAMUSCULAR | Status: DC | PRN
  Administered 2014-03-03: 10 mL via INTRAVENOUS
  Filled 2014-03-03: qty 10

## 2014-03-03 NOTE — Patient Instructions (Signed)

## 2014-03-22 ENCOUNTER — Other Ambulatory Visit (INDEPENDENT_AMBULATORY_CARE_PROVIDER_SITE_OTHER): Payer: Self-pay

## 2014-03-22 DIAGNOSIS — K219 Gastro-esophageal reflux disease without esophagitis: Secondary | ICD-10-CM

## 2014-03-22 MED ORDER — PANTOPRAZOLE SODIUM 40 MG PO TBEC
40.0000 mg | DELAYED_RELEASE_TABLET | Freq: Every day | ORAL | Status: AC
Start: 2014-03-22 — End: ?

## 2014-04-25 ENCOUNTER — Ambulatory Visit (HOSPITAL_BASED_OUTPATIENT_CLINIC_OR_DEPARTMENT_OTHER): Payer: Medicare Other | Admitting: Oncology

## 2014-04-25 ENCOUNTER — Other Ambulatory Visit (HOSPITAL_BASED_OUTPATIENT_CLINIC_OR_DEPARTMENT_OTHER): Payer: Medicare Other

## 2014-04-25 ENCOUNTER — Ambulatory Visit (HOSPITAL_BASED_OUTPATIENT_CLINIC_OR_DEPARTMENT_OTHER): Payer: Medicare Other

## 2014-04-25 ENCOUNTER — Telehealth: Payer: Self-pay | Admitting: Oncology

## 2014-04-25 VITALS — BP 144/61 | HR 59 | Temp 98.0°F | Resp 20 | Ht 64.0 in | Wt 143.9 lb

## 2014-04-25 DIAGNOSIS — C250 Malignant neoplasm of head of pancreas: Secondary | ICD-10-CM

## 2014-04-25 DIAGNOSIS — E876 Hypokalemia: Secondary | ICD-10-CM

## 2014-04-25 DIAGNOSIS — C259 Malignant neoplasm of pancreas, unspecified: Secondary | ICD-10-CM

## 2014-04-25 DIAGNOSIS — Z95828 Presence of other vascular implants and grafts: Secondary | ICD-10-CM

## 2014-04-25 DIAGNOSIS — G893 Neoplasm related pain (acute) (chronic): Secondary | ICD-10-CM

## 2014-04-25 LAB — BASIC METABOLIC PANEL (CC13)
ANION GAP: 10 meq/L (ref 3–11)
BUN: 13.9 mg/dL (ref 7.0–26.0)
CALCIUM: 9 mg/dL (ref 8.4–10.4)
CO2: 27 meq/L (ref 22–29)
Chloride: 105 mEq/L (ref 98–109)
Creatinine: 0.9 mg/dL (ref 0.6–1.1)
GLUCOSE: 167 mg/dL — AB (ref 70–140)
POTASSIUM: 3.5 meq/L (ref 3.5–5.1)
Sodium: 142 mEq/L (ref 136–145)

## 2014-04-25 MED ORDER — SODIUM CHLORIDE 0.9 % IJ SOLN
10.0000 mL | INTRAMUSCULAR | Status: DC | PRN
  Administered 2014-04-25: 10 mL via INTRAVENOUS
  Filled 2014-04-25: qty 10

## 2014-04-25 MED ORDER — HEPARIN SOD (PORK) LOCK FLUSH 100 UNIT/ML IV SOLN
500.0000 [IU] | Freq: Once | INTRAVENOUS | Status: AC
  Administered 2014-04-25: 500 [IU] via INTRAVENOUS
  Filled 2014-04-25: qty 5

## 2014-04-25 NOTE — Progress Notes (Signed)
  [place] OFFICE PROGRESS NOTE   Diagnosis: Pancreas cancer  INTERVAL HISTORY:   Sheila Hurst returns as scheduled. She has intermittent constipation. Mild erythema at the Port-A-Cath site noted earlier today. No other complaint.  Objective:  Vital signs in last 24 hours:  Blood pressure 144/61, pulse 59, temperature 98 F (36.7 C), temperature source Oral, resp. rate 20, height 5\' 4"  (1.626 m), weight 143 lb 14.4 oz (65.273 kg), SpO2 100.00%.    HEENT: Neck without mass Lymphatics: No cervical, supraclavicular, axillary, or inguinal nodes Resp: Lungs clear bilaterally Cardio: Regular rate and rhythm GI: No hepatomegaly, no apparent ascites, no mass, 1 cm firm nodularity at the mid aspect of the abdominal scar Vascular: No leg edema   Portacath/PICC-pain erythema and inferior to the Port-A-Cath. No tenderness. Lab Results: CA 19-9 pending    Medications: I have reviewed the patient's current medications.  Assessment/Plan: 1. Adenocarcinoma of the pancreas; mass in the neck of the pancreas, status post EUS guided biopsy 09/15/2012 with the cytology confirming malignant cells consistent with adenocarcinoma, T3 N0 by ultrasound. 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 Elevated CA 19-9. She began radiation and concurrent Xeloda chemotherapy on 10/19/2012, radiation completed 11/30/2012 -Whipple procedure 01/28/2013 with no residual carcinoma seen and stromal fibrosis consistent with post treatment effect.  -She began adjuvant gemcitabine 04/01/2013, completed 07/22/2013.  2. Pain secondary to pancreas cancer. Improved. 3. Right liver cyst. Smaller on MRI 09/28/2012 compared to 11/03/2008. 4. Hypertension currently taking Cardizem 5. Hyperlipidemia. 6. Family history of pancreas cancer (paternal aunt). 7. Anorexia/weight loss. Resolved. 8. Bradycardia when here on 11/11/2012. Atenolol was  placed on hold. 9. Right leg DVT documented on a Doppler 11/26/2012-no longer on anticoagulation. 10. Hypokalemia. She is still taking a potassium supplement. 11. CT abdomen/pelvis 10/10/2013 done to evaluate abdominal pain. No acute abnormality identified in the abdomen and pelvis. Postsurgical changes at the pancreas. Stable liver cyst.   Disposition:  Sheila Hurst remains in clinical remission from pancreas cancer. If the CA 19-9 Is normal today she will be referred for removal of the Port-A-Cath. She will return for an office visit and CA 19-9 in 4 months.  Sheila Coder, MD  04/25/2014  4:16 PM

## 2014-04-25 NOTE — Patient Instructions (Signed)

## 2014-04-25 NOTE — Telephone Encounter (Signed)
gv and printed appt sched and avs for pt for Dec °

## 2014-04-26 LAB — CANCER ANTIGEN 19-9: CA 19-9: 20.6 U/mL (ref <35.0)

## 2014-04-27 ENCOUNTER — Telehealth: Payer: Self-pay | Admitting: *Deleted

## 2014-04-27 NOTE — Telephone Encounter (Signed)
Message copied by Domenic Schwab on Wed Apr 27, 2014  5:00 PM ------      Message from: Erin, [place]: Wed Apr 27, 2014  2:37 PM       Please call patient, ca19-9 is normal ------

## 2014-04-27 NOTE — Telephone Encounter (Signed)
Per Dr. Benay Spice; notified pt that ca19-9 is normal.  Pt verbalized understanding and expressed appreciation for call.

## 2014-05-16 ENCOUNTER — Other Ambulatory Visit (INDEPENDENT_AMBULATORY_CARE_PROVIDER_SITE_OTHER): Payer: Self-pay | Admitting: General Surgery

## 2014-05-16 ENCOUNTER — Ambulatory Visit (INDEPENDENT_AMBULATORY_CARE_PROVIDER_SITE_OTHER): Payer: Medicare Other | Admitting: General Surgery

## 2014-05-16 NOTE — H&P (Signed)
Sheila Hurst 05/16/2014 12:40 PM Location: [facility] Patient #: [id number] DOB: 1940-10-18 Widowed / Language: Cleophus Molt / Race: Black or African American Female  History of Present Illness Stark Klein MD; 05/16/2014 2:34 PM) Patient words: LTF 6 month Whipple recheck.  The patient is a 73 year old female who presents for a follow-up for Pancreatic cancer. 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 Tumor markers include CA 19 - 9 level of 318 (at diagnosis. Latest 16). Initial presentation was for weight loss and abdominal pain. Current diagnosis was determined by an endoscopic retrograde cholangiopancreatography, a needle biopsy and abdominal CT. Treatment has included pancreaticoduodenectomy (Whipple procedure) (May 2014) and neoadjuvant chemotherapy. There is no radiation. Pt is not currently having any pain. 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 She is not having any diarrhea any more. She denies significant reflux. She does feel a "knot" in the center of her wound.    Other Problems Ivor Costa, [place]; 05/16/2014 12:45 PM) High blood pressure Pancreatic Cancer  Past Surgical History Ivor Costa, [place]; 05/16/2014 12:45 PM) Foot Surgery Bilateral. Gallbladder Surgery - Open Pancreas Surgery   Diagnostic Studies History Ivor Costa, [place]; 05/16/2014 12:45 PM) Colonoscopy 5-10 years ago Mammogram within last year  Allergies Ivor Costa, [place]; 05/16/2014 12:46 PM) Zofran *ANTIEMETICS*  Social History Ivor Costa, [place]; 05/16/2014 12:45 PM) Alcohol use Occasional alcohol use. Caffeine use Carbonated beverages, Coffee, Tea. No drug use Tobacco use Former smoker.  Family History Ivor Costa, [place]; 05/16/2014 12:45 PM) Arthritis Brother, Daughter, Father, Mother, Sister, Son. Heart disease in female family  member before age 65 Hypertension Brother, Mother, Sister, Son.  Pregnancy / Birth History Ivor Costa, [place]; 05/16/2014 12:45 PM) Age at menarche 61 years. Age of menopause 16-50 Contraceptive History Oral contraceptives. Gravida 2 Maternal age 61-20 Para 2  Review of Systems Ivor Costa [place]; 05/16/2014 12:45 PM) General Not Present- Appetite Loss, Chills, Fatigue, Fever, Night Sweats, Weight Gain and Weight Loss. Skin Not Present- Change in Wart/Mole, Dryness, Hives, Jaundice, New Lesions, Non-Healing Wounds, Rash and Ulcer. HEENT Not Present- Earache, Hearing Loss, Hoarseness, Nose Bleed, Oral Ulcers, Ringing in the Ears, Seasonal Allergies, Sinus Pain, Sore Throat, Visual Disturbances, Wears glasses/contact lenses and Yellow Eyes. Respiratory Not Present- Bloody sputum, Chronic Cough, Difficulty Breathing, Snoring and Wheezing. Breast Not Present- Breast Mass, Breast Pain, Nipple Discharge and Skin Changes. Cardiovascular Not Present- Chest Pain, Difficulty Breathing Lying Down, Leg Cramps, Palpitations, Rapid Heart Rate, Shortness of Breath and Swelling of Extremities. Gastrointestinal Not Present- Abdominal Pain, Bloating, Bloody Stool, Change in Bowel Habits, Chronic diarrhea, Constipation, Difficulty Swallowing, Excessive gas, Gets full quickly at meals, Hemorrhoids, Indigestion, Nausea, Rectal Pain and Vomiting. Female Genitourinary Present- Frequency and Pelvic Pain. Not Present- Nocturia, Painful Urination and Urgency. Musculoskeletal Present- Joint Pain. Not Present- Back Pain, Joint Stiffness, Muscle Pain, Muscle Weakness and Swelling of Extremities. Neurological Present- Decreased Memory. Not Present- Fainting, Headaches, Numbness, Seizures, Tingling, Tremor, Trouble walking and Weakness. Psychiatric Not Present- Anxiety, Bipolar, Change in Sleep Pattern, Depression, Fearful and Frequent crying. Endocrine Not Present- Cold Intolerance, Excessive Hunger, Hair Changes,  Heat Intolerance, Hot flashes and New Diabetes. Hematology Not Present- Easy Bruising, Excessive bleeding, Gland problems, HIV and Persistent Infections.   Vitals Ivor Costa MA; 05/16/2014 12:45 PM) 05/16/2014 12:44 PM Weight: 141.2 lb Temp.: 97.77F(Temporal)  Pulse: 66 (Regular)  Resp.: 16 (Unlabored)  BP: 130/78 (Sitting, Left  Arm, Standard)    Physical Exam Stark Klein MD; 05/16/2014 2:36 PM) General Mental Status-Alert. General Appearance-Consistent with stated age. Hydration-Well hydrated. Voice-Normal.  Head and Neck Head-normocephalic, atraumatic with no lesions or palpable masses. Trachea-midline. Thyroid Gland Characteristics - normal size and consistency.  Eye Eyeball - Bilateral-Extraocular movements intact. Sclera/Conjunctiva - Bilateral-No scleral icterus.  Chest and Lung Exam Chest and lung exam reveals -quiet, even and easy respiratory effort with no use of accessory muscles. Inspection Chest Wall - Normal. Back - normal.  Cardiovascular Cardiovascular examination reveals -normal pedal pulses bilaterally. Note: regular rate and rhythm  Abdomen Inspection Inspection of the abdomen reveals - No Hernias. Skin - Scar - Epigastrium(midline scar in upper abdomen. Small healed stitch granuloma present in upper middle of wound. Small umbilical hernia present). Palpation/Percussion Palpation and Percussion of the abdomen reveal - Soft, No Rebound tenderness, No Rigidity (guarding) and No hepatosplenomegaly. Tenderness - Epigastrium and Right Lower Quadrant.  Neurologic Neurologic evaluation reveals -alert and oriented x 3 with no impairment of recent or remote memory. Mental Status-Normal.  Musculoskeletal Normal Exam - Left-Upper Extremity Strength Normal and Lower Extremity Strength Normal. Normal Exam - Right-Upper Extremity Strength Normal and Lower Extremity Strength Normal.  Lymphatic Head & Neck  General  Head & Neck Lymphatics: Bilateral - Description - Normal. Axillary  General Axillary Region: Bilateral - Description - Normal. Tenderness - Non Tender.    Assessment & Plan Stark Klein MD; 05/16/2014 2:37 PM) PRIMARY CANCER OF HEAD OF PANCREAS (157.0  C25.0) Impression: No clinical evidence of disease. Pt has follow up arranged with oncology in December for tumor marker and possible scans.  Follow up in 6 months. EXOCRINE PANCREATIC INSUFFICIENCY (577.8  K86.8) Impression: Doing well on 4 caps wtih meals and 2 with snacks.  Samples given and script refilled. no adjustments made. Current Plans  Started Creon 24000UNIT, 1 (one) Capsule DR Part aqac, #330, 05/16/2014, Ref. x11. Local Order: 4 caps with meals and 2 cap with snacks Medical Decision Making Stark Klein MD; 05/16/2014 2:39 PM) Amount/complexity of data to be reviewed: - Independent visualization of lab test - Independent visualization of radiology test - Review and summarization of old records     Signed by Stark Klein, MD (05/16/2014 2:39 PM)

## 2014-06-06 ENCOUNTER — Other Ambulatory Visit: Payer: Self-pay | Admitting: Gynecology

## 2014-06-29 ENCOUNTER — Other Ambulatory Visit: Payer: Self-pay | Admitting: Oncology

## 2014-06-29 ENCOUNTER — Other Ambulatory Visit: Payer: Self-pay | Admitting: *Deleted

## 2014-07-08 ENCOUNTER — Encounter (HOSPITAL_BASED_OUTPATIENT_CLINIC_OR_DEPARTMENT_OTHER): Payer: Self-pay | Admitting: *Deleted

## 2014-07-08 NOTE — Progress Notes (Signed)
To come in for ekg-bmet-doing better

## 2014-07-14 ENCOUNTER — Encounter (HOSPITAL_BASED_OUTPATIENT_CLINIC_OR_DEPARTMENT_OTHER): Payer: Self-pay | Admitting: Anesthesiology

## 2014-07-14 ENCOUNTER — Ambulatory Visit (HOSPITAL_BASED_OUTPATIENT_CLINIC_OR_DEPARTMENT_OTHER): Payer: Medicare Other | Admitting: Anesthesiology

## 2014-07-14 ENCOUNTER — Encounter (HOSPITAL_BASED_OUTPATIENT_CLINIC_OR_DEPARTMENT_OTHER)
Admission: RE | Disposition: A | Discharge status: Home / Self Care | Payer: Self-pay | Source: Ambulatory Visit | Attending: General Surgery

## 2014-07-14 ENCOUNTER — Ambulatory Visit (HOSPITAL_BASED_OUTPATIENT_CLINIC_OR_DEPARTMENT_OTHER)
Admission: RE | Admit: 2014-07-14 | Discharge: 2014-07-14 | Disposition: A | Discharge status: Home / Self Care | Payer: Medicare Other | Source: Ambulatory Visit | Attending: General Surgery | Admitting: General Surgery

## 2014-07-14 DIAGNOSIS — Z90710 Acquired absence of both cervix and uterus: Secondary | ICD-10-CM | POA: Insufficient documentation

## 2014-07-14 DIAGNOSIS — C259 Malignant neoplasm of pancreas, unspecified: Secondary | ICD-10-CM | POA: Diagnosis present

## 2014-07-14 DIAGNOSIS — Q428 Congenital absence, atresia and stenosis of other parts of large intestine: Secondary | ICD-10-CM | POA: Diagnosis not present

## 2014-07-14 DIAGNOSIS — E785 Hyperlipidemia, unspecified: Secondary | ICD-10-CM | POA: Diagnosis not present

## 2014-07-14 DIAGNOSIS — M1990 Unspecified osteoarthritis, unspecified site: Secondary | ICD-10-CM | POA: Diagnosis not present

## 2014-07-14 DIAGNOSIS — Z9221 Personal history of antineoplastic chemotherapy: Secondary | ICD-10-CM | POA: Insufficient documentation

## 2014-07-14 DIAGNOSIS — I10 Essential (primary) hypertension: Secondary | ICD-10-CM | POA: Diagnosis not present

## 2014-07-14 DIAGNOSIS — Z87891 Personal history of nicotine dependence: Secondary | ICD-10-CM | POA: Diagnosis not present

## 2014-07-14 DIAGNOSIS — I491 Atrial premature depolarization: Secondary | ICD-10-CM | POA: Diagnosis not present

## 2014-07-14 DIAGNOSIS — K219 Gastro-esophageal reflux disease without esophagitis: Secondary | ICD-10-CM | POA: Insufficient documentation

## 2014-07-14 DIAGNOSIS — Z800 Family history of malignant neoplasm of digestive organs: Secondary | ICD-10-CM | POA: Diagnosis not present

## 2014-07-14 HISTORY — PX: PORT-A-CATH REMOVAL: SHX5289

## 2014-07-14 LAB — POCT I-STAT, CHEM 8
BUN: 15 mg/dL (ref 6–23)
CHLORIDE: 104 meq/L (ref 96–112)
Calcium, Ion: 1.06 mmol/L — ABNORMAL LOW (ref 1.13–1.30)
Creatinine, Ser: 0.6 mg/dL (ref 0.50–1.10)
Glucose, Bld: 113 mg/dL — ABNORMAL HIGH (ref 70–99)
HCT: 43 % (ref 36.0–46.0)
Hemoglobin: 14.6 g/dL (ref 12.0–15.0)
Potassium: 3.8 mEq/L (ref 3.7–5.3)
SODIUM: 139 meq/L (ref 137–147)
TCO2: 25 mmol/L (ref 0–100)

## 2014-07-14 SURGERY — REMOVAL PORT-A-CATH
Anesthesia: Monitor Anesthesia Care | Site: Chest | Laterality: Right

## 2014-07-14 MED ORDER — PROPOFOL INFUSION 10 MG/ML OPTIME
INTRAVENOUS | Status: DC | PRN
  Administered 2014-07-14: 50 ug/kg/min via INTRAVENOUS

## 2014-07-14 MED ORDER — LIDOCAINE-EPINEPHRINE (PF) 1 %-1:200000 IJ SOLN
INTRAMUSCULAR | Status: DC | PRN
  Administered 2014-07-14: 4 mL

## 2014-07-14 MED ORDER — CEFAZOLIN SODIUM-DEXTROSE 2-3 GM-% IV SOLR
INTRAVENOUS | Status: AC
  Filled 2014-07-14: qty 50

## 2014-07-14 MED ORDER — LIDOCAINE-EPINEPHRINE (PF) 1 %-1:200000 IJ SOLN
INTRAMUSCULAR | Status: AC
  Filled 2014-07-14: qty 10

## 2014-07-14 MED ORDER — FENTANYL CITRATE 0.05 MG/ML IJ SOLN
INTRAMUSCULAR | Status: AC
  Filled 2014-07-14: qty 2

## 2014-07-14 MED ORDER — BUPIVACAINE HCL (PF) 0.25 % IJ SOLN
INTRAMUSCULAR | Status: DC | PRN
  Administered 2014-07-14: 4 mL

## 2014-07-14 MED ORDER — LACTATED RINGERS IV SOLN
INTRAVENOUS | Status: DC
  Administered 2014-07-14: 07:00:00 via INTRAVENOUS

## 2014-07-14 MED ORDER — FENTANYL CITRATE 0.05 MG/ML IJ SOLN
INTRAMUSCULAR | Status: DC | PRN
  Administered 2014-07-14 (×2): 50 ug via INTRAVENOUS

## 2014-07-14 MED ORDER — CEFAZOLIN SODIUM-DEXTROSE 2-3 GM-% IV SOLR
2.0000 g | INTRAVENOUS | Status: AC
Start: 2014-07-14 — End: 2014-07-14
  Administered 2014-07-14: 2 g via INTRAVENOUS

## 2014-07-14 MED ORDER — BUPIVACAINE-EPINEPHRINE (PF) 0.5% -1:200000 IJ SOLN
INTRAMUSCULAR | Status: AC
  Filled 2014-07-14: qty 30

## 2014-07-14 MED ORDER — LIDOCAINE HCL (CARDIAC) 20 MG/ML IV SOLN
INTRAVENOUS | Status: DC | PRN
  Administered 2014-07-14: 50 mg via INTRAVENOUS

## 2014-07-14 MED ORDER — MIDAZOLAM HCL 2 MG/2ML IJ SOLN: 1.0000 mg | INTRAMUSCULAR | Status: DC | PRN

## 2014-07-14 MED ORDER — FENTANYL CITRATE 0.05 MG/ML IJ SOLN: 50.0000 ug | INTRAMUSCULAR | Status: DC | PRN

## 2014-07-14 MED ORDER — FENTANYL CITRATE 0.05 MG/ML IJ SOLN: 25.0000 ug | INTRAMUSCULAR | Status: DC | PRN

## 2014-07-14 MED ORDER — ONDANSETRON HCL 4 MG/2ML IJ SOLN: 4.0000 mg | Freq: Once | INTRAMUSCULAR | Status: DC | PRN

## 2014-07-14 MED ORDER — HYDROCODONE-ACETAMINOPHEN 5-325 MG PO TABS: 1.0000 | ORAL_TABLET | ORAL | Status: DC | PRN

## 2014-07-14 MED ORDER — BUPIVACAINE HCL (PF) 0.25 % IJ SOLN
INTRAMUSCULAR | Status: AC
  Filled 2014-07-14: qty 30

## 2014-07-14 SURGICAL SUPPLY — 35 items
BLADE HEX COATED 2.75 (ELECTRODE) ×3 IMPLANT
BLADE SURG 15 STRL LF DISP TIS (BLADE) ×1 IMPLANT
BLADE SURG 15 STRL SS (BLADE) ×3
CANISTER SUCT 1200ML W/VALVE (MISCELLANEOUS) IMPLANT
CHLORAPREP W/TINT 26ML (MISCELLANEOUS) ×3 IMPLANT
COVER BACK TABLE 60X90IN (DRAPES) ×3 IMPLANT
COVER MAYO STAND STRL (DRAPES) ×3 IMPLANT
DECANTER SPIKE VIAL GLASS SM (MISCELLANEOUS) IMPLANT
DRAPE PED LAPAROTOMY (DRAPES) ×3 IMPLANT
DRAPE UTILITY XL STRL (DRAPES) ×3 IMPLANT
ELECT REM PT RETURN 9FT ADLT (ELECTROSURGICAL) ×3
ELECTRODE REM PT RTRN 9FT ADLT (ELECTROSURGICAL) ×1 IMPLANT
GLOVE BIO SURGEON STRL SZ 6 (GLOVE) ×3 IMPLANT
GLOVE BIOGEL PI IND STRL 6.5 (GLOVE) ×1 IMPLANT
GLOVE BIOGEL PI IND STRL 7.0 (GLOVE) ×1 IMPLANT
GLOVE BIOGEL PI INDICATOR 6.5 (GLOVE) ×2
GLOVE BIOGEL PI INDICATOR 7.0 (GLOVE) ×2
GLOVE ECLIPSE 6.5 STRL STRAW (GLOVE) ×3 IMPLANT
GOWN STRL REUS W/ TWL LRG LVL3 (GOWN DISPOSABLE) ×1 IMPLANT
GOWN STRL REUS W/TWL 2XL LVL3 (GOWN DISPOSABLE) ×3 IMPLANT
GOWN STRL REUS W/TWL LRG LVL3 (GOWN DISPOSABLE) ×2
LIQUID BAND (GAUZE/BANDAGES/DRESSINGS) ×3 IMPLANT
NEEDLE HYPO 25X1 1.5 SAFETY (NEEDLE) ×3 IMPLANT
NS IRRIG 1000ML POUR BTL (IV SOLUTION) IMPLANT
PACK BASIN DAY SURGERY FS (CUSTOM PROCEDURE TRAY) ×3 IMPLANT
PENCIL BUTTON HOLSTER BLD 10FT (ELECTRODE) ×3 IMPLANT
SUT MNCRL AB 4-0 PS2 18 (SUTURE) ×3 IMPLANT
SUT VIC AB 3-0 SH 27 (SUTURE) ×2
SUT VIC AB 3-0 SH 27X BRD (SUTURE) ×1 IMPLANT
SYR CONTROL 10ML LL (SYRINGE) ×3 IMPLANT
TOWEL OR 17X24 6PK STRL BLUE (TOWEL DISPOSABLE) ×3 IMPLANT
TOWEL OR NON WOVEN STRL DISP B (DISPOSABLE) IMPLANT
TUBE CONNECTING 20'X1/4 (TUBING)
TUBE CONNECTING 20X1/4 (TUBING) IMPLANT
YANKAUER SUCT BULB TIP NO VENT (SUCTIONS) IMPLANT

## 2014-07-14 NOTE — Anesthesia Preprocedure Evaluation (Addendum)
Anesthesia Evaluation  Patient identified by MRN, date of birth, ID band Patient awake    Reviewed: Allergy & Precautions, H&P , NPO status , Patient's Chart, lab work & pertinent test results  Airway Mallampati: I  TM Distance: >3 FB Neck ROM: Full    Dental  (+) Upper Dentures, Dental Advisory Given   Pulmonary former smoker,  breath sounds clear to auscultation        Cardiovascular hypertension, Pt. on medications Rhythm:Regular Rate:Normal     Neuro/Psych    GI/Hepatic GERD-  Medicated and Controlled,  Endo/Other    Renal/GU      Musculoskeletal   Abdominal   Peds  Hematology   Anesthesia Other Findings   Reproductive/Obstetrics                             Anesthesia Physical Anesthesia Plan  ASA: III  Anesthesia Plan: MAC   Post-op Pain Management:    Induction: Intravenous  Airway Management Planned: Simple Face Mask  Additional Equipment:   Intra-op Plan:   Post-operative Plan:   Informed Consent: I have reviewed the patients History and Physical, chart, labs and discussed the procedure including the risks, benefits and alternatives for the proposed anesthesia with the patient or authorized representative who has indicated his/her understanding and acceptance.   Dental advisory given  Plan Discussed with: CRNA, Anesthesiologist and Surgeon  Anesthesia Plan Comments: (Will not use Zofran due to allergy)       Anesthesia Quick Evaluation

## 2014-07-14 NOTE — H&P (Signed)
Sheila Hurst is an 73 y.o. female.   Chief Complaint: pancreatic cancer HPI: Pt with ho pancreatic cancer.  She had a complete pathologic response.  She is done with chemo and requests removal of port a cath.    Past Medical History  Diagnosis Date  . Hypertension   . GERD (gastroesophageal reflux disease)     no medication  . Arthritis     left knee  . Hyperlipidemia   . Pancreatic mass 09/15/2012    Head of Pancreas, malignant cells consistent w/adenocarcinoma  . Weight loss     Prior to Dx  . Bronchitis   . Radiation 10/19/12-11/30/12    50.4 gray Pancreatic Ca/abdomen  . Wears glasses   . Wears dentures     top    Past Surgical History  Procedure Laterality Date  . Badder tack  09-2011  . Abdominal hysterectomy  1993    total  . Tonsillectomy      age 53  . Appendectomy  70's  . Tubal ligation  1977  . Eus  09/15/2012    Procedure: ESOPHAGEAL ENDOSCOPIC ULTRASOUND (EUS) RADIAL;  Surgeon: Arta Silence, MD;  Location: [facility];  Service: Endoscopy;  Laterality: N/A;  + or - fna unsure of what scope  . Fine needle aspiration  09/15/2012    Procedure: FINE NEEDLE ASPIRATION (FNA) LINEAR;  Surgeon: Arta Silence, MD;  Location: [facility];  Service: Endoscopy;  Laterality: N/A;  . Belpharoptosis repair    . Laparoscopy N/A 01/28/2013    Procedure: LAPAROSCOPY DIAGNOSTIC,  WHIPPLE;  Surgeon: Stark Klein, MD;  Location: [facility];  Service: General;  Laterality: N/A;  . Whipple procedure N/A 01/28/2013    Procedure: WHIPPLE PROCEDURE;  Surgeon: Stark Klein, MD;  Location: [facility];  Service: General;  Laterality: N/A;  . Portacath placement N/A 03/18/2013    Procedure: INSERTION PORT-A-CATH;  Surgeon: Stark Klein, MD;  Location: [facility];  Service: General;  Laterality: N/A;  Flouro time 1minute, 11 seconds.    Family History  Problem Relation Age of Onset  . Throat cancer Father     died age 88  . Pancreatic cancer Paternal Aunt    Social History:   reports that she quit smoking about 35 years ago. Her smoking use included Cigarettes. She has a 7.2 pack-year smoking history. She has never used smokeless tobacco. She reports that she does not drink alcohol or use illicit drugs.  Allergies:  Allergies  Allergen Reactions  . Ondansetron Rash    Medications Prior to Admission  Medication Sig Dispense Refill  . diltiazem (CARDIZEM) 90 MG tablet Take 90 mg by mouth every morning.     . docusate sodium (COLACE) 100 MG capsule Take 100 mg by mouth 2 (two) times daily.    . Glucosamine-Chondroit-Vit C-Mn (GLUCOSAMINE 1500 COMPLEX PO) Take 1 capsule by mouth daily.    . Pancrelipase, Lip-Prot-Amyl, 24000 UNITS CPEP Take 4 caps with meals and 2 with snacks. 450 capsule 11  . pantoprazole (PROTONIX) 40 MG tablet Take 1 tablet (40 mg total) by mouth daily. 30 tablet 2  . potassium chloride SA (K-DUR,KLOR-CON) 20 MEQ tablet TAKE 1 TABLET (20 MEQ TOTAL) BY MOUTH DAILY. 30 tablet 3  . valsartan (DIOVAN) 160 MG tablet Take 1 tablet by mouth every morning.      Results for orders placed or performed during the hospital encounter of 07/14/14 (from the past 48 hour(s))  I-STAT, chem 8     Status: Abnormal  Collection Time: 07/14/14  6:59 AM  Result Value Ref Range   Sodium 139 137 - 147 mEq/L   Potassium 3.8 3.7 - 5.3 mEq/L   Chloride 104 96 - 112 mEq/L   BUN 15 6 - 23 mg/dL   Creatinine, Ser 0.60 0.50 - 1.10 mg/dL   Glucose, Bld 113 (H) 70 - 99 mg/dL   Calcium, Ion 1.06 (L) 1.13 - 1.30 mmol/L   TCO2 25 0 - 100 mmol/L   Hemoglobin 14.6 12.0 - 15.0 g/dL   HCT 43.0 36.0 - 46.0 %   No results found.  Review of Systems  All other systems reviewed and are negative.   Blood pressure 158/87, pulse 52, temperature 97.9 F (36.6 C), temperature source Oral, resp. rate 16, height 5\' 2"  (1.575 m), weight 145 lb 8 oz (65.998 kg), SpO2 100 %. Physical Exam  Constitutional: She is oriented to person, place, and time. She appears well-developed and  well-nourished. No distress.  HENT:  Head: Normocephalic and atraumatic.  Eyes: Pupils are equal, round, and reactive to light. No scleral icterus.  Cardiovascular: Normal rate.   Respiratory: Effort normal. No respiratory distress.  GI: Soft.  Neurological: She is alert and oriented to person, place, and time.  Skin: Skin is warm and dry.  Psychiatric: She has a normal mood and affect. Her behavior is normal. Judgment and thought content normal.     Assessment/Plan Pancreatic cancer. Plan remove port a cath.  Discussed risks and benefits.    Daliah Chaudoin 07/14/2014, 7:27 AM

## 2014-07-14 NOTE — Transfer of Care (Signed)
Immediate Anesthesia Transfer of Care Note  Patient: Sheila Hurst  Procedure(s) Performed: Procedure(s): REMOVAL PORT-A-CATH (Right)  Patient Location: PACU  Anesthesia Type:MAC  Level of Consciousness: awake and alert   Airway & Oxygen Therapy: Patient Spontanous Breathing and Patient connected to face mask oxygen  Post-op Assessment: Report given to PACU RN and Post -op Vital signs reviewed and stable  Post vital signs: Reviewed and stable  Complications: No apparent anesthesia complications

## 2014-07-14 NOTE — Anesthesia Procedure Notes (Signed)
Procedure Name: MAC Date/Time: 07/14/2014 7:44 AM Performed by: Lieutenant Diego Pre-anesthesia Checklist: Patient identified, Emergency Drugs available, Suction available and Patient being monitored Patient Re-evaluated:Patient Re-evaluated prior to inductionOxygen Delivery Method: Simple face mask Preoxygenation: Pre-oxygenation with 100% oxygen Intubation Type: IV induction

## 2014-07-14 NOTE — Discharge Instructions (Addendum)
[facility] °Office Phone Number [phone] ° ° POST OP INSTRUCTIONS ° °Always review your discharge instruction sheet given to you by the facility where your surgery was performed. ° °IF YOU HAVE DISABILITY OR FAMILY LEAVE FORMS, YOU MUST BRING THEM TO THE OFFICE FOR PROCESSING.  DO NOT GIVE THEM TO YOUR DOCTOR. ° °1. A prescription for pain medication may be given to you upon discharge.  Take your pain medication as prescribed, if needed.  If narcotic pain medicine is not needed, then you may take acetaminophen (Tylenol) or ibuprofen (Advil) as needed. °2. Take your usually prescribed medications unless otherwise directed °3. If you need a refill on your pain medication, please contact your pharmacy.  They will contact our office to request authorization.  Prescriptions will not be filled after 5pm or on week-ends. °4. You should eat very light the first 24 hours after surgery, such as soup, crackers, pudding, etc.  Resume your normal diet the day after surgery °5. It is common to experience some constipation if taking pain medication after surgery.  Increasing fluid intake and taking a stool softener will usually help or prevent this problem from occurring.  A mild laxative (Milk of Magnesia or Miralax) should be taken according to package directions if there are no bowel movements after 48 hours. °6. You may shower in 48 hours.  The surgical glue will flake off in 2-3 weeks.   °7. ACTIVITIES:  No strenuous activity or heavy lifting for 1 week.   °a. You may drive when you no longer are taking prescription pain medication, you can comfortably wear a seatbelt, and you can safely maneuver your car and apply brakes. °b. RETURN TO WORK:  __________n/a_______________ °You should see your doctor in the office for a follow-up appointment approximately three-four weeks after your surgery.   ° °WHEN TO CALL YOUR DOCTOR: °1. Fever over 101.0 °2. Nausea and/or vomiting. °3. Extreme swelling or  bruising. °4. Continued bleeding from incision. °5. Increased pain, redness, or drainage from the incision. ° °The clinic staff is available to answer your questions during regular business hours.  Please don’t hesitate to call and ask to speak to one of the nurses for clinical concerns.  If you have a medical emergency, go to the nearest emergency room or call 911.  A surgeon from [facility] is always on call at the hospital. ° °For further questions, please visit [email or web address]  ° ° °Post Anesthesia Home Care Instructions ° °Activity: °Get plenty of rest for the remainder of the day. A responsible adult should stay with you for 24 hours following the procedure.  °For the next 24 hours, DO NOT: °-Drive a car °-Operate machinery °-Drink alcoholic beverages °-Take any medication unless instructed by your physician °-Make any legal decisions or sign important papers. ° °Meals: °Start with liquid foods such as gelatin or soup. Progress to regular foods as tolerated. Avoid greasy, spicy, heavy foods. If nausea and/or vomiting occur, drink only clear liquids until the nausea and/or vomiting subsides. Call your physician if vomiting continues. ° °Special Instructions/Symptoms: °Your throat may feel dry or sore from the anesthesia or the breathing tube placed in your throat during surgery. If this causes discomfort, gargle with warm salt water. The discomfort should disappear within 24 hours. ° °

## 2014-07-14 NOTE — Op Note (Signed)
  PRE-OPERATIVE DIAGNOSIS:  un-needed Port-A-Cath for pancreatic cancer  POST-OPERATIVE DIAGNOSIS:  Same   PROCEDURE:  Procedure(s):  REMOVAL PORT-A-CATH  SURGEON:  Surgeon(s):  Stark Klein, MD  ANESTHESIA:   MAC + local  EBL:   Minimal  SPECIMEN:  None  Complications : none known  Procedure:   Pt was  identified in the holding area and taken to the operating room where she was placed supine on the operating room table.  General anesthesia was induced via LMA.  The R upper chest was prepped and draped.  The prior incision was anesthetized with local anesthetic.  The incision was opened with a #15 blade.  The subcutaneous tissue was divided with the cautery.  The port was identified and the capsule opened.  The 4 2-0 prolene sutures were removed.  The port was then removed and pressure held on the tract.  The catheter appeared intact without evidence of breakage.  The wound was inspected for hemostasis, which was achieved with cautery.  The wound was closed with 3-0 vicryl deep dermal interrupted sutures and 4-0 Monocryl running subcuticular suture.  The wound was cleaned, dried, and dressed with dermabond.  The patient was awakened from anesthesia and taken to the PACU in stable condition.  Needle, sponge, and instrument counts are correct.

## 2014-07-14 NOTE — Anesthesia Postprocedure Evaluation (Signed)
  Anesthesia Post-op Note  Patient: Sheila Hurst  Procedure(s) Performed: Procedure(s): REMOVAL PORT-A-CATH (Right)  Patient Location: PACU  Anesthesia Type: MAC   Level of Consciousness: awake, alert  and oriented  Airway and Oxygen Therapy: Patient Spontanous Breathing  Post-op Pain: mild  Post-op Assessment: Post-op Vital signs reviewed  Post-op Vital Signs: Reviewed  Last Vitals:  Filed Vitals:   07/14/14 0915  BP: 157/61  Pulse: 52  Temp: 36.6 C  Resp: 18    Complications: No apparent anesthesia complications

## 2014-07-15 ENCOUNTER — Encounter (HOSPITAL_BASED_OUTPATIENT_CLINIC_OR_DEPARTMENT_OTHER): Payer: Self-pay | Admitting: General Surgery

## 2014-08-10 ENCOUNTER — Other Ambulatory Visit: Payer: Self-pay

## 2014-08-10 DIAGNOSIS — Z1231 Encounter for screening mammogram for malignant neoplasm of breast: Secondary | ICD-10-CM

## 2014-08-11 ENCOUNTER — Other Ambulatory Visit: Payer: Self-pay | Admitting: Family Medicine

## 2014-08-11 DIAGNOSIS — E2839 Other primary ovarian failure: Secondary | ICD-10-CM

## 2014-08-22 ENCOUNTER — Telehealth: Payer: Self-pay | Admitting: Nurse Practitioner

## 2014-08-22 ENCOUNTER — Ambulatory Visit (HOSPITAL_BASED_OUTPATIENT_CLINIC_OR_DEPARTMENT_OTHER): Payer: Medicare Other | Admitting: Nurse Practitioner

## 2014-08-22 ENCOUNTER — Ambulatory Visit: Payer: Medicare Other | Admitting: Nurse Practitioner

## 2014-08-22 ENCOUNTER — Ambulatory Visit: Payer: Medicare Other

## 2014-08-22 VITALS — BP 168/80 | HR 55 | Temp 97.6°F | Resp 19 | Ht 62.0 in | Wt 145.0 lb

## 2014-08-22 DIAGNOSIS — C250 Malignant neoplasm of head of pancreas: Secondary | ICD-10-CM

## 2014-08-22 DIAGNOSIS — C259 Malignant neoplasm of pancreas, unspecified: Secondary | ICD-10-CM

## 2014-08-22 DIAGNOSIS — E876 Hypokalemia: Secondary | ICD-10-CM

## 2014-08-22 DIAGNOSIS — Z95828 Presence of other vascular implants and grafts: Secondary | ICD-10-CM

## 2014-08-22 DIAGNOSIS — G893 Neoplasm related pain (acute) (chronic): Secondary | ICD-10-CM

## 2014-08-22 MED ORDER — HEPARIN SOD (PORK) LOCK FLUSH 100 UNIT/ML IV SOLN
500.0000 [IU] | Freq: Once | INTRAVENOUS | Status: DC
  Filled 2014-08-22: qty 5

## 2014-08-22 MED ORDER — SODIUM CHLORIDE 0.9 % IJ SOLN
10.0000 mL | INTRAMUSCULAR | Status: DC | PRN
  Filled 2014-08-22: qty 10

## 2014-08-22 NOTE — Progress Notes (Signed)
Per Patient her Port-A-Cath was removed on 07/14/2014. All labs drawn by Maudie Mercury, Phlebotomist today.

## 2014-08-22 NOTE — Progress Notes (Signed)
  [facility] OFFICE PROGRESS NOTE   Diagnosis:  Pancreas cancer  INTERVAL HISTORY:   Sheila Hurst returns as scheduled. She feels well. She has a good appetite and good energy level. No nausea or vomiting. No diarrhea. She denies shortness of breath. She has occasional pain in the right groin region.  Objective:  Vital signs in last 24 hours:  Blood pressure 168/80, pulse 55, temperature 97.6 F (36.4 C), temperature source Oral, resp. rate 19, height 5\' 2"  (1.575 m), weight 145 lb (65.772 kg), SpO2 100 %.    HEENT: No thrush or ulcers. Lymphatics: No palpable cervical, supraclavicular, axillary or inguinal lymph nodes. Resp: Lungs clear bilaterally. Cardio: Regular rate and rhythm. GI: Abdomen soft and nontender. No hepatomegaly. No mass. Soft, reducible hernia mid to low aspect of the abdominal scar. Vascular: No edema. Soft and nontender.   Lab Results:  Lab Results  Component Value Date   WBC 3.5* 10/10/2013   HGB 14.6 07/14/2014   HCT 43.0 07/14/2014   MCV 87.7 10/10/2013   PLT 182 10/10/2013   NEUTROABS 2.2 10/10/2013   04/25/2014 CA-19-9 20.6  Imaging:  No results found.  Medications: I have reviewed the patient's current medications.  Assessment/Plan: 1. Adenocarcinoma of the pancreas; mass in the neck of the pancreas, status post EUS guided biopsy 09/15/2012 with the cytology confirming malignant cells consistent with adenocarcinoma, T3 N0 by ultrasound. 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 Elevated CA 19-9. She began radiation and concurrent Xeloda chemotherapy on 10/19/2012, radiation completed 11/30/2012 -Whipple procedure 01/28/2013 with no residual carcinoma seen and stromal fibrosis consistent with post treatment effect.  -She began adjuvant gemcitabine 04/01/2013, completed 07/22/2013.  2. Pain secondary to pancreas cancer. Improved. 3. Right liver cyst. Smaller  on MRI 09/28/2012 compared to 11/03/2008. 4. Hypertension currently taking Cardizem 5. Hyperlipidemia. 6. Family history of pancreas cancer (paternal aunt). 7. Anorexia/weight loss. Resolved. 8. Bradycardia when here on 11/11/2012. Atenolol was placed on hold. 9. Right leg DVT documented on a Doppler 11/26/2012-no longer on anticoagulation. 10. Hypokalemia. She is still taking a potassium supplement. 11. CT abdomen/pelvis 10/10/2013 done to evaluate abdominal pain. No acute abnormality identified in the abdomen and pelvis. Postsurgical changes at the pancreas. Stable liver cyst. 12. Port-A-Cath removal 07/14/2014.   Disposition: Sheila Hurst appears well. She remains in clinical remission from pancreas cancer. We will follow up on the CA-19-9 from today. She will return for a follow-up visit and CA-19-9 in 6 months.  Plan reviewed with Dr. Benay Spice.    Ned Card ANP/GNP-BC   08/22/2014  11:01 AM

## 2014-08-22 NOTE — Telephone Encounter (Signed)
Lft msg for pt confirming labs/ov per 12/21 POF, mailed out schedule to pt.Marland Kitchen KJ

## 2014-08-22 NOTE — Telephone Encounter (Signed)
Gave avs & cal for June 2016. °

## 2014-08-23 LAB — CANCER ANTIGEN 19-9: CA 19-9: 19 U/mL (ref <35.0)

## 2014-08-24 ENCOUNTER — Telehealth: Payer: Self-pay | Admitting: *Deleted

## 2014-08-24 NOTE — Telephone Encounter (Signed)
-----   Message from Sheila Pier, MD sent at 08/23/2014  6:28 PM EST ----- Please call patient, ca19-9 is normal

## 2014-08-24 NOTE — Telephone Encounter (Signed)
Called and informed patient that ca 19-9 is normal.  Per Dr. Benay Spice.  Patient verbalized understanding.

## 2014-08-29 ENCOUNTER — Ambulatory Visit
Admission: RE | Admit: 2014-08-29 | Discharge: 2014-08-29 | Disposition: A | Discharge status: Home / Self Care | Payer: Medicare Other | Source: Ambulatory Visit

## 2014-08-29 DIAGNOSIS — Z1231 Encounter for screening mammogram for malignant neoplasm of breast: Secondary | ICD-10-CM

## 2014-09-23 ENCOUNTER — Ambulatory Visit (HOSPITAL_BASED_OUTPATIENT_CLINIC_OR_DEPARTMENT_OTHER): Payer: 59 | Admitting: Nurse Practitioner

## 2014-09-23 VITALS — BP 161/70 | HR 62 | Temp 98.4°F | Resp 18

## 2014-09-23 DIAGNOSIS — M26609 Unspecified temporomandibular joint disorder, unspecified side: Secondary | ICD-10-CM

## 2014-09-23 DIAGNOSIS — C250 Malignant neoplasm of head of pancreas: Secondary | ICD-10-CM

## 2014-09-23 DIAGNOSIS — C259 Malignant neoplasm of pancreas, unspecified: Secondary | ICD-10-CM

## 2014-09-23 DIAGNOSIS — M2660 Temporomandibular joint disorder, unspecified: Secondary | ICD-10-CM

## 2014-09-25 ENCOUNTER — Encounter: Payer: Self-pay | Admitting: Nurse Practitioner

## 2014-09-25 DIAGNOSIS — M26609 Unspecified temporomandibular joint disorder, unspecified side: Secondary | ICD-10-CM | POA: Insufficient documentation

## 2014-09-25 NOTE — Assessment & Plan Note (Signed)
Patient is status post Whipple surgery, chemotherapy, and radiation. She is currently under observation only.  CA 19-9 obtained in December 2015 was within normal limits.   Patient is scheduled to return 02/21/15 for labs and a follow up visit.   Patient knows to call with any new worries or concerns in the interim.

## 2014-09-25 NOTE — Assessment & Plan Note (Signed)
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 Pt now c/o approx 10 day hx of bilat jaw pain; occasional sharp headache radiating from his jaw areas to frontal scalp area. Patient denies any URI symptoms, chest pain, chest pressure, SOB, or pain with inspiration.  Also denies any vision issues or other neuro changes.   On exam- mild tenderness with palpation to bilat TMJ areas.  Most likely, bilat jaw pain is recurrence of patient's previous TMJ.  Advised that patient continue with Ibuprofen as directed; and to follow up with her PCP if jaw pain no better.

## 2014-09-25 NOTE — Progress Notes (Signed)
SYMPTOM MANAGEMENT CLINIC   HPI: Sheila Hurst 74 y.o. female diagnosed with pancreatic cancer.   Patient is status post Whipple surgery, chemotherapy, and radiation treatments in the past. She is currently undergoing observation only.     Patient presented as a walk in today with c/o bilat jaw pain and occasional headaches.  Denies any dental issues, or URI symptoms. Denies any vision changes or other neuro changes. Denies any recent fevers or chills.   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  She has been taking Ibuprofen for the pain; which has helped with her discomfort.   HPI  CURRENT THERAPY: No active treatment plan for patient.   ROS  Past Medical History  Diagnosis Date  . Hypertension   . GERD (gastroesophageal reflux disease)     no medication  . Arthritis     left knee  . Hyperlipidemia   . Pancreatic mass 09/15/2012    Head of Pancreas, malignant cells consistent w/adenocarcinoma  . Weight loss     Prior to Dx  . Bronchitis   . Radiation 10/19/12-11/30/12    50.4 gray Pancreatic Ca/abdomen  . Wears glasses   . Wears dentures     top    Past Surgical History  Procedure Laterality Date  . Badder tack  09-2011  . Abdominal hysterectomy  1993    total  . Tonsillectomy      age 75  . Appendectomy  70's  . Tubal ligation  1977  . Eus  09/15/2012    Procedure: ESOPHAGEAL ENDOSCOPIC ULTRASOUND (EUS) RADIAL;  Surgeon: Arta Silence, MD;  Location: [facility];  Service: Endoscopy;  Laterality: N/A;  + or - fna unsure of what scope  . Fine needle aspiration  09/15/2012    Procedure: FINE NEEDLE ASPIRATION (FNA) LINEAR;  Surgeon: Arta Silence, MD;  Location: [facility];  Service: Endoscopy;  Laterality: N/A;  . Belpharoptosis repair    . Laparoscopy N/A 01/28/2013    Procedure: LAPAROSCOPY DIAGNOSTIC,  WHIPPLE;  Surgeon: Stark Klein, MD;  Location: [facility];  Service: General;  Laterality: N/A;  . Whipple  procedure N/A 01/28/2013    Procedure: WHIPPLE PROCEDURE;  Surgeon: Stark Klein, MD;  Location: [facility];  Service: General;  Laterality: N/A;  . Portacath placement N/A 03/18/2013    Procedure: INSERTION PORT-A-CATH;  Surgeon: Stark Klein, MD;  Location: [facility];  Service: General;  Laterality: N/A;  Flouro time 45minute, 11 seconds.  . Port-a-cath removal Right 07/14/2014    Procedure: REMOVAL PORT-A-CATH;  Surgeon: Stark Klein, MD;  Location: [facility];  Service: General;  Laterality: Right;    has Pancreatic adenocarcinoma; Hyperlipidemia; Arthritis; GERD (gastroesophageal reflux disease); Hypertension; Malignant neoplasm of head of pancreas; Exocrine pancreatic insufficiency; and TMJ (temporomandibular joint disorder) on her problem list.    is allergic to ondansetron.    Medication List       This list is accurate as of: 09/23/14 11:59 PM.  Always use your most recent med list.               betamethasone valerate ointment 0.1 %  Commonly known as:  VALISONE     diltiazem 90 MG tablet  Commonly known as:  CARDIZEM  Take 90 mg by mouth every morning.     docusate sodium 100 MG capsule  Commonly known as:  COLACE  Take 100 mg by mouth 2 (two) times daily.  GLUCOSAMINE 1500 COMPLEX PO  Take 1 capsule by mouth daily.     Pancrelipase (Lip-Prot-Amyl) 24000 UNITS Cpep  Take 4 caps with meals and 2 with snacks.     pantoprazole 40 MG tablet  Commonly known as:  PROTONIX  Take 1 tablet (40 mg total) by mouth daily.     potassium chloride SA 20 MEQ tablet  Commonly known as:  K-DUR,KLOR-CON  TAKE 1 TABLET (20 MEQ TOTAL) BY MOUTH DAILY.     valsartan 160 MG tablet  Commonly known as:  DIOVAN  Take 1 tablet by mouth every morning.         PHYSICAL EXAMINATION  Blood pressure 161/70, pulse 62, temperature 98.4 F (36.9 C), temperature source Oral, resp. rate 18, SpO2 98 %.  Physical Exam  Constitutional: She is oriented to  person, place, and time and well-developed, well-nourished, and in no distress.  HENT:  Head: Normocephalic and atraumatic.  Right Ear: External ear normal.  Left Ear: External ear normal.  Nose: Nose normal.  Mouth/Throat: Oropharynx is clear and moist.  Mild tenderness to bilat TMJ areas with palpation. Still with full ROM of bilat jaws.   Eyes: Conjunctivae and EOM are normal. Pupils are equal, round, and reactive to light. Right eye exhibits no discharge. Left eye exhibits no discharge. No scleral icterus.  Neck: Normal range of motion. Neck supple. No JVD present. No tracheal deviation present. No thyromegaly present.  Cardiovascular: Normal rate, regular rhythm, normal heart sounds and intact distal pulses.   Pulmonary/Chest: Effort normal and breath sounds normal. No respiratory distress. She has no wheezes. She has no rales. She exhibits no tenderness.  Abdominal: Soft. Bowel sounds are normal. She exhibits no distension and no mass. There is no tenderness. There is no rebound and no guarding.  Musculoskeletal: Normal range of motion. She exhibits no edema or tenderness.  Lymphadenopathy:    She has no cervical adenopathy.  Neurological: She is alert and oriented to person, place, and time. Gait normal.  Skin: Skin is warm and dry. No rash noted. No erythema.  Psychiatric: Affect normal.  Nursing note and vitals reviewed.   LABORATORY DATA:. No visits with results within 3 Day(s) from this visit. Latest known visit with results is:  Clinical Support on 08/22/2014  Component Date Value Ref Range Status  . CA 19-9 08/22/2014 19.0  <35.0 U/mL Final     RADIOGRAPHIC STUDIES: No results found.  ASSESSMENT/PLAN:    Pancreatic adenocarcinoma Patient is status post Whipple surgery, chemotherapy, and radiation. She is currently under observation only.  CA 19-9 obtained in December 2015 was within normal limits.   Patient is scheduled to return 02/21/15 for labs and a follow up  visit.   Patient knows to call with any new worries or concerns in the interim.    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 Pt now c/o approx 10 day hx of bilat jaw pain; occasional sharp headache radiating from his jaw areas to frontal scalp area. Patient denies any URI symptoms, chest pain, chest pressure, SOB, or pain with inspiration.  Also denies any vision issues or other neuro changes.   On exam- mild tenderness with palpation to bilat TMJ areas.  Most likely, bilat jaw pain is recurrence of patient's previous TMJ.  Advised that patient continue with Ibuprofen as directed; and to follow up with her PCP if jaw pain no better.      Patient  stated understanding of all instructions; and was in agreement with this plan of care. The patient knows to call the clinic with any problems, questions or concerns.   Review/collaboration with Dr. Benay Spice regarding all aspects of patient's visit today.   Total time spent with patient was 25 minutes;  with greater than 75 percent of that time spent in face to face counseling regarding her symptoms and coordination of care and follow up.  Disclaimer: This note was dictated with voice recognition software. Similar sounding words can inadvertently be transcribed and may not be corrected upon review.   Drue Second, NP 09/25/2014

## 2014-09-26 ENCOUNTER — Telehealth: Payer: Self-pay | Admitting: *Deleted

## 2014-09-26 NOTE — Telephone Encounter (Signed)
Spoke with pt today.  Was informed that TMJ issue is much better.  Pt had been taking Ibuprofen as instructed by Jenny Reichmann, NP symptom management, and has good relief of pain.  Pt stated she did not call her PCP since the pain has eased up a lot.  Pt stated she would call PCP if pain returns again.

## 2014-11-01 ENCOUNTER — Other Ambulatory Visit: Payer: Self-pay | Admitting: Oncology

## 2014-11-01 DIAGNOSIS — C259 Malignant neoplasm of pancreas, unspecified: Secondary | ICD-10-CM

## 2015-01-26 ENCOUNTER — Encounter: Payer: Self-pay | Admitting: *Deleted

## 2015-02-21 ENCOUNTER — Ambulatory Visit: Payer: 59

## 2015-02-21 ENCOUNTER — Telehealth: Payer: Self-pay | Admitting: Oncology

## 2015-02-21 ENCOUNTER — Ambulatory Visit (HOSPITAL_BASED_OUTPATIENT_CLINIC_OR_DEPARTMENT_OTHER): Payer: 59 | Admitting: Oncology

## 2015-02-21 ENCOUNTER — Other Ambulatory Visit: Payer: 59

## 2015-02-21 VITALS — BP 166/80 | HR 60 | Temp 98.1°F | Resp 18 | Ht 62.0 in | Wt 148.9 lb

## 2015-02-21 DIAGNOSIS — Z95828 Presence of other vascular implants and grafts: Secondary | ICD-10-CM

## 2015-02-21 DIAGNOSIS — Z8507 Personal history of malignant neoplasm of pancreas: Secondary | ICD-10-CM

## 2015-02-21 DIAGNOSIS — E876 Hypokalemia: Secondary | ICD-10-CM | POA: Diagnosis not present

## 2015-02-21 DIAGNOSIS — C259 Malignant neoplasm of pancreas, unspecified: Secondary | ICD-10-CM

## 2015-02-21 DIAGNOSIS — C250 Malignant neoplasm of head of pancreas: Secondary | ICD-10-CM

## 2015-02-21 MED ORDER — SODIUM CHLORIDE 0.9 % IJ SOLN
10.0000 mL | INTRAMUSCULAR | Status: DC | PRN
  Filled 2015-02-21: qty 10

## 2015-02-21 MED ORDER — HEPARIN SOD (PORK) LOCK FLUSH 100 UNIT/ML IV SOLN
500.0000 [IU] | Freq: Once | INTRAVENOUS | Status: DC
  Filled 2015-02-21: qty 5

## 2015-02-21 NOTE — Progress Notes (Signed)
  [facility] OFFICE PROGRESS NOTE   Diagnosis: Pancreas cancer  INTERVAL HISTORY:   Ms. Dicenso returns as scheduled. She feels well. Good appetite. She is working in her yard. No diarrhea. She continues pancreatic enzyme replacement. She relates urinary frequency to drinking a lot of water.  Objective:  Vital signs in last 24 hours:  Blood pressure 166/80, pulse 60, temperature 98.1 F (36.7 C), temperature source Oral, resp. rate 18, height 5\' 2"  (1.575 m), weight 148 lb 14.4 oz (67.541 kg), SpO2 99 %.    HEENT: Neck without mass Lymphatics: No cervical, supraclavicular, axillary, or inguinal nodes Resp: Lungs clear bilaterally Cardio: Regular rate and rhythm GI: No hepatosplenomegaly, no mass, no apparent ascites, mild tenderness in the mid upper abdomen Vascular: No leg edema   Lab Results:  08/22/2014-CA 19-9: 19 Imaging:  No results found.  Medications: I have reviewed the patient's current medications.  Assessment/Plan: 1. Adenocarcinoma of the pancreas; mass in the neck of the pancreas, status post EUS guided biopsy 09/15/2012 with the cytology confirming malignant cells consistent with adenocarcinoma, T3 N0 by ultrasound. 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 Elevated CA 19-9. She began radiation and concurrent Xeloda chemotherapy on 10/19/2012, radiation completed 11/30/2012 -Whipple procedure 01/28/2013 with no residual carcinoma seen and stromal fibrosis consistent with post treatment effect.  -She began adjuvant gemcitabine 04/01/2013, completed 07/22/2013.  2. History of Pain secondary to pancreas cancer 3. Right liver cyst. Smaller on MRI 09/28/2012 compared to 11/03/2008. 4. Hypertension currently taking Cardizem 5. Hyperlipidemia. 6. Family history of pancreas cancer (paternal aunt). 7. Anorexia/weight loss. Resolved. 8. Bradycardia when here on 11/11/2012. Atenolol  was placed on hold. 9. Right leg DVT documented on a Doppler 11/26/2012-no longer on anticoagulation. 10. History of Hypokalemia. She is still taking a potassium supplement. 11. CT abdomen/pelvis 10/10/2013 done to evaluate abdominal pain. No acute abnormality identified in the abdomen and pelvis. Postsurgical changes at the pancreas. Stable liver cyst. 12. Port-A-Cath removal 07/14/2014.   Disposition:  Ms. Reine remains in clinical remission from pancreas cancer. We will follow-up on the CA 19-9 from today. She will discuss the indication for continuing pancreas enzyme replacement with Dr. Barry Dienes.  Ms. Montesano will return for an office visit in 6 months.  Betsy Coder, MD  02/21/2015  11:51 AM

## 2015-02-21 NOTE — Telephone Encounter (Signed)
Appointments made and avs pritned for patient °

## 2015-02-21 NOTE — Patient Instructions (Signed)

## 2015-02-22 LAB — CANCER ANTIGEN 19-9: CA 19 9: 20.9 U/mL (ref <35.0)

## 2015-02-23 ENCOUNTER — Telehealth: Payer: Self-pay | Admitting: *Deleted

## 2015-02-23 NOTE — Telephone Encounter (Signed)
Per Dr. Benay Spice; notified pt that ca19-9 is normal.  Pt verbalized understanding.

## 2015-02-23 NOTE — Telephone Encounter (Signed)
-----   Message from Ladell Pier, MD sent at 02/22/2015  8:30 PM EDT ----- Please call patient, ca19-9 is normal

## 2015-03-03 ENCOUNTER — Other Ambulatory Visit: Payer: Self-pay | Admitting: Oncology

## 2015-03-18 ENCOUNTER — Other Ambulatory Visit: Payer: Self-pay | Admitting: Oncology

## 2015-06-06 ENCOUNTER — Telehealth: Payer: Self-pay

## 2015-06-06 NOTE — Telephone Encounter (Signed)
Sheila Hurst states that she has been experiencing some intermittent lower back pain for ~2-3 months.   She states that it stared in the summer.  She does do a lot of yard work.  Sitting for periods of time contributes to  the  back pain. 2 Ibuprofen prn takes the discomfort away.  The pain is achy. Suggested that she call her PCP Dr. Marisue Humble since she has appointment there on the 18th.  He may be able to order x-rays prior to visit to evaluate the lower back.  If he feels she needs to be seen sooner by Dr. Benay Spice, he can contact Dr. Benay Spice and appointments can be adjusted.  Sheila Hurst is agreeable to the plan and appreciated the information.

## 2015-06-14 ENCOUNTER — Other Ambulatory Visit: Payer: Self-pay | Admitting: Family Medicine

## 2015-06-14 ENCOUNTER — Ambulatory Visit
Admission: RE | Admit: 2015-06-14 | Discharge: 2015-06-14 | Disposition: A | Discharge status: Home / Self Care | Payer: 59 | Source: Ambulatory Visit | Attending: Family Medicine | Admitting: Family Medicine

## 2015-06-14 DIAGNOSIS — Z8507 Personal history of malignant neoplasm of pancreas: Secondary | ICD-10-CM

## 2015-06-14 DIAGNOSIS — M545 Low back pain: Secondary | ICD-10-CM

## 2015-06-15 ENCOUNTER — Other Ambulatory Visit: Payer: Self-pay | Admitting: Family Medicine

## 2015-06-15 DIAGNOSIS — R748 Abnormal levels of other serum enzymes: Secondary | ICD-10-CM

## 2015-06-17 ENCOUNTER — Other Ambulatory Visit: Payer: Self-pay | Admitting: Oncology

## 2015-06-21 ENCOUNTER — Ambulatory Visit
Admission: RE | Admit: 2015-06-21 | Discharge: 2015-06-21 | Disposition: A | Discharge status: Home / Self Care | Payer: 59 | Source: Ambulatory Visit | Attending: Family Medicine | Admitting: Family Medicine

## 2015-06-21 DIAGNOSIS — R748 Abnormal levels of other serum enzymes: Secondary | ICD-10-CM

## 2015-06-22 ENCOUNTER — Other Ambulatory Visit: Payer: Self-pay

## 2015-06-22 DIAGNOSIS — Z1231 Encounter for screening mammogram for malignant neoplasm of breast: Secondary | ICD-10-CM

## 2015-07-17 ENCOUNTER — Ambulatory Visit (INDEPENDENT_AMBULATORY_CARE_PROVIDER_SITE_OTHER): Payer: Medicare Other | Admitting: Neurology

## 2015-07-17 ENCOUNTER — Encounter: Payer: Self-pay | Admitting: Neurology

## 2015-07-17 ENCOUNTER — Other Ambulatory Visit (INDEPENDENT_AMBULATORY_CARE_PROVIDER_SITE_OTHER): Payer: Medicare Other

## 2015-07-17 VITALS — BP 124/72 | HR 60 | Ht 61.0 in | Wt 146.0 lb

## 2015-07-17 DIAGNOSIS — C257 Malignant neoplasm of other parts of pancreas: Secondary | ICD-10-CM | POA: Diagnosis not present

## 2015-07-17 DIAGNOSIS — C259 Malignant neoplasm of pancreas, unspecified: Secondary | ICD-10-CM | POA: Insufficient documentation

## 2015-07-17 DIAGNOSIS — R413 Other amnesia: Secondary | ICD-10-CM | POA: Insufficient documentation

## 2015-07-17 LAB — CREATININE, SERUM: Creatinine, Ser: 1.11 mg/dL (ref 0.40–1.20)

## 2015-07-17 LAB — TSH: TSH: 1.69 u[IU]/mL (ref 0.35–4.50)

## 2015-07-17 LAB — BUN: BUN: 17 mg/dL (ref 6–23)

## 2015-07-17 NOTE — Patient Instructions (Signed)
1. Bloodwork for TSH, B12, BUN, creatinine 2. Schedule MRI brain with and without contrast 3. Control of BP, as well as physical exercise and brain stimulation exercises are important for brain health 4. Follow-up in 6 months

## 2015-07-17 NOTE — Progress Notes (Signed)
NEUROLOGY CONSULTATION NOTE  Sheila Hurst MRN: [id number] DOB: 01/02/1941  Referring provider: Dr. Gaynelle Arabian Primary care provider:  Dr. Gaynelle Arabian  Reason for consult:  Memory loss  Dear Dr Marisue Humble:  Thank you for your kind referral of Sheila Hurst for consultation of the above symptoms. Although her history is well known to you, please allow me to reiterate it for the purpose of our medical record. Records and images were personally reviewed where available.  HISTORY OF PRESENT ILLNESS: This is a pleasant 74 year old left-handed woman with a history of hypertension, pancreatic cancer s/p Whipple procedure and radiataion, in remission for the past 3 years, presenting for evaluation of worsening memory loss. She started having memory changes around a year after her surgery in January 2013, mostly with word-finding difficulties and forgetfulness. She would know what she wants to say but then leaves her. She could not recall people's names but recognizes their faces, recalling it later on. She would be in a conversation then her mind would go blank. She reports that the word eventually comes to her. She lives alone and has missed some bill payments. Her children have been helping over the past year. She used to forget her medications, but her daughter now reminds her daily and has set up a pillbox. She denies getting lost driving. She has a family history of Alzheimer's disease in her mother, maternal ant, paternal uncle and aunt. No history of significant head injuries, no alcohol use.   She has occasional mild headaches. She reports dizziness after taking her BP medications. She denies any diplopia, dysarthria, dysphagia, neck pain, focal numbness/tingling/weakness, bowel/bladder dysfunction, no anosmia or tremors. She has chronic back pain.   Laboratory Data: Lab Results  Component Value Date   WBC 3.5* 10/10/2013   HGB 14.6 07/14/2014   HCT 43.0 07/14/2014   MCV 87.7  10/10/2013   PLT 182 10/10/2013     Chemistry      Component Value Date/Time   NA 139 07/14/2014 0659   NA 142 04/25/2014 1426   K 3.8 07/14/2014 0659   K 3.5 04/25/2014 1426   CL 104 07/14/2014 0659   CL 104 10/05/2012 1234   CO2 27 04/25/2014 1426   CO2 28 10/10/2013 1920   BUN 17 07/17/2015 1402   BUN 13.9 04/25/2014 1426   CREATININE 1.11 07/17/2015 1402   CREATININE 0.9 04/25/2014 1426      Component Value Date/Time   CALCIUM 9.0 04/25/2014 1426   CALCIUM 8.9 10/10/2013 1920   ALKPHOS 141* 10/10/2013 1920   ALKPHOS 143 07/09/2013 0902   AST 22 10/10/2013 1920   AST 28 07/09/2013 0902   ALT 15 10/10/2013 1920   ALT 22 07/09/2013 0902   BILITOT 0.5 10/10/2013 1920   BILITOT 0.68 07/09/2013 0902      PAST MEDICAL HISTORY: Past Medical History  Diagnosis Date  . Hypertension   . GERD (gastroesophageal reflux disease)     no medication  . Arthritis     left knee  . Hyperlipidemia   . Pancreatic mass 09/15/2012    Head of Pancreas, malignant cells consistent w/adenocarcinoma  . Weight loss     Prior to Dx  . Bronchitis   . Radiation 10/19/12-11/30/12    50.4 gray Pancreatic Ca/abdomen  . Wears glasses   . Wears dentures     top    PAST SURGICAL HISTORY: Past Surgical History  Procedure Laterality Date  . Badder tack  09-2011  .  Total abdominal hysterectomy w/ bilateral salpingoophorectomy  1993  . Tonsillectomy      age 52  . Appendectomy  70's  . Tubal ligation  1977  . Eus  09/15/2012    Procedure: ESOPHAGEAL ENDOSCOPIC ULTRASOUND (EUS) RADIAL;  Surgeon: Arta Silence, MD;  Location: [facility];  Service: Endoscopy;  Laterality: N/A;  + or - fna unsure of what scope  . Fine needle aspiration  09/15/2012    Procedure: FINE NEEDLE ASPIRATION (FNA) LINEAR;  Surgeon: Arta Silence, MD;  Location: [facility];  Service: Endoscopy;  Laterality: N/A;  . Belpharoptosis repair    . Laparoscopy N/A 01/28/2013    Procedure: LAPAROSCOPY DIAGNOSTIC,  WHIPPLE;   Surgeon: Stark Klein, MD;  Location: [facility];  Service: General;  Laterality: N/A;  . Whipple procedure N/A 01/28/2013    Procedure: WHIPPLE PROCEDURE;  Surgeon: Stark Klein, MD;  Location: [facility];  Service: General;  Laterality: N/A;  . Portacath placement N/A 03/18/2013    Procedure: INSERTION PORT-A-CATH;  Surgeon: Stark Klein, MD;  Location: [facility];  Service: General;  Laterality: N/A;  Flouro time 72minute, 11 seconds.  . Port-a-cath removal Right 07/14/2014    Procedure: REMOVAL PORT-A-CATH;  Surgeon: Stark Klein, MD;  Location: [facility];  Service: General;  Laterality: Right;  . Cardiac catheterization  12/2006  . Pancreaticoduodenectomy  12/2012  . Cholecystectomy  12/2012    MEDICATIONS: Current Outpatient Prescriptions on File Prior to Visit  Medication Sig Dispense Refill  . betamethasone valerate ointment (VALISONE) 0.1 %     . diltiazem (CARDIZEM) 90 MG tablet Take 90 mg by mouth every morning.     . Glucosamine-Chondroit-Vit C-Mn (GLUCOSAMINE 1500 COMPLEX PO) Take 1 capsule by mouth daily.    . pantoprazole (PROTONIX) 40 MG tablet Take 1 tablet (40 mg total) by mouth daily. 30 tablet 2  . Potassium Chloride ER 20 MEQ TBCR TAKE 1 TABLET (20 MEQ TOTAL) BY MOUTH DAILY. 30 tablet 1  . valsartan (DIOVAN) 160 MG tablet Take 1 tablet by mouth every morning.     No current facility-administered medications on file prior to visit.    ALLERGIES: Allergies  Allergen Reactions  . Lisinopril Cough  . Metoprolol Other (See Comments)  . Ondansetron Rash    FAMILY HISTORY: Family History  Problem Relation Age of Onset  . Throat cancer Father   . Pancreatic cancer Paternal Aunt   . Alzheimer's disease Mother   . Alzheimer's disease Paternal Uncle   . Alzheimer's disease Maternal Aunt     SOCIAL HISTORY: Social History   Social History  . Marital Status: Widowed    Spouse Name: N/A  . Number of Children: 2  . Years of Education: N/A    Occupational History  . Retired     Therapist, art   Social History Main Topics  . Smoking status: Former Smoker -- 0.30 packs/day for 24 years    Types: Cigarettes    Quit date: 09/02/1978  . Smokeless tobacco: Never Used  . Alcohol Use: No  . Drug Use: No  . Sexual Activity: No   Other Topics Concern  . Not on file   Social History Narrative   Widowed-lives alone   1 son , 1 daughter   Retired-customer service with [employer]: Constitutional: No fevers, chills, or sweats, no generalized fatigue, change in appetite Eyes: No visual changes, double vision, eye pain Ear, nose and throat: No hearing loss, ear  pain, nasal congestion, sore throat Cardiovascular: No chest pain, palpitations Respiratory:  No shortness of breath at rest or with exertion, wheezes GastrointestinaI: No nausea, vomiting, diarrhea, abdominal pain, fecal incontinence Genitourinary:  No dysuria, urinary retention or frequency Musculoskeletal:  No neck pain, +back pain Integumentary: No rash, pruritus, skin lesions Neurological: as above Psychiatric: No depression, insomnia, anxiety Endocrine: No palpitations, fatigue, diaphoresis, mood swings, change in appetite, change in weight, increased thirst Hematologic/Lymphatic:  No anemia, purpura, petechiae. Allergic/Immunologic: no itchy/runny eyes, nasal congestion, recent allergic reactions, rashes  PHYSICAL EXAM: Filed Vitals:   07/17/15 1251  BP: 124/72  Pulse: 60   General: No acute distress Head:  Normocephalic/atraumatic Eyes: Fundoscopic exam shows bilateral sharp discs, no vessel changes, exudates, or hemorrhages Neck: supple, no paraspinal tenderness, full range of motion Back: No paraspinal tenderness Heart: regular rate and rhythm Lungs: Clear to auscultation bilaterally. Vascular: No carotid bruits. Skin/Extremities: No rash, no edema Neurological Exam: Mental status: alert and oriented to person, place,  and time, no dysarthria or aphasia, Fund of knowledge is appropriate.  Recent and remote memory are intact.  Attention and concentration are normal.    Able to name objects and repeat phrases.  MMSE - Mini Mental State Exam 07/17/2015  Orientation to time 5  Orientation to Place 5  Registration 3  Attention/ Calculation 5  Recall 2  Language- name 2 objects 2  Language- repeat 1  Language- follow 3 step command 2  Language- read & follow direction 1  Write a sentence 1  Copy design 0  Total score 27   Cranial nerves: CN I: not tested CN II: pupils equal, round and reactive to light, visual fields intact, fundi unremarkable. CN III, IV, VI:  full range of motion, no nystagmus, no ptosis CN V: facial sensation intact CN VII: upper and lower face symmetric CN VIII: hearing intact to finger rub CN IX, X: gag intact, uvula midline CN XI: sternocleidomastoid and trapezius muscles intact CN XII: tongue midline Bulk & Tone: normal, no fasciculations. Motor: 5/5 throughout with no pronator drift. Sensation: intact to light touch, cold, pin, vibration and joint position sense.  No extinction to double simultaneous stimulation.  Romberg test negative Deep Tendon Reflexes: +1 throughout, no ankle clonus Plantar responses: downgoing bilaterally Cerebellar: no incoordination on finger to nose, heel to shin. No dysdiadochokinesia Gait: narrow-based and steady, able to tandem walk adequately. Tremor: none  IMPRESSION: This is a pleasant 74 year old left-handed woman with a history of hypertension, pancreatic cancer s/p surgery and radiation, presenting for worsening memory loss. Neurological exam normal, MMSE normal 27/30. Although MMSE normal, history suggestive of mild cognitive impairment versus age-related memory changes. We discussed different causes of memory loss. Check TSH and B12. MRI brain with and without contrast will be ordered to assess for underlying structural abnormality,  particularly with history of cancer, as well as assess vascular load. No clear indication to start cholinesterase inhibitors at this time. We discussed the importance of control of vascular risk factors, physical exercise, and brain stimulation exercises for brain health. She will follow-up in 6 months.   Thank you for allowing me to participate in the care of this patient. Please do not hesitate to call for any questions or concerns.   Ellouise Newer, M.D.  CC: Dr. Marisue Humble

## 2015-07-19 ENCOUNTER — Telehealth: Payer: Self-pay | Admitting: Oncology

## 2015-07-19 NOTE — Telephone Encounter (Signed)
Due to [facility] 12/19 appointments moved to 09/18/15. Left message for patient and mailed schedule.

## 2015-07-31 ENCOUNTER — Telehealth: Payer: Self-pay | Admitting: Family Medicine

## 2015-07-31 NOTE — Telephone Encounter (Signed)
Lmovm to rtn my call. 

## 2015-07-31 NOTE — Telephone Encounter (Signed)
-----   Message from Cameron Sprang, MD sent at 07/31/2015 10:04 AM EST ----- Pls let her know bloodwork came back normal, thanks

## 2015-08-01 NOTE — Telephone Encounter (Signed)
Patient returned my call. Notified her of results. 

## 2015-08-02 ENCOUNTER — Ambulatory Visit (HOSPITAL_COMMUNITY): Payer: Medicare Other

## 2015-08-21 ENCOUNTER — Other Ambulatory Visit: Payer: 59

## 2015-08-21 ENCOUNTER — Ambulatory Visit: Payer: 59 | Admitting: Oncology

## 2015-08-21 ENCOUNTER — Ambulatory Visit (HOSPITAL_COMMUNITY)
Admission: RE | Admit: 2015-08-21 | Discharge: 2015-08-21 | Disposition: A | Discharge status: Home / Self Care | Payer: Medicare Other | Source: Ambulatory Visit | Attending: Neurology | Admitting: Neurology

## 2015-08-21 DIAGNOSIS — I10 Essential (primary) hypertension: Secondary | ICD-10-CM | POA: Diagnosis not present

## 2015-08-21 DIAGNOSIS — R413 Other amnesia: Secondary | ICD-10-CM | POA: Insufficient documentation

## 2015-08-21 DIAGNOSIS — C257 Malignant neoplasm of other parts of pancreas: Secondary | ICD-10-CM | POA: Diagnosis not present

## 2015-08-21 DIAGNOSIS — I739 Peripheral vascular disease, unspecified: Secondary | ICD-10-CM | POA: Diagnosis not present

## 2015-08-22 ENCOUNTER — Telehealth: Payer: Self-pay | Admitting: Family Medicine

## 2015-08-22 MED ORDER — GADOBENATE DIMEGLUMINE 529 MG/ML IV SOLN
14.0000 mL | Freq: Once | INTRAVENOUS | Status: AC | PRN
  Administered 2015-08-21: 14 mL via INTRAVENOUS

## 2015-08-22 NOTE — Telephone Encounter (Signed)
-----   Message from Cameron Sprang, MD sent at 08/22/2015  9:30 AM EST ----- Pls let her know I reviewed MRI brain, it is unremarkable, no evidence of tumor, stroke, or bleed. Thanks

## 2015-08-22 NOTE — Telephone Encounter (Signed)
Lmovm to rtn my call. 

## 2015-08-23 NOTE — Telephone Encounter (Signed)
Patient returned my call. I notified her of results.

## 2015-08-31 ENCOUNTER — Ambulatory Visit
Admission: RE | Admit: 2015-08-31 | Discharge: 2015-08-31 | Disposition: A | Discharge status: Home / Self Care | Payer: Medicare Other | Source: Ambulatory Visit

## 2015-08-31 DIAGNOSIS — Z1231 Encounter for screening mammogram for malignant neoplasm of breast: Secondary | ICD-10-CM

## 2015-09-15 ENCOUNTER — Other Ambulatory Visit: Payer: Self-pay | Admitting: Oncology

## 2015-09-18 ENCOUNTER — Other Ambulatory Visit (HOSPITAL_BASED_OUTPATIENT_CLINIC_OR_DEPARTMENT_OTHER): Payer: Medicare Other

## 2015-09-18 ENCOUNTER — Encounter: Payer: Self-pay | Admitting: *Deleted

## 2015-09-18 ENCOUNTER — Ambulatory Visit (HOSPITAL_BASED_OUTPATIENT_CLINIC_OR_DEPARTMENT_OTHER): Payer: Medicare Other | Admitting: Oncology

## 2015-09-18 ENCOUNTER — Telehealth: Payer: Self-pay | Admitting: Oncology

## 2015-09-18 VITALS — BP 167/68 | HR 60 | Temp 97.8°F | Resp 18 | Ht 61.0 in | Wt 144.2 lb

## 2015-09-18 DIAGNOSIS — Z8507 Personal history of malignant neoplasm of pancreas: Secondary | ICD-10-CM

## 2015-09-18 DIAGNOSIS — C259 Malignant neoplasm of pancreas, unspecified: Secondary | ICD-10-CM

## 2015-09-18 DIAGNOSIS — C250 Malignant neoplasm of head of pancreas: Secondary | ICD-10-CM

## 2015-09-18 DIAGNOSIS — E876 Hypokalemia: Secondary | ICD-10-CM

## 2015-09-18 NOTE — Progress Notes (Signed)
Oncology Nurse Navigator Documentation  Oncology Nurse Navigator Flowsheets 09/18/2015  Navigator Location [facility]  Navigator Encounter Type Follow-up Appt  Patient Visit Type MedOnc  Treatment Phase Post-Tx Follow-up--3 years out from pancreas cancer  Barriers/Navigation Needs Family concerns--asking for support group  Interventions Other-provided information regarding support group and encouraged her to attend tonight.  Support Groups/Services GI Support Group  Acuity Level 1  Time Spent with Patient 15

## 2015-09-18 NOTE — Telephone Encounter (Signed)
Gave patient avs report and appointments for July.  °

## 2015-09-18 NOTE — Progress Notes (Signed)
  [facility] OFFICE PROGRESS NOTE   Diagnosis: Pancreas cancer  INTERVAL HISTORY:   Ms. Sheila Hurst returns as scheduled. She reports feeling well. Good appetite. She saw Dr. Oletta Lamas for evaluation of a mildly elevated alkaline phosphatase. She is no longer taking pancreas enzyme replacement. She reports back pain for the past several years. She is now taking glucosamine. She has undergone a neurology evaluation for memory loss.  Objective:  Vital signs in last 24 hours:  Blood pressure 167/68, pulse 60, temperature 97.8 F (36.6 C), temperature source Oral, resp. rate 18, height 5\' 1"  (1.549 m), weight 144 lb 3.2 oz (65.409 kg), SpO2 99 %.    HEENT: Neck without mass Lymphatics: No cervical, supraclavicular, axillary, or inguinal nodes Resp: Lungs clear bilaterally Cardio: Regular rate and rhythm GI: No hepatosplenomegaly, no mass, nontender Vascular: No leg edema  Lab Results:  Lab Results  Component Value Date   WBC 3.5* 10/10/2013   HGB 14.6 07/14/2014   HCT 43.0 07/14/2014   MCV 87.7 10/10/2013   PLT 182 10/10/2013   NEUTROABS 2.2 10/10/2013     Medications: I have reviewed the patient's current medications.  Assessment/Plan: 1. Adenocarcinoma of the pancreas; mass in the neck of the pancreas, status post EUS guided biopsy 09/15/2012 with the cytology confirming malignant cells consistent with adenocarcinoma, T3 N0 by ultrasound. 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 Elevated CA 19-9. She began radiation and concurrent Xeloda chemotherapy on 10/19/2012, radiation completed 11/30/2012 -Whipple procedure 01/28/2013 with no residual carcinoma seen and stromal fibrosis consistent with post treatment effect.  -She began adjuvant gemcitabine 04/01/2013, completed 07/22/2013.  2. History of Pain secondary to pancreas cancer 3. Right liver cyst. Smaller on MRI 09/28/2012 compared to  11/03/2008. 4. Hypertension currently taking Cardizem 5. Hyperlipidemia. 6. Family history of pancreas cancer (paternal aunt). 7. Anorexia/weight loss. Resolved. 8. Bradycardia when here on 11/11/2012. Atenolol was placed on hold. 9. Right leg DVT documented on a Doppler 11/26/2012-no longer on anticoagulation. 10. History of Hypokalemia. She is still taking a potassium supplement. 11. CT abdomen/pelvis 10/10/2013 done to evaluate abdominal pain. No acute abnormality identified in the abdomen and pelvis. Postsurgical changes at the pancreas. Stable liver cyst. 12. Port-A-Cath removal 07/14/2014.    Disposition:  Sheila Hurst remains in clinical remission from pancreas cancer. We will follow-up on the CA 19-9 from today. She will return for an office visit in 6 months.  Betsy Coder, MD  09/18/2015  1:08 PM

## 2015-09-19 LAB — CANCER ANTIGEN 19-9: CA 19-9: 30 U/mL (ref 0–35)

## 2015-09-20 ENCOUNTER — Telehealth: Payer: Self-pay | Admitting: *Deleted

## 2015-09-20 NOTE — Telephone Encounter (Signed)
Per Dr. Sherrill; notified pt that ca19-9 is normal.  Pt verbalized understanding and expressed appreciation for call. 

## 2015-09-20 NOTE — Telephone Encounter (Signed)
-----   Message from Sheila Pier, MD sent at 09/19/2015  4:55 PM EST ----- Please call patient, ca19-9 is normal

## 2015-10-18 ENCOUNTER — Encounter: Payer: Self-pay | Admitting: Neurology

## 2015-10-20 ENCOUNTER — Other Ambulatory Visit: Payer: Self-pay | Admitting: Oncology

## 2015-10-25 ENCOUNTER — Telehealth: Payer: Self-pay | Admitting: Medical Oncology

## 2015-10-25 NOTE — Telephone Encounter (Signed)
Pt.notified

## 2015-10-25 NOTE — Telephone Encounter (Signed)
-----   Message from Ladell Pier, MD sent at 10/24/2015  4:59 PM EST ----- Regarding: RE: kdur refill requested 20 qd She should now get this from Dr. Marisue Humble ----- Message -----    From: Ardeen Garland, RN    Sent: 10/24/2015   4:50 PM      To: Ladell Pier, MD Subject: kdur refill requested 20 qd                    Do you want me to refill her kdur? Next lab /ov in july

## 2015-12-25 ENCOUNTER — Other Ambulatory Visit: Payer: Self-pay | Admitting: Nurse Practitioner

## 2015-12-25 ENCOUNTER — Telehealth: Payer: Self-pay | Admitting: *Deleted

## 2015-12-25 DIAGNOSIS — C250 Malignant neoplasm of head of pancreas: Secondary | ICD-10-CM

## 2015-12-25 NOTE — Telephone Encounter (Signed)
Page received that patient "in a lot of pain getting worse".  Called Mrs. Inch who request "test to determine if her cancer has returned.  Pain started to back and both sides of stomach that was relieved by ibuprofen.  For the last week, pain has increased, is consistent, wakes me up at night.  Rates pain as ten on pain scale.  I take ibuprofen with no relief."  Denies any injury, constipation or other symptoms.  Only activity is planting flowers in her yard the past week.  Return number [phone].

## 2015-12-25 NOTE — Telephone Encounter (Signed)
Return call placed to patient.  Pt can be seen tomorrow, 12/26/15 at 1:45 PM with L. Marcello Moores NP.  Pt informed to be at [facility] at 1:15PM for labs.  Pt appreciative of call and is agreeable to come in tomorrow at 1:15PM.  Pt has no other questions or concerns at this time.

## 2015-12-26 ENCOUNTER — Other Ambulatory Visit (HOSPITAL_BASED_OUTPATIENT_CLINIC_OR_DEPARTMENT_OTHER): Payer: Medicare Other

## 2015-12-26 ENCOUNTER — Telehealth: Payer: Self-pay | Admitting: Nurse Practitioner

## 2015-12-26 ENCOUNTER — Ambulatory Visit (HOSPITAL_BASED_OUTPATIENT_CLINIC_OR_DEPARTMENT_OTHER): Payer: Medicare Other | Admitting: Nurse Practitioner

## 2015-12-26 ENCOUNTER — Telehealth: Payer: Self-pay | Admitting: Oncology

## 2015-12-26 VITALS — BP 201/90 | HR 74 | Temp 98.7°F | Resp 18 | Ht 61.0 in | Wt 146.0 lb

## 2015-12-26 DIAGNOSIS — Z86718 Personal history of other venous thrombosis and embolism: Secondary | ICD-10-CM

## 2015-12-26 DIAGNOSIS — M549 Dorsalgia, unspecified: Secondary | ICD-10-CM | POA: Diagnosis not present

## 2015-12-26 DIAGNOSIS — Z8507 Personal history of malignant neoplasm of pancreas: Secondary | ICD-10-CM

## 2015-12-26 DIAGNOSIS — C250 Malignant neoplasm of head of pancreas: Secondary | ICD-10-CM

## 2015-12-26 LAB — COMPREHENSIVE METABOLIC PANEL
ALBUMIN: 3.8 g/dL (ref 3.5–5.0)
ALK PHOS: 205 U/L — AB (ref 40–150)
ALT: 19 U/L (ref 0–55)
ANION GAP: 8 meq/L (ref 3–11)
AST: 26 U/L (ref 5–34)
BILIRUBIN TOTAL: 0.42 mg/dL (ref 0.20–1.20)
BUN: 13.1 mg/dL (ref 7.0–26.0)
CALCIUM: 9.3 mg/dL (ref 8.4–10.4)
CO2: 28 mEq/L (ref 22–29)
Chloride: 107 mEq/L (ref 98–109)
Creatinine: 0.8 mg/dL (ref 0.6–1.1)
EGFR: 80 mL/min/{1.73_m2} — AB (ref >90)
Glucose: 137 mg/dl (ref 70–140)
Potassium: 3.9 mEq/L (ref 3.5–5.1)
Sodium: 142 mEq/L (ref 136–145)
TOTAL PROTEIN: 7.1 g/dL (ref 6.4–8.3)

## 2015-12-26 NOTE — Telephone Encounter (Signed)
Gave and printed appt sched and avs for pt for May...gv barium °

## 2015-12-26 NOTE — Progress Notes (Signed)
[place] OFFICE PROGRESS NOTE   Diagnosis:   Pancreas cancer  INTERVAL HISTORY:   Ms. Theroux returns prior to scheduled follow-up for evaluation of mid back pain. She reports onset of intermittent back pain in March of this year. Initially the pain was relieved with Tylenol. The pain has worsened over the past week. Ibuprofen temporarily relieves the pain. She describes the pain as "aching". The pain is similar to pain she was experiencing at the time she was diagnosed with pancreas cancer. The pain is worse when she lays flat. The pain does not radiate into her legs. No leg weakness or numbness. No bowel or bladder dysfunction. No nausea or vomiting. Appetite varies. Weight is stable.  We discussed the elevated blood pressure. She reports she forgot to take her blood pressure medication this morning.  Objective:  Vital signs in last 24 hours:  Blood pressure 201/90, pulse 74, temperature 98.7 F (37.1 C), temperature source Oral, resp. rate 18, height 5\' 1"  (1.549 m), weight 146 lb (66.225 kg), SpO2 97 %.    HEENT: No thrush or ulcers. Lymphatics: No palpable cervical, supra clavicular or axillary lymph nodes. Resp: Lungs clear bilaterally. Cardio: Regular rate and rhythm. GI: Abdomen soft and nontender. No hepatomegaly. No mass. Vascular: No leg edema. Neuro: Motor strength 5 over 5. Knee DTRs 1+, symmetric.  Musculoskeletal: Nontender over the mid to low back. Skin: No rash.   Lab Results:  Lab Results  Component Value Date   WBC 3.5* 10/10/2013   HGB 14.6 07/14/2014   HCT 43.0 07/14/2014   MCV 87.7 10/10/2013   PLT 182 10/10/2013   NEUTROABS 2.2 10/10/2013    Imaging:  No results found.  Medications: I have reviewed the patient's current medications.  Assessment/Plan: 1. Adenocarcinoma of the pancreas; mass in the neck of the pancreas, status post EUS guided biopsy 09/15/2012 with the cytology confirming malignant cells consistent with  adenocarcinoma, T3 N0 by ultrasound. 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 Elevated CA 19-9. She began radiation and concurrent Xeloda chemotherapy on 10/19/2012, radiation completed 11/30/2012 -Whipple procedure 01/28/2013 with no residual carcinoma seen and stromal fibrosis consistent with post treatment effect.  -She began adjuvant gemcitabine 04/01/2013, completed 07/22/2013.  2. History of Pain secondary to pancreas cancer 3. Right liver cyst. Smaller on MRI 09/28/2012 compared to 11/03/2008. 4. Hypertension currently taking Cardizem 5. Hyperlipidemia. 6. Family history of pancreas cancer (paternal aunt). 7. Anorexia/weight loss. Resolved. 8. Bradycardia when here on 11/11/2012. Atenolol was placed on hold. 9. Right leg DVT documented on a Doppler 11/26/2012-no longer on anticoagulation. 10. History of Hypokalemia. She is still taking a potassium supplement. 11. CT abdomen/pelvis 10/10/2013 done to evaluate abdominal pain. No acute abnormality identified in the abdomen and pelvis. Postsurgical changes at the pancreas. Stable liver cyst. 12. Port-A-Cath removal 07/14/2014.   Disposition: Ms. Grabau has a history of pancreas cancer. She presents today with a two-month history of back pain which has recently worsened. We will follow-up on the CA-19-9 from today. We referred her for CT scans of the abdomen/pelvis. We discussed the possibility that the pain could indicate recurrence of the pancreas cancer. She acknowledged that is her main concern and she is agreeable with the above plan. I offered her pain medication which she declined. She will continue Tylenol and ibuprofen as needed. She understands to contact the office immediately if the pain worsens or she develops any neurologic symptoms such  as extremity weakness/numbness, bowel/bladder dysfunction.  She is going out of town and the soonest she can do the CT scan  is 01/01/2016. We will see her in follow-up on 01/02/2016 to discuss the results. She will contact the office in the interim as outlined above or with any other problems.  Plan reviewed with Dr. Benay Spice. 25 minutes were spent face-to-face at today's visit with the majority of that time involved in counseling/coordination of care.    Ned Card ANP/GNP-BC   12/26/2015  2:25 PM

## 2015-12-26 NOTE — Telephone Encounter (Signed)
per pof to sch pt appt-per pof pt aware-per Brunetta Genera

## 2015-12-27 LAB — CANCER ANTIGEN 19-9: CA 19-9: 30 U/mL (ref 0–35)

## 2015-12-27 LAB — CANCER ANTIGEN 19-9 (PARALLEL TESTING): CA 19-9: 22 U/mL (ref <34)

## 2016-01-01 ENCOUNTER — Ambulatory Visit (HOSPITAL_COMMUNITY)
Admission: RE | Admit: 2016-01-01 | Discharge: 2016-01-01 | Disposition: A | Discharge status: Home / Self Care | Payer: Medicare Other | Source: Ambulatory Visit | Attending: Nurse Practitioner | Admitting: Nurse Practitioner

## 2016-01-01 ENCOUNTER — Encounter (HOSPITAL_COMMUNITY): Payer: Self-pay

## 2016-01-01 DIAGNOSIS — M545 Low back pain: Secondary | ICD-10-CM | POA: Insufficient documentation

## 2016-01-01 DIAGNOSIS — K439 Ventral hernia without obstruction or gangrene: Secondary | ICD-10-CM | POA: Insufficient documentation

## 2016-01-01 DIAGNOSIS — Z9889 Other specified postprocedural states: Secondary | ICD-10-CM | POA: Insufficient documentation

## 2016-01-01 DIAGNOSIS — C250 Malignant neoplasm of head of pancreas: Secondary | ICD-10-CM | POA: Diagnosis present

## 2016-01-01 MED ORDER — IOPAMIDOL (ISOVUE-300) INJECTION 61%
100.0000 mL | Freq: Once | INTRAVENOUS | Status: AC | PRN
  Administered 2016-01-01: 100 mL via INTRAVENOUS

## 2016-01-02 ENCOUNTER — Ambulatory Visit (HOSPITAL_BASED_OUTPATIENT_CLINIC_OR_DEPARTMENT_OTHER): Payer: Medicare Other | Admitting: Nurse Practitioner

## 2016-01-02 ENCOUNTER — Telehealth: Payer: Self-pay | Admitting: Oncology

## 2016-01-02 VITALS — BP 145/77 | HR 55 | Temp 98.0°F | Resp 18 | Ht 61.0 in | Wt 146.0 lb

## 2016-01-02 DIAGNOSIS — G893 Neoplasm related pain (acute) (chronic): Secondary | ICD-10-CM

## 2016-01-02 DIAGNOSIS — C257 Malignant neoplasm of other parts of pancreas: Secondary | ICD-10-CM | POA: Diagnosis not present

## 2016-01-02 DIAGNOSIS — C250 Malignant neoplasm of head of pancreas: Secondary | ICD-10-CM

## 2016-01-02 NOTE — Progress Notes (Signed)
[place] OFFICE PROGRESS NOTE   Diagnosis:  Pancreas cancer  INTERVAL HISTORY:   Sheila Hurst returns as scheduled. She was seen last week for evaluation of back pain. We referred her for a CT scan. She is seen today to discuss the results.  She is no longer having back pain. She used a heating pad for several days and noted significant improvement. The pain subsequently resolved. She has had a cough and nasal congestion for the past few days. She notes that her upper abdomen is sore from coughing. No fever or shortness of breath.  Objective:  Vital signs in last 24 hours:  Blood pressure 145/77, pulse 55, temperature 98 F (36.7 C), temperature source Oral, resp. rate 18, height 5\' 1"  (1.549 m), weight 146 lb (66.225 kg), SpO2 98 %.    Resp: Lungs clear bilaterally. Cardio: Regular rate and rhythm. GI: Abdomen soft and nontender. No hepatomegaly. No mass. Vascular: No leg edema.  Lab Results:  Lab Results  Component Value Date   WBC 3.5* 10/10/2013   HGB 14.6 07/14/2014   HCT 43.0 07/14/2014   MCV 87.7 10/10/2013   PLT 182 10/10/2013   NEUTROABS 2.2 10/10/2013    Imaging:  Ct Abdomen Pelvis W Contrast  01/01/2016  CLINICAL DATA:  Patient with worsening back pain. History of pancreatic carcinoma. EXAM: CT ABDOMEN AND PELVIS WITH CONTRAST TECHNIQUE: Multidetector CT imaging of the abdomen and pelvis was performed using the standard protocol following bolus administration of intravenous contrast. CONTRAST:  161mL ISOVUE-300 IOPAMIDOL (ISOVUE-300) INJECTION 61% COMPARISON:  CT abdomen pelvis 10/10/2013. FINDINGS: Lower chest: Normal heart size. Lung bases are clear. No pleural effusion. Hepatobiliary: Liver is normal in size and contour. Slight interval decrease in size of presumed cystic lesion within the posterior right hepatic lobe measuring 3.1 x 3.6 cm (image 16; series 2), previously 4.6 x 4.6 cm. Small amount of pneumobilia. Pancreas: Postsurgical changes  compatible with Whipple procedure. Atrophy of the pancreatic body and tail. Spleen: Unremarkable Adrenals/Urinary Tract: Adrenal glands are normal. Kidneys enhance symmetrically with contrast. No hydronephrosis. Urinary bladder is unremarkable. Stomach/Bowel: Sigmoid colonic diverticulosis. No CT evidence for acute diverticulitis. Large bowel containing ventral abdominal wall hernia without evidence for obstruction. Vascular/Lymphatic: Normal caliber abdominal aorta. Peripheral calcified atherosclerotic plaque. No retroperitoneal lymphadenopathy. Other: Patient status post hysterectomy. Musculoskeletal: Lower thoracic and lumbar spine degenerative changes. 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 Interval increase in size of large bowel containing ventral abdominal wall hernia. IMPRESSION: Interval increase in size of large bowel containing ventral abdominal wall hernia without evidence for obstruction. Postoperative changes compatible with Whipple procedure. No CT evidence to suggest localized recurrence or metastatic disease. Electronically Signed   By: Lovey Newcomer M.D.   On: 01/01/2016 16:37    Medications: I have reviewed the patient's current medications.  Assessment/Plan: 1. Adenocarcinoma of the pancreas; mass in the neck of the pancreas, status post EUS guided biopsy 09/15/2012 with the cytology confirming malignant cells consistent with adenocarcinoma, T3 N0 by ultrasound. 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 Elevated CA 19-9. She began radiation and concurrent Xeloda chemotherapy on 10/19/2012, radiation completed 11/30/2012 -Whipple procedure 01/28/2013 with no residual carcinoma seen and stromal fibrosis consistent with post treatment effect.  -She began adjuvant gemcitabine 04/01/2013, completed 07/22/2013.  -CT abdomen/pelvis 01/01/2016 showed no evidence of local recurrence or  metastatic disease. There was interval increase in the size of a ventral  abdominal wall hernia. 2. History of Pain secondary to pancreas cancer 3. Right liver cyst. Smaller on MRI 09/28/2012 compared to 11/03/2008. 4. Hypertension currently taking Cardizem 5. Hyperlipidemia. 6. Family history of pancreas cancer (paternal aunt). 7. Anorexia/weight loss. Resolved. 8. Bradycardia when here on 11/11/2012. Atenolol was placed on hold. 9. Right leg DVT documented on a Doppler 11/26/2012-no longer on anticoagulation. 10. History of Hypokalemia. She is still taking a potassium supplement. 11. CT abdomen/pelvis 10/10/2013 done to evaluate abdominal pain. No acute abnormality identified in the abdomen and pelvis. Postsurgical changes at the pancreas. Stable liver cyst. 12. Port-A-Cath removal 07/14/2014.   Disposition: Sheila Hurst appears stable. The back pain has resolved. The CT scan showed no evidence of locally recurrent or metastatic disease.  She will return for a follow-up visit and CA-19-9 in 5 months.  Plan reviewed with Dr. Benay Spice.  Ned Card ANP/GNP-BC   01/02/2016  3:13 PM

## 2016-01-02 NOTE — Telephone Encounter (Signed)
cx 7/17 appt per provider per pof.Sheila Hurst pt appt for oct & avs

## 2016-01-17 ENCOUNTER — Ambulatory Visit: Payer: Medicare Other | Admitting: Neurology

## 2016-03-18 ENCOUNTER — Ambulatory Visit: Payer: Medicare Other | Admitting: Nurse Practitioner

## 2016-03-18 ENCOUNTER — Other Ambulatory Visit: Payer: Medicare Other

## 2016-04-29 ENCOUNTER — Ambulatory Visit: Payer: Medicare Other | Admitting: Neurology

## 2016-06-06 ENCOUNTER — Other Ambulatory Visit (HOSPITAL_BASED_OUTPATIENT_CLINIC_OR_DEPARTMENT_OTHER): Payer: Medicare Other

## 2016-06-06 ENCOUNTER — Telehealth: Payer: Self-pay | Admitting: Oncology

## 2016-06-06 ENCOUNTER — Ambulatory Visit (HOSPITAL_BASED_OUTPATIENT_CLINIC_OR_DEPARTMENT_OTHER): Payer: Medicare Other | Admitting: Oncology

## 2016-06-06 VITALS — BP 181/72 | HR 52 | Temp 98.3°F | Resp 17 | Ht 61.0 in | Wt 140.1 lb

## 2016-06-06 DIAGNOSIS — R109 Unspecified abdominal pain: Secondary | ICD-10-CM | POA: Diagnosis not present

## 2016-06-06 DIAGNOSIS — C250 Malignant neoplasm of head of pancreas: Secondary | ICD-10-CM

## 2016-06-06 DIAGNOSIS — Z86718 Personal history of other venous thrombosis and embolism: Secondary | ICD-10-CM

## 2016-06-06 DIAGNOSIS — Z8507 Personal history of malignant neoplasm of pancreas: Secondary | ICD-10-CM

## 2016-06-06 LAB — COMPREHENSIVE METABOLIC PANEL
ALBUMIN: 4 g/dL (ref 3.5–5.0)
ALT: 16 U/L (ref 0–55)
AST: 25 U/L (ref 5–34)
Alkaline Phosphatase: 143 U/L (ref 40–150)
Anion Gap: 8 mEq/L (ref 3–11)
BUN: 12.8 mg/dL (ref 7.0–26.0)
CHLORIDE: 104 meq/L (ref 98–109)
CO2: 28 meq/L (ref 22–29)
Calcium: 9.7 mg/dL (ref 8.4–10.4)
Creatinine: 0.8 mg/dL (ref 0.6–1.1)
EGFR: 82 mL/min/{1.73_m2} — ABNORMAL LOW (ref >90)
GLUCOSE: 88 mg/dL (ref 70–140)
POTASSIUM: 4.2 meq/L (ref 3.5–5.1)
SODIUM: 141 meq/L (ref 136–145)
Total Bilirubin: 0.61 mg/dL (ref 0.20–1.20)
Total Protein: 7.9 g/dL (ref 6.4–8.3)

## 2016-06-06 NOTE — Telephone Encounter (Signed)
Gv pt appt for 09/11/16.

## 2016-06-06 NOTE — Progress Notes (Signed)
  [place] OFFICE PROGRESS NOTE   Diagnosis: Pancreas cancer  INTERVAL HISTORY:   Sheila Hurst returns for scheduled visit. She reports intermittent episodes of sharp pain in the left lower abdomen for the past few months. No difficulty with bowel function. No nausea or vomiting. She has pruritus at the left lower back where she had a burn from a heating pad. She had discomfort in this area months ago, but this has improved. She has noted a diminished appetite.  Objective:  Vital signs in last 24 hours:  Blood pressure (!) 181/72, pulse (!) 52, temperature 98.3 F (36.8 C), temperature source Oral, resp. rate 17, height 5\' 1"  (1.549 m), weight 140 lb 1.6 oz (63.5 kg), SpO2 100 %.    HEENT: Neck without mass Lymphatics: No cervical, supraclavicular, axillary, or inguinal nodes Resp: Lungs clear bilaterally Cardio: Regular rate and rhythm GI: No hepatomegaly, no mass, nontender Vascular: No leg edema  Skin: Hyperpigmentation at the left lower back, no mass     Lab Results:  CA 19-9 pending   Medications: I have reviewed the patient's current medications.  Assessment/Plan: 1. Adenocarcinoma of the pancreas; mass in the neck of the pancreas, status post EUS guided biopsy 09/15/2012 with the cytology confirming malignant cells consistent with adenocarcinoma, T3 N0 by ultrasound. 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 Elevated CA 19-9. She began radiation and concurrent Xeloda chemotherapy on 10/19/2012, radiation completed 11/30/2012 -Whipple procedure 01/28/2013 with no residual carcinoma seen and stromal fibrosis consistent with post treatment effect.  -She began adjuvant gemcitabine 04/01/2013, completed 07/22/2013.  -CT abdomen/pelvis 01/01/2016 showed no evidence of local recurrence or metastatic disease. There was interval increase in the size of a ventral abdominal wall  hernia. 2. History of Pain secondary to pancreas cancer 3. Right liver cyst. Smaller on MRI 09/28/2012 compared to 11/03/2008. 4. Hypertension currently taking Cardizem 5. Hyperlipidemia. 6. Family history of pancreas cancer (paternal aunt). 7. Anorexia/weight loss. Resolved. 8. Bradycardia when here on 11/11/2012. Atenolol was placed on hold. 9. Right leg DVT documented on a Doppler 11/26/2012-no longer on anticoagulation. 10. History of Hypokalemia. She is still taking a potassium supplement. 11. CT abdomen/pelvis 10/10/2013 done to evaluate abdominal pain. No acute abnormality identified in the abdomen and pelvis. Postsurgical changes at the pancreas. Stable liver cyst. 12. Port-A-Cath removal 07/14/2014.    Disposition:  Sheila Hurst remains in clinical remission from pancreas cancer. I think it is unlikely the abdominal pain is related to recurrence of pancreas cancer. A CT in May showed no evidence of recurrent disease. She will contact us for increased pain or new symptoms. Sheila Hurst will return for an office visit in 3 months when she will be 4 years out from the diagnosis of pancreas cancer.  Betsy Coder, MD  06/06/2016  11:33 AM

## 2016-06-07 ENCOUNTER — Telehealth: Payer: Self-pay

## 2016-06-07 LAB — CANCER ANTIGEN 19-9 (PARALLEL TESTING): CA 19 9: 19 U/mL (ref <34)

## 2016-06-07 LAB — CANCER ANTIGEN 19-9: CAN 19-9: 28 U/mL (ref 0–35)

## 2016-06-07 NOTE — Telephone Encounter (Signed)
-----   Message from [name], NP sent at 06/07/2016  9:00 AM EDT ----- Please let her know CA19-9 is stable in normal range.

## 2016-06-07 NOTE — Telephone Encounter (Signed)
Called and informed pt of normal ca19-9 results. Pt verbalized understanding and denies any questions or concerns at this time.

## 2016-06-14 ENCOUNTER — Telehealth: Payer: Self-pay | Admitting: *Deleted

## 2016-06-14 NOTE — Telephone Encounter (Signed)
Message from pt reporting she tried the Ensure and likes them, asking where they can be purchased. Left messge informing her she can pick them up at her local pharmacy or [facility]/ [facility] etc.

## 2016-06-17 ENCOUNTER — Telehealth: Payer: Self-pay | Admitting: Nutrition

## 2016-06-17 NOTE — Telephone Encounter (Signed)
Provided one complimentary case of Ensure Plus 

## 2016-07-29 MED ORDER — DEXAMETHASONE SODIUM PHOSPHATE 10 MG/ML IJ SOLN
INTRAMUSCULAR | Status: AC
  Filled 2016-07-29: qty 1

## 2016-07-29 MED ORDER — ONDANSETRON HCL 8 MG PO TABS
ORAL_TABLET | ORAL | Status: AC
  Filled 2016-07-29: qty 1

## 2016-07-31 ENCOUNTER — Other Ambulatory Visit: Payer: Self-pay | Admitting: Family Medicine

## 2016-07-31 DIAGNOSIS — Z1231 Encounter for screening mammogram for malignant neoplasm of breast: Secondary | ICD-10-CM

## 2016-08-19 ENCOUNTER — Telehealth: Payer: Self-pay | Admitting: *Deleted

## 2016-08-19 NOTE — Telephone Encounter (Signed)
Message left on patient's private phone to inform her of appt change.  Instructed patient to call [place] back tomorrow with any questions.

## 2016-08-19 NOTE — Telephone Encounter (Signed)
Call placed back to patient's daughter Tammi Klippel regarding her call.  Informed Bobbi that K. Curcio NP can see patient on 08/30/16 at 10:45AM.  Patient's daughter requests that patient be called for new appt time.  Will contact pt with new time.

## 2016-08-19 NOTE — Telephone Encounter (Signed)
Received call from daughter Tammi Klippel informing nurse that pt has had increased left side pain.  Stated Dr. Benay Spice was aware of side pain at pt's last office visit.  However, pt has increased pain for past few weeks.  Per [employer], pt denied shortness of breath, bowel and bladder function fine, fair appetite and drinking ok.   Pt does not have any pain meds.   Bobbi wanted to know if pt should see Dr. Benay Spice sooner than scheduled appts on 09/11/16 to evaluate increased left side pain. Bobbi's  Phone    [phone].

## 2016-08-29 ENCOUNTER — Other Ambulatory Visit: Payer: Self-pay | Admitting: *Deleted

## 2016-08-29 DIAGNOSIS — C250 Malignant neoplasm of head of pancreas: Secondary | ICD-10-CM

## 2016-08-30 ENCOUNTER — Other Ambulatory Visit (HOSPITAL_BASED_OUTPATIENT_CLINIC_OR_DEPARTMENT_OTHER): Payer: Medicare Other

## 2016-08-30 ENCOUNTER — Encounter: Payer: Self-pay | Admitting: Oncology

## 2016-08-30 ENCOUNTER — Ambulatory Visit (HOSPITAL_BASED_OUTPATIENT_CLINIC_OR_DEPARTMENT_OTHER): Payer: Medicare Other | Admitting: Oncology

## 2016-08-30 ENCOUNTER — Telehealth: Payer: Self-pay | Admitting: Oncology

## 2016-08-30 VITALS — BP 172/60 | HR 58 | Temp 98.1°F | Resp 18 | Ht 61.0 in | Wt 141.4 lb

## 2016-08-30 DIAGNOSIS — C259 Malignant neoplasm of pancreas, unspecified: Secondary | ICD-10-CM

## 2016-08-30 DIAGNOSIS — R109 Unspecified abdominal pain: Secondary | ICD-10-CM

## 2016-08-30 DIAGNOSIS — C250 Malignant neoplasm of head of pancreas: Secondary | ICD-10-CM

## 2016-08-30 DIAGNOSIS — Z8507 Personal history of malignant neoplasm of pancreas: Secondary | ICD-10-CM

## 2016-08-30 LAB — COMPREHENSIVE METABOLIC PANEL
ALT: 19 U/L (ref 0–55)
ANION GAP: 9 meq/L (ref 3–11)
AST: 24 U/L (ref 5–34)
Albumin: 3.7 g/dL (ref 3.5–5.0)
Alkaline Phosphatase: 115 U/L (ref 40–150)
BILIRUBIN TOTAL: 0.62 mg/dL (ref 0.20–1.20)
BUN: 10.7 mg/dL (ref 7.0–26.0)
CHLORIDE: 104 meq/L (ref 98–109)
CO2: 27 meq/L (ref 22–29)
Calcium: 9.2 mg/dL (ref 8.4–10.4)
Creatinine: 0.8 mg/dL (ref 0.6–1.1)
EGFR: 83 mL/min/{1.73_m2} — AB (ref >90)
Glucose: 120 mg/dl (ref 70–140)
POTASSIUM: 4 meq/L (ref 3.5–5.1)
SODIUM: 140 meq/L (ref 136–145)
Total Protein: 7.3 g/dL (ref 6.4–8.3)

## 2016-08-30 NOTE — Progress Notes (Signed)
  [place] OFFICE PROGRESS NOTE   Diagnosis: Pancreas cancer  INTERVAL HISTORY:   Ms. Corbit returns for scheduled visit. She reports intermittent episodes of sharp pain in the left lower abdomen for the past few months. She reported the same pain approximately 3 months ago. The pain has not changed or worsened. Uses Tylenol on as-needed basis which helps her pain. She thinks her pain is due to her arthritis. No difficulty with bowel function. No nausea or vomiting. Appetite has been good. Weight remains stable. Drinks 2 cans of ensure per day.  Objective:  Vital signs in last 24 hours:  Blood pressure (!) 172/60, pulse (!) 58, temperature 98.1 F (36.7 C), temperature source Oral, resp. rate 18, height 5\' 1"  (1.549 m), weight 141 lb 6.4 oz (64.1 kg), SpO2 98 %.    HEENT: Neck without mass Lymphatics: No cervical, supraclavicular, axillary, or inguinal nodes Resp: Lungs clear bilaterally Cardio: Regular rate and rhythm GI: No hepatomegaly, no mass, nontender Vascular: No leg edema  Skin: Hyperpigmentation at the left lower back, no mass     Lab Results:  CA 19-9 pending   Medications: I have reviewed the patient's current medications.  Assessment/Plan: 1. Adenocarcinoma of the pancreas; mass in the neck of the pancreas, status post EUS guided biopsy 09/15/2012 with the cytology confirming malignant cells consistent with adenocarcinoma, T3 N0 by ultrasound. 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 Elevated CA 19-9. She began radiation and concurrent Xeloda chemotherapy on 10/19/2012, radiation completed 11/30/2012 -Whipple procedure 01/28/2013 with no residual carcinoma seen and stromal fibrosis consistent with post treatment effect.  -She began adjuvant gemcitabine 04/01/2013, completed 07/22/2013.  -CT abdomen/pelvis 01/01/2016 showed no evidence of local recurrence or metastatic disease.  There was interval increase in the size of a ventral abdominal wall hernia. 2. History of Pain secondary to pancreas cancer 3. Right liver cyst. Smaller on MRI 09/28/2012 compared to 11/03/2008. 4. Hypertension currently taking Cardizem 5. Hyperlipidemia. 6. Family history of pancreas cancer (paternal aunt). 7. Anorexia/weight loss. Resolved. 8. Bradycardia when here on 11/11/2012. Atenolol was placed on hold. 9. Right leg DVT documented on a Doppler 11/26/2012-no longer on anticoagulation. 10. History of Hypokalemia. She is still taking a potassium supplement. 11. CT abdomen/pelvis 10/10/2013 done to evaluate abdominal pain. No acute abnormality identified in the abdomen and pelvis. Postsurgical changes at the pancreas. Stable liver cyst. 12. Port-A-Cath removal 07/14/2014.    Disposition:  Ms. Reome remains in clinical remission from pancreas cancer. I think it is unlikely the abdominal pain is related to recurrence of pancreas cancer. A CT in May showed no evidence of recurrent disease. She will contact us for increased pain or new symptoms. Ms. Jou will return for an office visit in 4 months. She is now 4 years out from the diagnosis of pancreas cancer.  Plan reviewed with Dr. Benay Spice.  Mikey Bussing, NP  08/30/2016  12:48 PM

## 2016-08-30 NOTE — Telephone Encounter (Signed)
Appointments scheduled per 12/29 LOS. Patient given AVS report and calendars with future scheduled appointments. °

## 2016-08-31 LAB — CANCER ANTIGEN 19-9: CA 19-9: 25 U/mL (ref 0–35)

## 2016-09-05 ENCOUNTER — Ambulatory Visit: Payer: Medicare Other

## 2016-09-11 ENCOUNTER — Telehealth: Payer: Self-pay | Admitting: *Deleted

## 2016-09-11 ENCOUNTER — Ambulatory Visit: Payer: Medicare Other | Admitting: Nurse Practitioner

## 2016-09-11 NOTE — Telephone Encounter (Signed)
Call received from patient to get her CA 19-9 results from 08/30/16.  Results given to patient and patient appreciative of results and has no questions at this time.

## 2016-10-21 ENCOUNTER — Ambulatory Visit
Admission: RE | Admit: 2016-10-21 | Discharge: 2016-10-21 | Disposition: A | Discharge status: Home / Self Care | Payer: Medicare Other | Source: Ambulatory Visit | Attending: Family Medicine | Admitting: Family Medicine

## 2016-10-21 DIAGNOSIS — Z1231 Encounter for screening mammogram for malignant neoplasm of breast: Secondary | ICD-10-CM

## 2016-12-31 ENCOUNTER — Telehealth: Payer: Self-pay | Admitting: Oncology

## 2016-12-31 ENCOUNTER — Other Ambulatory Visit (HOSPITAL_BASED_OUTPATIENT_CLINIC_OR_DEPARTMENT_OTHER): Payer: Medicare Other

## 2016-12-31 ENCOUNTER — Ambulatory Visit (HOSPITAL_BASED_OUTPATIENT_CLINIC_OR_DEPARTMENT_OTHER): Payer: Medicare Other | Admitting: Oncology

## 2016-12-31 VITALS — BP 154/74 | HR 52 | Temp 97.7°F | Resp 17 | Ht 61.0 in | Wt 138.7 lb

## 2016-12-31 DIAGNOSIS — Z8507 Personal history of malignant neoplasm of pancreas: Secondary | ICD-10-CM

## 2016-12-31 DIAGNOSIS — C259 Malignant neoplasm of pancreas, unspecified: Secondary | ICD-10-CM

## 2016-12-31 LAB — COMPREHENSIVE METABOLIC PANEL
ALK PHOS: 129 U/L (ref 40–150)
ALT: 22 U/L (ref 0–55)
ANION GAP: 9 meq/L (ref 3–11)
AST: 28 U/L (ref 5–34)
Albumin: 4 g/dL (ref 3.5–5.0)
BILIRUBIN TOTAL: 0.49 mg/dL (ref 0.20–1.20)
BUN: 11.1 mg/dL (ref 7.0–26.0)
CALCIUM: 9.9 mg/dL (ref 8.4–10.4)
CO2: 29 mEq/L (ref 22–29)
CREATININE: 0.9 mg/dL (ref 0.6–1.1)
Chloride: 104 mEq/L (ref 98–109)
EGFR: 72 mL/min/{1.73_m2} — ABNORMAL LOW (ref >90)
Glucose: 135 mg/dl (ref 70–140)
Potassium: 4.1 mEq/L (ref 3.5–5.1)
Sodium: 142 mEq/L (ref 136–145)
TOTAL PROTEIN: 7.9 g/dL (ref 6.4–8.3)

## 2016-12-31 NOTE — Telephone Encounter (Signed)
Gave patient avs report and appointments for November.  °

## 2016-12-31 NOTE — Progress Notes (Signed)
  [place] OFFICE PROGRESS NOTE   Diagnosis: Pancreas cancer  INTERVAL HISTORY:   [facility] Hurst returns for scheduled visit. She was seen in December with several months of left-sided abdominal pain. The pain has resolved. She had the "flu "last month. These symptoms have improved. Her appetite is improving after resolution of the flulike illness. No complaint today.  Objective:  Vital signs in last 24 hours:  Blood pressure (!) 154/74, pulse (!) 52, temperature 97.7 F (36.5 C), temperature source Oral, resp. rate 17, height 5\' 1"  (1.549 m), weight 138 lb 11.2 oz (62.9 kg), SpO2 100 %.    HEENT: Neck without mass Lymphatics: No cervical, supraclavicular, or inguinal nodes, 1 m soft mobile bilateral axillary node versus prominent fat pads. Resp: Distant breath sounds, no respiratory distress Cardio: Regular rate and rhythm GI: No hepatosplenomegaly, no mass, nontender Vascular: No leg edema  Lab Results: CA 19-9 pending  Medications: I have reviewed the patient's current medications.  Assessment/Plan: 1. Adenocarcinoma of the pancreas; mass in the neck of the pancreas, status post EUS guided biopsy 09/15/2012 with the cytology confirming malignant cells consistent with adenocarcinoma, T3 N0 by ultrasound. 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 Elevated CA 19-9. She began radiation and concurrent Xeloda chemotherapy on 10/19/2012, radiation completed 11/30/2012 -Whipple procedure 01/28/2013 with no residual carcinoma seen and stromal fibrosis consistent with post treatment effect.  -She began adjuvant gemcitabine 04/01/2013, completed 07/22/2013.  -CT abdomen/pelvis 01/01/2016 showed no evidence of local recurrence or metastatic disease. There was interval increase in the size of a ventral abdominal wall hernia. 2. History of Pain secondary to pancreas cancer 3. Right liver cyst. Smaller on MRI  09/28/2012 compared to 11/03/2008. 4. Hypertension  5. Hyperlipidemia. 6. Family history of pancreas cancer (paternal aunt). 7. Anorexia/weight loss. Resolved. 8. Bradycardia when here on 11/11/2012. Atenolol was placed on hold. 9. Right leg DVT documented on a Doppler 11/26/2012-no longer on anticoagulation. 10. History of Hypokalemia. She is still taking a potassium supplement. 11. CT abdomen/pelvis 10/10/2013 done to evaluate abdominal pain. No acute abnormality identified in the abdomen and pelvis. Postsurgical changes at the pancreas. Stable liver cyst. 12. Port-A-Cath removal 07/14/2014.    Disposition:  Sheila Hurst remains in clinical remission from pancreas cancer. We will follow-up on the CA 19-9 from today. She will return for an office and lab visit in 6 months.  15 minutes were spent with the patient today. The majority of the time was used for counseling and coordination of care.  Betsy Coder, MD  12/31/2016  2:30 PM

## 2017-01-01 LAB — CANCER ANTIGEN 19-9: CA 19-9: 37 U/mL — ABNORMAL HIGH (ref 0–35)

## 2017-01-02 ENCOUNTER — Telehealth: Payer: Self-pay | Admitting: *Deleted

## 2017-01-02 DIAGNOSIS — C250 Malignant neoplasm of head of pancreas: Secondary | ICD-10-CM

## 2017-01-02 NOTE — Telephone Encounter (Signed)
-----   Message from Ladell Pier, MD sent at 01/01/2017  7:43 PM EDT ----- Regarding: RE: CA 19-9 ca19-9 at upper end of normal range, not significantly changed over past 6 months, repeat in 3 months as precaution ----- Message ----- From: San Morelle, RN Sent: 01/01/2017   4:04 PM To: Brien Few, RN, Ladell Pier, MD Subject: CA 19-9                                        Patient is calling to get her CA 19-9 results.  Any other information we need to give her besides the results?  Thanks! Lorriane Shire

## 2017-01-03 NOTE — Telephone Encounter (Signed)
s/w pt re attached message. inbasket sent for lab in early August.

## 2017-02-17 ENCOUNTER — Telehealth: Payer: Self-pay | Admitting: *Deleted

## 2017-02-17 NOTE — Telephone Encounter (Signed)
Message received from patient stating that she is continuing to have back aches and would like to be seen by Dr. Benay Spice as soon as possible.  Call placed back to patient to notify her that Dr. Benay Spice can see her on Thursday, 02/20/17 at 12:00PM.  Patient appreciative of call back and states that she will be here on 02/20/17 at noon to see Dr. Benay Spice.

## 2017-02-20 ENCOUNTER — Ambulatory Visit (HOSPITAL_BASED_OUTPATIENT_CLINIC_OR_DEPARTMENT_OTHER): Payer: Medicare Other | Admitting: Oncology

## 2017-02-20 VITALS — BP 181/72 | HR 60 | Temp 98.4°F | Resp 18 | Ht 61.0 in | Wt 140.0 lb

## 2017-02-20 DIAGNOSIS — Z8507 Personal history of malignant neoplasm of pancreas: Secondary | ICD-10-CM | POA: Diagnosis not present

## 2017-02-20 DIAGNOSIS — C250 Malignant neoplasm of head of pancreas: Secondary | ICD-10-CM

## 2017-02-20 DIAGNOSIS — M545 Low back pain: Secondary | ICD-10-CM | POA: Diagnosis not present

## 2017-02-20 NOTE — Progress Notes (Signed)
  [place] OFFICE PROGRESS NOTE   Diagnosis: Pancreas cancer  INTERVAL HISTORY:   [name] returns prior to a scheduled visit. She has discomfort at the left lower back. The pain is relieved with Tylenol. No other complaint. The pain is intermittent. No associated symptoms.  Objective:  Vital signs in last 24 hours:  Blood pressure (!) 181/72, pulse 60, temperature 98.4 F (36.9 C), temperature source Oral, resp. rate 18, height 5\' 1"  (1.549 m), weight 140 lb (63.5 kg), SpO2 96 %.    HEENT: Neck without mass Lymphatics: No cervical, supra-clavicular, axillary, or inguinal nodes Resp: Lungs clear bilaterally Cardio: Regular rate and rhythm GI: No hepatosplenomegaly, no mass, nontender, reducible ventral hernia Vascular: No leg edema Musculoskeletal: The area of discomfort as at the upper/lateral aspect of the left posterior iliac. No mass or rash.    Labs: CA 19-9 on 01/01/1999 18-37  Medications: I have reviewed the patient's current medications.  Assessment/Plan: 1. Adenocarcinoma of the pancreas; mass in the neck of the pancreas, status post EUS guided biopsy 09/15/2012 with the cytology confirming malignant cells consistent with adenocarcinoma, T3 N0 by ultrasound. 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 Elevated CA 19-9. She began radiation and concurrent Xeloda chemotherapy on 10/19/2012, radiation completed 11/30/2012 -Whipple procedure 01/28/2013 with no residual carcinoma seen and stromal fibrosis consistent with post treatment effect.  -She began adjuvant gemcitabine 04/01/2013, completed 07/22/2013.  -CT abdomen/pelvis 01/01/2016 showed no evidence of local recurrence or metastatic disease. There was interval increase in the size of a ventral abdominal wall hernia. 2. History of Pain secondary to pancreas cancer 3. Right liver cyst. Smaller on MRI 09/28/2012 compared to  11/03/2008. 4. Hypertension  5. Hyperlipidemia. 6. Family history of pancreas cancer (paternal aunt). 7. Anorexia/weight loss. Resolved. 8. Bradycardia when here on 11/11/2012. Atenolol was placed on hold. 9. Right leg DVT documented on a Doppler 11/26/2012-no longer on anticoagulation. 10. History of Hypokalemia. She is still taking a potassium supplement. 11. CT abdomen/pelvis 10/10/2013 done to evaluate abdominal pain. No acute abnormality identified in the abdomen and pelvis. Postsurgical changes at the pancreas. Stable liver cyst. 12. Port-A-Cath removal 07/14/2014.   Disposition:  Sheila Hurst presents for an unscheduled visit. She complains of pain in the left lower back/iliac area. The pain is not severe and is relieved with Tylenol. There is no other evidence of progressive pancreas cancer. I have a low clinical suspicion for progressive cancer. She will contact us for increased pain.  Sheila Hurst will return for an office visit and CA 19-9 on 04/08/2017.  15 minutes were spent with the patient today. The majority of the time was used for counseling and coordination of care.  Donneta Romberg, MD  02/20/2017  1:18 PM

## 2017-04-08 ENCOUNTER — Other Ambulatory Visit (HOSPITAL_BASED_OUTPATIENT_CLINIC_OR_DEPARTMENT_OTHER): Payer: Medicare Other

## 2017-04-08 DIAGNOSIS — C250 Malignant neoplasm of head of pancreas: Secondary | ICD-10-CM | POA: Diagnosis not present

## 2017-04-09 LAB — CANCER ANTIGEN 19-9: CA 19-9: 26 U/mL (ref 0–35)

## 2017-04-11 ENCOUNTER — Telehealth: Payer: Self-pay

## 2017-04-11 NOTE — Telephone Encounter (Signed)
Pt called for lab ca 19.9. Gave her value and confirmed November appt. Pt wrote it down.

## 2017-04-23 ENCOUNTER — Telehealth: Payer: Self-pay | Admitting: Oncology

## 2017-04-23 NOTE — Telephone Encounter (Signed)
Rescheduled day for November patient was informed

## 2017-07-04 ENCOUNTER — Ambulatory Visit: Payer: Medicare Other | Admitting: Oncology

## 2017-07-04 ENCOUNTER — Other Ambulatory Visit: Payer: Medicare Other

## 2017-07-07 ENCOUNTER — Telehealth: Payer: Self-pay

## 2017-07-07 ENCOUNTER — Other Ambulatory Visit (HOSPITAL_BASED_OUTPATIENT_CLINIC_OR_DEPARTMENT_OTHER): Payer: Medicare Other

## 2017-07-07 ENCOUNTER — Ambulatory Visit (HOSPITAL_BASED_OUTPATIENT_CLINIC_OR_DEPARTMENT_OTHER): Payer: Medicare Other | Admitting: Oncology

## 2017-07-07 VITALS — BP 151/84 | HR 54 | Temp 97.7°F | Resp 17 | Ht 61.0 in | Wt 138.4 lb

## 2017-07-07 DIAGNOSIS — Z8507 Personal history of malignant neoplasm of pancreas: Secondary | ICD-10-CM

## 2017-07-07 DIAGNOSIS — Z86718 Personal history of other venous thrombosis and embolism: Secondary | ICD-10-CM

## 2017-07-07 DIAGNOSIS — C250 Malignant neoplasm of head of pancreas: Secondary | ICD-10-CM

## 2017-07-07 DIAGNOSIS — K8681 Exocrine pancreatic insufficiency: Secondary | ICD-10-CM

## 2017-07-07 DIAGNOSIS — C259 Malignant neoplasm of pancreas, unspecified: Secondary | ICD-10-CM

## 2017-07-07 NOTE — Telephone Encounter (Signed)
Printed avs and calender of upcoming appointment in May. Per 11/5 los

## 2017-07-07 NOTE — Progress Notes (Signed)
  [facility]   Diagnosis: Pancreas cancer  INTERVAL HISTORY:   Sheila Hurst returns as scheduled.  She feels well.  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  Good appetite.  Objective:  Vital signs in last 24 hours:  Blood pressure (!) 151/84, pulse (!) 54, temperature 97.7 F (36.5 C), temperature source Oral, resp. rate 17, height 5\' 1"  (1.549 m), weight 138 lb 6.4 oz (62.8 kg), SpO2 100 %.    HEENT: Neck without mass, mild tenderness at the TMJ bilaterally Lymphatics: No cervical, supraclavicular, or inguinal nodes Resp: Lungs clear bilaterally Cardio: Regular rate and rhythm GI: No hepatosplenomegaly, no mass, nontender Vascular: No leg edema  Lab Results:  CA 19-9 on 04/09/1999 18-26   Medications: I have reviewed the patient's current medications.  Assessment/Plan: 1. Adenocarcinoma of the pancreas; mass in the neck of the pancreas, status post EUS guided biopsy 09/15/2012 with the cytology confirming malignant cells consistent with adenocarcinoma, T3 N0 by ultrasound. 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 Elevated CA 19-9. She began radiation and concurrent Xeloda chemotherapy on 10/19/2012, radiation completed 11/30/2012 -Whipple procedure 01/28/2013 with no residual carcinoma seen and stromal fibrosis consistent with post treatment effect.  -She began adjuvant gemcitabine 04/01/2013, completed 07/22/2013.  -CT abdomen/pelvis 01/01/2016 showed no evidence of local recurrence or metastatic disease. There was interval increase in the size of a ventral abdominal wall hernia. 2. History of Pain secondary to pancreas cancer 3. Right liver cyst. Smaller on MRI 09/28/2012 compared to 11/03/2008. 4. Hypertension  5. Hyperlipidemia. 6. Family history of pancreas cancer (paternal aunt). 7. Anorexia/weight loss. Resolved. 8. Bradycardia when here on  11/11/2012. Atenolol was placed on hold. 9. Right leg DVT documented on a Doppler 11/26/2012-no longer on anticoagulation. 10. History of Hypokalemia. She is still taking a potassium supplement. 11. CT abdomen/pelvis 10/10/2013 done to evaluate abdominal pain. No acute abnormality identified in the abdomen and pelvis. Postsurgical changes at the pancreas. Stable liver cyst. 12. Port-A-Cath removal 07/14/2014.   Disposition:  Sheila Hurst remains in clinical remission from pancreas cancer.  We will follow-up on the CA 19-9 from today.  She will return for an office visit in 6 months.  15 minutes were spent with the patient today.  The majority of the time was used for counseling and coordination of care.  Donneta Romberg, MD  07/07/2017  3:18 PM

## 2017-07-08 ENCOUNTER — Telehealth: Payer: Self-pay | Admitting: *Deleted

## 2017-07-08 LAB — CANCER ANTIGEN 19-9: CA 19-9: 27 U/mL (ref 0–35)

## 2017-07-08 NOTE — Telephone Encounter (Signed)
-----   Message from Ladell Pier, MD sent at 07/08/2017  8:05 AM EST ----- Please call patient, ca19-9 is normal

## 2017-07-08 NOTE — Telephone Encounter (Signed)
Notified pt of normal tumor marker. She voiced understanding.

## 2017-09-16 ENCOUNTER — Other Ambulatory Visit: Payer: Self-pay | Admitting: Family Medicine

## 2017-09-16 DIAGNOSIS — Z1231 Encounter for screening mammogram for malignant neoplasm of breast: Secondary | ICD-10-CM

## 2017-10-23 ENCOUNTER — Ambulatory Visit
Admission: RE | Admit: 2017-10-23 | Discharge: 2017-10-23 | Disposition: A | Discharge status: Home / Self Care | Payer: Medicare Other | Source: Ambulatory Visit | Attending: Family Medicine | Admitting: Family Medicine

## 2017-10-23 DIAGNOSIS — Z1231 Encounter for screening mammogram for malignant neoplasm of breast: Secondary | ICD-10-CM

## 2017-12-04 ENCOUNTER — Telehealth: Payer: Self-pay

## 2017-12-04 NOTE — Telephone Encounter (Signed)
Patient was a walk in per 4/4.  She said spoke with Benay Spice and he okayed her to schedule appointment.

## 2017-12-05 ENCOUNTER — Telehealth: Payer: Self-pay

## 2017-12-05 ENCOUNTER — Inpatient Hospital Stay: Payer: Medicare Other

## 2017-12-05 ENCOUNTER — Inpatient Hospital Stay: Payer: Medicare Other | Attending: Oncology | Admitting: Oncology

## 2017-12-05 VITALS — BP 176/81 | HR 61 | Temp 97.7°F | Resp 17 | Ht 61.0 in | Wt 139.6 lb

## 2017-12-05 DIAGNOSIS — Z800 Family history of malignant neoplasm of digestive organs: Secondary | ICD-10-CM | POA: Diagnosis not present

## 2017-12-05 DIAGNOSIS — R05 Cough: Secondary | ICD-10-CM

## 2017-12-05 DIAGNOSIS — C250 Malignant neoplasm of head of pancreas: Secondary | ICD-10-CM

## 2017-12-05 DIAGNOSIS — Z923 Personal history of irradiation: Secondary | ICD-10-CM | POA: Insufficient documentation

## 2017-12-05 DIAGNOSIS — Z86718 Personal history of other venous thrombosis and embolism: Secondary | ICD-10-CM | POA: Insufficient documentation

## 2017-12-05 DIAGNOSIS — Z8507 Personal history of malignant neoplasm of pancreas: Secondary | ICD-10-CM | POA: Diagnosis not present

## 2017-12-05 DIAGNOSIS — K8681 Exocrine pancreatic insufficiency: Secondary | ICD-10-CM

## 2017-12-05 DIAGNOSIS — R0789 Other chest pain: Secondary | ICD-10-CM | POA: Diagnosis not present

## 2017-12-05 NOTE — Progress Notes (Signed)
  [place] OFFICE PROGRESS NOTE   Diagnosis: Pancreas cancer  INTERVAL HISTORY:   Sheila Hurst returns prior to a scheduled visit.  She had an upper respiratory infection approximately 2 weeks ago with an associated cough.  Since that time she has noted intermittent discomfort and soreness at the low anterior chest wall.  No other complaint.  The cough has improved.  Objective:  Vital signs in last 24 hours:  Blood pressure (!) 176/81, pulse 61, temperature 97.7 F (36.5 C), temperature source Oral, resp. rate 17, height 5\' 1"  (1.549 m), weight 139 lb 9.6 oz (63.3 kg), SpO2 99 %.    HEENT: Neck without mass Lymphatics: No cervical, supraclavicular, axillary, or inguinal nodes Resp: Distant breath sounds, lungs clear bilaterally, no respiratory distress Cardio: Regular rate and rhythm GI: No hepatomegaly, no mass, nontender Vascular: No leg edema Musculoskeletal: Mild tenderness at the medial anterior costal margin bilaterally.  No mass or rash.    Lab Results: CA 19-9 pending    Medications: I have reviewed the patient's current medications.   Assessment/Plan: 1. Adenocarcinoma of the pancreas; mass in the neck of the pancreas, status post EUS guided biopsy 09/15/2012 with the cytology confirming malignant cells consistent with adenocarcinoma, T3 N0 by ultrasound. 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 Elevated CA 19-9. She began radiation and concurrent Xeloda chemotherapy on 10/19/2012, radiation completed 11/30/2012 -Whipple procedure 01/28/2013 with no residual carcinoma seen and stromal fibrosis consistent with post treatment effect.  -She began adjuvant gemcitabine 04/01/2013, completed 07/22/2013.  -CT abdomen/pelvis 01/01/2016 showed no evidence of local recurrence or metastatic disease. There was interval increase in the size of a ventral abdominal wall hernia. 2. History of Pain  secondary to pancreas cancer 3. Right liver cyst. Smaller on MRI 09/28/2012 compared to 11/03/2008. 4. Hypertension  5. Hyperlipidemia. 6. Family history of pancreas cancer (paternal aunt). 7. Anorexia/weight loss. Resolved. 8. Bradycardia when here on 11/11/2012. Atenolol was placed on hold. 9. Right leg DVT documented on a Doppler 11/26/2012-no longer on anticoagulation. 10. History of Hypokalemia. She is still taking a potassium supplement. 11. CT abdomen/pelvis 10/10/2013 done to evaluate abdominal pain. No acute abnormality identified in the abdomen and pelvis. Postsurgical changes at the pancreas. Stable liver cyst. 12. Port-A-Cath removal 07/14/2014.   Disposition: Sheila Hurst remains in clinical remission from pancreas cancer.  She is now greater than 5 years out from diagnosis.  We will follow-up on the CA 19-9 from today. The anterior chest wall discomfort is likely related to a benign musculoskeletal condition, potentially strain of the ribs/muscles from coughing.  She will contact us for persistent pain.  Sheila Hurst will return for an office visit in 8 months.  15 minutes were spent with the patient today.  The majority of the time was used for counseling and coordination of care.  Betsy Coder, MD  12/05/2017  9:04 AM

## 2017-12-05 NOTE — Telephone Encounter (Signed)
Printed avs and calender of upcoming appointment. Per 4/5 los 

## 2017-12-06 LAB — CANCER ANTIGEN 19-9: CAN 19-9: 29 U/mL (ref 0–35)

## 2017-12-09 ENCOUNTER — Telehealth: Payer: Self-pay | Admitting: Emergency Medicine

## 2017-12-09 NOTE — Telephone Encounter (Addendum)
Called pt in regards to lab results. Left patient a voice mail message to call back.  ----- Message from Ladell Pier, MD sent at 12/06/2017  7:31 PM EDT ----- Please call patient, ca19-9 is better

## 2017-12-16 ENCOUNTER — Telehealth: Payer: Self-pay

## 2017-12-16 NOTE — Telephone Encounter (Signed)
Call from pt inquiring on results of CA19-9. Informed pt that per Dr. Benay Spice, CA19-9 looks better. Pt voiced understanding.

## 2018-01-12 ENCOUNTER — Other Ambulatory Visit: Payer: Self-pay | Admitting: *Deleted

## 2018-01-12 ENCOUNTER — Telehealth: Payer: Self-pay | Admitting: *Deleted

## 2018-01-12 NOTE — Telephone Encounter (Signed)
Spoke with pt, she understands 5/14 appointment will be canceled. She was seen last month.

## 2018-01-13 ENCOUNTER — Other Ambulatory Visit: Payer: Medicare Other

## 2018-01-13 ENCOUNTER — Ambulatory Visit: Payer: Medicare Other | Admitting: Nurse Practitioner

## 2018-08-07 ENCOUNTER — Inpatient Hospital Stay: Payer: Medicare Other | Attending: Oncology | Admitting: Oncology

## 2018-08-07 ENCOUNTER — Telehealth: Payer: Self-pay

## 2018-08-07 VITALS — BP 180/82 | HR 73 | Temp 97.8°F | Resp 18 | Ht 61.0 in | Wt 141.3 lb

## 2018-08-07 DIAGNOSIS — Z9221 Personal history of antineoplastic chemotherapy: Secondary | ICD-10-CM | POA: Diagnosis not present

## 2018-08-07 DIAGNOSIS — C250 Malignant neoplasm of head of pancreas: Secondary | ICD-10-CM

## 2018-08-07 DIAGNOSIS — Z800 Family history of malignant neoplasm of digestive organs: Secondary | ICD-10-CM | POA: Diagnosis not present

## 2018-08-07 DIAGNOSIS — Z923 Personal history of irradiation: Secondary | ICD-10-CM | POA: Insufficient documentation

## 2018-08-07 DIAGNOSIS — Z8507 Personal history of malignant neoplasm of pancreas: Secondary | ICD-10-CM | POA: Diagnosis present

## 2018-08-07 DIAGNOSIS — Z86718 Personal history of other venous thrombosis and embolism: Secondary | ICD-10-CM | POA: Insufficient documentation

## 2018-08-07 DIAGNOSIS — I10 Essential (primary) hypertension: Secondary | ICD-10-CM | POA: Insufficient documentation

## 2018-08-07 NOTE — Progress Notes (Signed)
  [place] OFF[facility] PROGRESS NOTE   Diagnosis: Pancreas cancer  INTERVAL HISTORY:   Sheila Hurst returns as scheduled.  She feels well.  No pain.  Good appetite.  She is taking multiple blood pressure medications.  Objective:  Vital signs in last 24 hours:  Blood pressure (!) 201/78, pulse 73, temperature 97.8 F (36.6 C), temperature source Oral, resp. rate 18, height 5\' 1"  (1.549 m), weight 141 lb 4.8 oz (64.1 kg), SpO2 100 %.    HEENT: Neck without mass Lymphatics: No cervical, supraclavicular, or inguinal nodes.  Soft mobile 1/2-1 cm bilateral high axillary nodes Resp: Distant breath sounds, no respiratory distress Cardio: Regular rate and rhythm GI: No hepatosplenomegaly, no mass, nontender Vascular: No leg edema   Lab Results:  CA 19-9 on 12/05/2017: 29 Medications: I have reviewed the patient's current medications.   Assessment/Plan: 1. Adenocarcinoma of the pancreas; mass in the neck of the pancreas, status post EUS guided biopsy 09/15/2012 with the cytology confirming malignant cells consistent with adenocarcinoma, T3 N0 by ultrasound. 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 Elevated CA 19-9. She began radiation and concurrent Xeloda chemotherapy on 10/19/2012, radiation completed 11/30/2012 -Whipple procedure 01/28/2013 with no residual carcinoma seen and stromal fibrosis consistent with post treatment effect.  -She began adjuvant gemcitabine 04/01/2013, completed 07/22/2013.  -CT abdomen/pelvis 01/01/2016 showed no evidence of local recurrence or metastatic disease. There was interval increase in the size of a ventral abdominal wall hernia. 2. History of Pain secondary to pancreas cancer 3. Right liver cyst. Smaller on MRI 09/28/2012 compared to 11/03/2008. 4. Hypertension  5. Hyperlipidemia. 6. Family history of pancreas cancer (paternal aunt). 7. Anorexia/weight loss.  Resolved. 8. Bradycardia when here on 11/11/2012. Atenolol was placed on hold. 9. Right leg DVT documented on a Doppler 11/26/2012-no longer on anticoagulation. 10. History of Hypokalemia. She is still taking a potassium supplement. 11. CT abdomen/pelvis 10/10/2013 done to evaluate abdominal pain. No acute abnormality identified in the abdomen and pelvis. Postsurgical changes at the pancreas. Stable liver cyst. 12. Port-A-Cath removal 07/14/2014   Disposition: Sheila Hurst remains in clinical remission from increased cancer.  She is now almost 6 years out from diagnosis.  She would like to continue follow-up at the Cancer center.  She will return for an office visit in 8 months.  She would like to check the CA 19-9 when she returns in 8 months.  15 minutes were spent with the patient today.  The majority of the time was used for counseling and coordination of care.  Betsy Coder, MD  08/07/2018  8:38 AM

## 2018-08-07 NOTE — Patient Instructions (Signed)
1. Please provide copy of your Advanced Directive at next visit to scan into chart 2. Follow up with your primary care MD regarding blood pressure

## 2018-08-07 NOTE — Telephone Encounter (Signed)
Printed avs and calender of upcoming appointment. Per 12/6 los 

## 2018-09-14 ENCOUNTER — Other Ambulatory Visit: Payer: Self-pay | Admitting: Family Medicine

## 2018-09-14 DIAGNOSIS — Z1231 Encounter for screening mammogram for malignant neoplasm of breast: Secondary | ICD-10-CM

## 2018-10-29 ENCOUNTER — Ambulatory Visit: Payer: Medicare Other

## 2018-12-09 ENCOUNTER — Ambulatory Visit: Payer: Medicare Other

## 2019-03-10 ENCOUNTER — Other Ambulatory Visit: Payer: Self-pay | Admitting: Family Medicine

## 2019-03-10 DIAGNOSIS — M810 Age-related osteoporosis without current pathological fracture: Secondary | ICD-10-CM

## 2019-04-08 ENCOUNTER — Inpatient Hospital Stay: Payer: Medicare Other

## 2019-04-08 ENCOUNTER — Inpatient Hospital Stay: Payer: Medicare Other | Admitting: Nurse Practitioner

## 2019-04-09 ENCOUNTER — Ambulatory Visit
Admission: RE | Admit: 2019-04-09 | Discharge: 2019-04-09 | Disposition: A | Discharge status: Home / Self Care | Payer: Medicare Other | Source: Ambulatory Visit | Attending: Family Medicine | Admitting: Family Medicine

## 2019-04-09 ENCOUNTER — Telehealth: Payer: Self-pay | Admitting: Nurse Practitioner

## 2019-04-09 ENCOUNTER — Other Ambulatory Visit: Payer: Self-pay

## 2019-04-09 DIAGNOSIS — Z1231 Encounter for screening mammogram for malignant neoplasm of breast: Secondary | ICD-10-CM

## 2019-04-09 NOTE — Telephone Encounter (Signed)
Rescheduled missed appointments on 08/06 to 08/14, patient received calender.

## 2019-04-16 ENCOUNTER — Encounter: Payer: Self-pay | Admitting: Nurse Practitioner

## 2019-04-16 ENCOUNTER — Inpatient Hospital Stay: Payer: Medicare Other | Attending: Nurse Practitioner

## 2019-04-16 ENCOUNTER — Other Ambulatory Visit: Payer: Self-pay

## 2019-04-16 ENCOUNTER — Telehealth: Payer: Self-pay | Admitting: Oncology

## 2019-04-16 ENCOUNTER — Inpatient Hospital Stay (HOSPITAL_BASED_OUTPATIENT_CLINIC_OR_DEPARTMENT_OTHER): Payer: Medicare Other | Admitting: Nurse Practitioner

## 2019-04-16 VITALS — BP 163/77 | HR 57 | Temp 98.0°F | Resp 18 | Ht 61.0 in | Wt 142.2 lb

## 2019-04-16 DIAGNOSIS — M25562 Pain in left knee: Secondary | ICD-10-CM | POA: Insufficient documentation

## 2019-04-16 DIAGNOSIS — Z86718 Personal history of other venous thrombosis and embolism: Secondary | ICD-10-CM | POA: Insufficient documentation

## 2019-04-16 DIAGNOSIS — I10 Essential (primary) hypertension: Secondary | ICD-10-CM | POA: Diagnosis not present

## 2019-04-16 DIAGNOSIS — C257 Malignant neoplasm of other parts of pancreas: Secondary | ICD-10-CM | POA: Insufficient documentation

## 2019-04-16 DIAGNOSIS — K7689 Other specified diseases of liver: Secondary | ICD-10-CM | POA: Diagnosis not present

## 2019-04-16 DIAGNOSIS — G893 Neoplasm related pain (acute) (chronic): Secondary | ICD-10-CM | POA: Insufficient documentation

## 2019-04-16 DIAGNOSIS — E876 Hypokalemia: Secondary | ICD-10-CM | POA: Insufficient documentation

## 2019-04-16 DIAGNOSIS — C250 Malignant neoplasm of head of pancreas: Secondary | ICD-10-CM

## 2019-04-16 DIAGNOSIS — E785 Hyperlipidemia, unspecified: Secondary | ICD-10-CM | POA: Insufficient documentation

## 2019-04-16 DIAGNOSIS — Z7901 Long term (current) use of anticoagulants: Secondary | ICD-10-CM | POA: Diagnosis not present

## 2019-04-16 DIAGNOSIS — K5900 Constipation, unspecified: Secondary | ICD-10-CM | POA: Insufficient documentation

## 2019-04-16 DIAGNOSIS — Z923 Personal history of irradiation: Secondary | ICD-10-CM | POA: Diagnosis not present

## 2019-04-16 DIAGNOSIS — K439 Ventral hernia without obstruction or gangrene: Secondary | ICD-10-CM | POA: Insufficient documentation

## 2019-04-16 DIAGNOSIS — Z800 Family history of malignant neoplasm of digestive organs: Secondary | ICD-10-CM | POA: Insufficient documentation

## 2019-04-16 NOTE — Progress Notes (Signed)
  [place] OFFICE PROGRESS NOTE   Diagnosis: Pancreas cancer  INTERVAL HISTORY:   Sheila Hurst returns for follow-up.  She feels well overall.  She has left knee pain which she attributes to arthritis.  No abdominal pain.  No nausea or vomiting.  Bowels are moving, some constipation.  She has a good appetite.  Objective:  Vital signs in last 24 hours:  Blood pressure (!) 163/77, pulse (!) 57, temperature 98 F (36.7 C), temperature source Temporal, resp. rate 18, height 5\' 1"  (1.549 m), weight 142 lb 3.2 oz (64.5 kg), SpO2 100 %.    HEENT: Neck without mass. Lymphatics: No palpable cervical, supraclavicular, axillary or inguinal lymph nodes. Resp: Distant breath sounds.  No respiratory distress. Cardio: Regular rate and rhythm. GI: Abdomen soft and nontender.  No hepatomegaly.  No mass. Vascular: No leg edema.   Lab Results:  Lab Results  Component Value Date   WBC 3.5 (L) 10/10/2013   HGB 14.6 07/14/2014   HCT 43.0 07/14/2014   MCV 87.7 10/10/2013   PLT 182 10/10/2013   NEUTROABS 2.2 10/10/2013    Imaging:  No results found.  Medications: I have reviewed the patient's current medications.  Assessment/Plan: 1. Adenocarcinoma of the pancreas; mass in the neck of the pancreas, status post EUS guided biopsy 09/15/2012 with the cytology confirming malignant cells consistent with adenocarcinoma, T3 N0 by ultrasound. 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 Elevated CA 19-9. She began radiation and concurrent Xeloda chemotherapy on 10/19/2012, radiation completed 11/30/2012 -Whipple procedure 01/28/2013 with no residual carcinoma seen and stromal fibrosis consistent with post treatment effect.  -She began adjuvant gemcitabine 04/01/2013, completed 07/22/2013.  -CT abdomen/pelvis 01/01/2016 showed no evidence of local recurrence or metastatic disease. There was interval increase in the  size of a ventral abdominal wall hernia. 2. History of Pain secondary to pancreas cancer 3. Right liver cyst. Smaller on MRI 09/28/2012 compared to 11/03/2008. 4. Hypertension  5. Hyperlipidemia. 6. Family history of pancreas cancer (paternal aunt). 7. Anorexia/weight loss. Resolved. 8. Bradycardia when here on 11/11/2012. Atenolol was placed on hold. 9. Right leg DVT documented on a Doppler 11/26/2012-no longer on anticoagulation. 10. History of Hypokalemia. She is still taking a potassium supplement. 11. CT abdomen/pelvis 10/10/2013 done to evaluate abdominal pain. No acute abnormality identified in the abdomen and pelvis. Postsurgical changes at the pancreas. Stable liver cyst. 12. Port-A-Cath removal 07/14/2014  Disposition: Sheila Hurst remains in clinical remission from pancreas cancer.  We will follow-up on the CA-19-9 from today.  She will be 7 years out from diagnosis in January 2021.  She would like to continue follow[facility]up at the [facility].  She will return for a CA-19-9 and follow-up visit in 8 months.  She will contact the office in the interim with any problems.    Ned Card ANP/GNP-BC   04/16/2019  12:40 PM

## 2019-04-16 NOTE — Telephone Encounter (Signed)
Gave patient avs report and appointments for April 2021.  °

## 2019-04-17 LAB — CANCER ANTIGEN 19-9: CA 19-9: 27 U/mL (ref 0–35)

## 2019-04-19 ENCOUNTER — Other Ambulatory Visit: Payer: Medicare Other

## 2019-04-27 ENCOUNTER — Telehealth: Payer: Self-pay | Admitting: *Deleted

## 2019-04-27 NOTE — Telephone Encounter (Signed)
Patient called to request her CA 19-9 results. Informed her of normal result and compared to 1 year ago. F/U as scheduled.

## 2019-06-30 ENCOUNTER — Other Ambulatory Visit: Payer: Self-pay

## 2019-06-30 ENCOUNTER — Ambulatory Visit
Admission: RE | Admit: 2019-06-30 | Discharge: 2019-06-30 | Disposition: A | Discharge status: Home / Self Care | Payer: Medicare Other | Source: Ambulatory Visit | Attending: Family Medicine | Admitting: Family Medicine

## 2019-06-30 DIAGNOSIS — M810 Age-related osteoporosis without current pathological fracture: Secondary | ICD-10-CM

## 2019-10-25 ENCOUNTER — Ambulatory Visit: Payer: Medicare Other | Attending: Family

## 2019-10-25 DIAGNOSIS — Z23 Encounter for immunization: Secondary | ICD-10-CM | POA: Insufficient documentation

## 2019-10-25 NOTE — Progress Notes (Signed)
   Covid-19 Vaccination Clinic  Name:  Gissele Makris    MRN: [id number] DOB: 1941/04/13  10/25/2019  Ms. Tennenbaum was observed post Covid-19 immunization for 15 minutes without incidence. She was provided with Vaccine Information Sheet and instruction to access the V-Safe system.   Ms. Baudin was instructed to call 911 with any severe reactions post vaccine: Marland Kitchen Difficulty breathing  . Swelling of your face and throat  . A fast heartbeat  . A bad rash all over your body  . Dizziness and weakness    Immunizations Administered    Name Date Dose VIS Date Route   Moderna COVID-19 Vaccine 10/25/2019  9:37 AM 0.5 mL 08/03/2019 Intramuscular   Manufacturer: Moderna   Lot: [id number]   [id number]

## 2019-11-23 ENCOUNTER — Ambulatory Visit: Payer: Medicare Other | Attending: Family

## 2019-11-23 DIAGNOSIS — Z23 Encounter for immunization: Secondary | ICD-10-CM

## 2019-11-23 NOTE — Progress Notes (Signed)
   Covid-19 Vaccination Clinic  [name]    MRN: [id number] DOB: 1940-09-05  11/23/2019  [name] was observed post Covid-19 immunization for 15 minutes without incident. She was provided with Vaccine Information Sheet and instruction to access the V-Safe system.   [name] was instructed to call 911 with any severe reactions post vaccine: Marland Kitchen Difficulty breathing  . Swelling of face and throat  . A fast heartbeat  . A bad rash all over body  . Dizziness and weakness   Immunizations Administered    Name Date Dose VIS Date Route   Moderna COVID-19 Vaccine 11/23/2019 10:51 AM 0.5 mL 08/03/2019 Intramuscular   Manufacturer: Moderna   LotMV:[id number]   [place][id number]

## 2019-12-17 ENCOUNTER — Inpatient Hospital Stay: Payer: Medicare Other | Attending: Oncology | Admitting: Oncology

## 2019-12-17 ENCOUNTER — Other Ambulatory Visit: Payer: Self-pay

## 2019-12-17 ENCOUNTER — Inpatient Hospital Stay: Payer: Medicare Other

## 2019-12-17 ENCOUNTER — Other Ambulatory Visit: Payer: Self-pay | Admitting: *Deleted

## 2019-12-17 VITALS — BP 135/74 | HR 51 | Temp 98.5°F | Resp 20 | Ht 61.0 in | Wt 141.7 lb

## 2019-12-17 DIAGNOSIS — Z79899 Other long term (current) drug therapy: Secondary | ICD-10-CM | POA: Insufficient documentation

## 2019-12-17 DIAGNOSIS — C250 Malignant neoplasm of head of pancreas: Secondary | ICD-10-CM

## 2019-12-17 DIAGNOSIS — Z923 Personal history of irradiation: Secondary | ICD-10-CM | POA: Insufficient documentation

## 2019-12-17 DIAGNOSIS — C259 Malignant neoplasm of pancreas, unspecified: Secondary | ICD-10-CM | POA: Insufficient documentation

## 2019-12-17 DIAGNOSIS — Z800 Family history of malignant neoplasm of digestive organs: Secondary | ICD-10-CM | POA: Diagnosis not present

## 2019-12-17 DIAGNOSIS — K439 Ventral hernia without obstruction or gangrene: Secondary | ICD-10-CM | POA: Insufficient documentation

## 2019-12-17 DIAGNOSIS — I10 Essential (primary) hypertension: Secondary | ICD-10-CM | POA: Diagnosis not present

## 2019-12-17 DIAGNOSIS — K7689 Other specified diseases of liver: Secondary | ICD-10-CM | POA: Insufficient documentation

## 2019-12-17 DIAGNOSIS — R221 Localized swelling, mass and lump, neck: Secondary | ICD-10-CM | POA: Diagnosis not present

## 2019-12-17 DIAGNOSIS — Z86718 Personal history of other venous thrombosis and embolism: Secondary | ICD-10-CM | POA: Insufficient documentation

## 2019-12-17 DIAGNOSIS — E785 Hyperlipidemia, unspecified: Secondary | ICD-10-CM | POA: Diagnosis not present

## 2019-12-17 NOTE — Progress Notes (Signed)
  [facility] OFFICE PROGRESS NOTE   Diagnosis: Pancreas cancer  INTERVAL HISTORY:   Ms. Sheila Hurst returns as scheduled.  She feels well.  Good appetite.  She has occasional discomfort at the right subcostal region when she bends forward.  No consistent pain.  She has received the COVID-19 vaccine.  Objective:  Vital signs in last 24 hours:  Blood pressure 135/74, pulse (!) 51, temperature 98.5 F (36.9 C), temperature source Temporal, resp. rate 20, height 5\' 1"  (1.549 m), weight 141 lb 11.2 oz (64.3 kg), SpO2 100 %.     Lymphatics: No cervical, supraclavicular, axillary, or inguinal nodes Resp: Distant breath sounds, scattered end inspiratory rales, no respiratory distress Cardio: Regular rate and rhythm GI: No hepatosplenomegaly, no mass, nontender Vascular: No leg edema   Lab Results:  Lab Results  Component Value Date   WBC 3.5 (L) 10/10/2013   HGB 14.6 07/14/2014   HCT 43.0 07/14/2014   MCV 87.7 10/10/2013   PLT 182 10/10/2013   NEUTROABS 2.2 10/10/2013    CMP  Lab Results  Component Value Date   NA 142 12/31/2016   K 4.1 12/31/2016   CL 104 07/14/2014   CO2 29 12/31/2016   GLUCOSE 135 12/31/2016   BUN 11.1 12/31/2016   CREATININE 0.9 12/31/2016   CALCIUM 9.9 12/31/2016   PROT 7.9 12/31/2016   ALBUMIN 4.0 12/31/2016   AST 28 12/31/2016   ALT 22 12/31/2016   ALKPHOS 129 12/31/2016   BILITOT 0.49 12/31/2016   GFRNONAA 65 (L) 10/10/2013   GFRAA 75 (L) 10/10/2013     Medications: I have reviewed the patient's current medications.   Assessment/Plan: 1. Adenocarcinoma of the pancreas; mass in the neck of the pancreas, status post EUS guided biopsy 09/15/2012 with the cytology confirming malignant cells consistent with adenocarcinoma, T3 N0 by ultrasound. 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 Elevated CA 19-9. She began radiation and concurrent Xeloda chemotherapy on  10/19/2012, radiation completed 11/30/2012 -Whipple procedure 01/28/2013 with no residual carcinoma seen and stromal fibrosis consistent with post treatment effect.  -She began adjuvant gemcitabine 04/01/2013, completed 07/22/2013.  -CT abdomen/pelvis 01/01/2016 showed no evidence of local recurrence or metastatic disease. There was interval increase in the size of a ventral abdominal wall hernia. 2. History of Pain secondary to pancreas cancer 3. Right liver cyst. Smaller on MRI 09/28/2012 compared to 11/03/2008. 4. Hypertension  5. Hyperlipidemia. 6. Family history of pancreas cancer (paternal aunt). 7. Anorexia/weight loss. Resolved. 8. Bradycardia when here on 11/11/2012. Atenolol was placed on hold. 9. Right leg DVT documented on a Doppler 11/26/2012-no longer on anticoagulation. 10. History of Hypokalemia. She is still taking a potassium supplement. 11. CT abdomen/pelvis 10/10/2013 done to evaluate abdominal pain. No acute abnormality identified in the abdomen and pelvis. Postsurgical changes at the pancreas. Stable liver cyst. 12. Port-A-Cath removal 07/14/2014    Disposition: Sheila Hurst is in clinical remission from pancreas cancer.  She would like to continue follow-up at the cancer center.  She will return for an office visit in 8 months.  Betsy Coder, MD  12/17/2019  12:32 PM

## 2019-12-18 LAB — CANCER ANTIGEN 19-9: CA 19-9: 32 U/mL (ref 0–35)

## 2019-12-20 ENCOUNTER — Telehealth: Payer: Self-pay

## 2019-12-20 NOTE — Telephone Encounter (Signed)
TC to pt per Ned Card NP to let her know that her ca19-9 is normal. Pt verbalized understanding.

## 2019-12-22 ENCOUNTER — Telehealth: Payer: Self-pay | Admitting: *Deleted

## 2019-12-22 NOTE — Telephone Encounter (Signed)
Per Dr. Benay Spice: suggests she have genetic counseling/testing. New data recommends all pancreas cancer patients be tested for [facility] mutations. This could impact screening/surveillence for family and also a response to other targeted therapies for her if needed in the future. Left VM for patient to call office.

## 2020-01-12 ENCOUNTER — Telehealth: Payer: Self-pay | Admitting: *Deleted

## 2020-01-12 NOTE — Telephone Encounter (Signed)
Per Dr. Benay Spice: suggests she have genetic counseling/testing. New data recommends all pancreas cancer patients be tested for BRCA mutations. This could impact screening/surveillence for family and also a response to other targeted therapies for her if needed in the future. Left VM for patient to call office w/decision.

## 2020-04-21 ENCOUNTER — Other Ambulatory Visit: Payer: Medicare Other

## 2020-04-21 ENCOUNTER — Other Ambulatory Visit: Payer: Self-pay

## 2020-04-21 DIAGNOSIS — Z20822 Contact with and (suspected) exposure to covid-19: Secondary | ICD-10-CM

## 2020-04-22 LAB — SARS-COV-2, NAA 2 DAY TAT

## 2020-04-22 LAB — NOVEL CORONAVIRUS, NAA: SARS-CoV-2, NAA: NOT DETECTED

## 2020-04-22 LAB — SPECIMEN STATUS REPORT

## 2020-05-15 ENCOUNTER — Other Ambulatory Visit: Payer: Self-pay | Admitting: Family Medicine

## 2020-05-15 DIAGNOSIS — Z1231 Encounter for screening mammogram for malignant neoplasm of breast: Secondary | ICD-10-CM

## 2020-06-06 ENCOUNTER — Ambulatory Visit
Admission: RE | Admit: 2020-06-06 | Discharge: 2020-06-06 | Disposition: A | Discharge status: Home / Self Care | Payer: Medicare Other | Source: Ambulatory Visit | Attending: Family Medicine | Admitting: Family Medicine

## 2020-06-06 ENCOUNTER — Other Ambulatory Visit: Payer: Self-pay

## 2020-06-06 DIAGNOSIS — Z1231 Encounter for screening mammogram for malignant neoplasm of breast: Secondary | ICD-10-CM

## 2020-07-18 ENCOUNTER — Other Ambulatory Visit: Payer: Self-pay | Admitting: Family Medicine

## 2020-07-18 DIAGNOSIS — M545 Low back pain, unspecified: Secondary | ICD-10-CM

## 2020-07-23 ENCOUNTER — Other Ambulatory Visit: Payer: Self-pay

## 2020-07-23 ENCOUNTER — Ambulatory Visit (INDEPENDENT_AMBULATORY_CARE_PROVIDER_SITE_OTHER): Payer: Medicare Other

## 2020-07-23 DIAGNOSIS — M48061 Spinal stenosis, lumbar region without neurogenic claudication: Secondary | ICD-10-CM

## 2020-07-23 DIAGNOSIS — M5126 Other intervertebral disc displacement, lumbar region: Secondary | ICD-10-CM | POA: Diagnosis not present

## 2020-07-23 DIAGNOSIS — M545 Low back pain, unspecified: Secondary | ICD-10-CM

## 2020-07-23 DIAGNOSIS — M47816 Spondylosis without myelopathy or radiculopathy, lumbar region: Secondary | ICD-10-CM | POA: Diagnosis not present

## 2020-08-17 ENCOUNTER — Inpatient Hospital Stay: Payer: Medicare Other | Attending: Nurse Practitioner | Admitting: Nurse Practitioner

## 2020-09-14 ENCOUNTER — Ambulatory Visit: Payer: Medicare Other | Attending: Internal Medicine

## 2020-09-14 DIAGNOSIS — Z23 Encounter for immunization: Secondary | ICD-10-CM

## 2020-09-14 NOTE — Progress Notes (Signed)
   Covid-19 Vaccination Clinic  [name]    MRN: [id number] DOB: 10/18/1940  09/14/2020  [name] was observed post Covid-19 immunization for 15 minutes without incident. She was provided with Vaccine Information Sheet and instruction to access the V-Safe system.   [name] was instructed to call 911 with any severe reactions post vaccine: Marland Kitchen Difficulty breathing  . Swelling of face and throat  . A fast heartbeat  . A bad rash all over body  . Dizziness and weakness   Immunizations Administered    Name Date Dose VIS Date Route   Moderna Covid-19 Booster Vaccine 09/14/2020  1:48 PM 0.25 mL 06/21/2020 Intramuscular   Manufacturer: Moderna   Lot: [id number]   [place]: [id number]

## 2021-05-02 ENCOUNTER — Other Ambulatory Visit: Payer: Self-pay

## 2021-05-02 ENCOUNTER — Inpatient Hospital Stay: Payer: Medicare Other | Attending: Oncology | Admitting: Oncology

## 2021-05-02 VITALS — BP 165/76 | HR 61 | Temp 98.1°F | Resp 20 | Ht 61.0 in | Wt 134.6 lb

## 2021-05-02 DIAGNOSIS — E785 Hyperlipidemia, unspecified: Secondary | ICD-10-CM | POA: Diagnosis not present

## 2021-05-02 DIAGNOSIS — C259 Malignant neoplasm of pancreas, unspecified: Secondary | ICD-10-CM | POA: Diagnosis not present

## 2021-05-02 DIAGNOSIS — R630 Anorexia: Secondary | ICD-10-CM | POA: Diagnosis not present

## 2021-05-02 DIAGNOSIS — K439 Ventral hernia without obstruction or gangrene: Secondary | ICD-10-CM | POA: Insufficient documentation

## 2021-05-02 DIAGNOSIS — Z923 Personal history of irradiation: Secondary | ICD-10-CM | POA: Diagnosis not present

## 2021-05-02 DIAGNOSIS — K7689 Other specified diseases of liver: Secondary | ICD-10-CM | POA: Insufficient documentation

## 2021-05-02 DIAGNOSIS — R109 Unspecified abdominal pain: Secondary | ICD-10-CM | POA: Diagnosis not present

## 2021-05-02 DIAGNOSIS — C250 Malignant neoplasm of head of pancreas: Secondary | ICD-10-CM | POA: Diagnosis not present

## 2021-05-02 DIAGNOSIS — I10 Essential (primary) hypertension: Secondary | ICD-10-CM | POA: Insufficient documentation

## 2021-05-02 DIAGNOSIS — Z79899 Other long term (current) drug therapy: Secondary | ICD-10-CM | POA: Diagnosis not present

## 2021-05-02 DIAGNOSIS — Z800 Family history of malignant neoplasm of digestive organs: Secondary | ICD-10-CM | POA: Insufficient documentation

## 2021-05-02 NOTE — Progress Notes (Signed)
  [place] OFFICE PROGRESS NOTE   Diagnosis: Pancreas cancer  INTERVAL HISTORY:   Sheila Hurst was last seen at the Cancer center in April 2021.  She reports generally feeling well.  Good appetite.  She has occasional right abdominal pain when bending over.  No consistent pain.  Objective:  Vital signs in last 24 hours:  Blood pressure (!) 165/76, pulse 61, temperature 98.1 F (36.7 C), temperature source Oral, resp. rate 20, height '5\' 1"'$  (1.549 m), weight 134 lb 9.6 oz (61.1 kg), SpO2 100 %.    Lymphatics: No cervical, supraclavicular, right axillary, or inguinal nodes.  Soft 1 cm high left axillary node Breast: Left breast without mass Resp: Lungs clear bilaterally Cardio: Regular rate and rhythm GI: No hepatosplenomegaly, nontender, fullness in the left mid abdomen associated with the aortic pulse Vascular: No leg edema   Lab Results:  Lab Results  Component Value Date   WBC 3.5 (L) 10/10/2013   HGB 14.6 07/14/2014   HCT 43.0 07/14/2014   MCV 87.7 10/10/2013   PLT 182 10/10/2013   NEUTROABS 2.2 10/10/2013    CMP  Lab Results  Component Value Date   NA 142 12/31/2016   K 4.1 12/31/2016   CL 104 07/14/2014   CO2 29 12/31/2016   GLUCOSE 135 12/31/2016   BUN 11.1 12/31/2016   CREATININE 0.9 12/31/2016   CALCIUM 9.9 12/31/2016   PROT 7.9 12/31/2016   ALBUMIN 4.0 12/31/2016   AST 28 12/31/2016   ALT 22 12/31/2016   ALKPHOS 129 12/31/2016   BILITOT 0.49 12/31/2016   GFRNONAA 65 (L) 10/10/2013   GFRAA 75 (L) 10/10/2013    Lab Results  Component Value Date   [id number] 12/17/2019    Medications: I have reviewed the patient's current medications.   Assessment/Plan: Adenocarcinoma of the pancreas; mass in the neck of the pancreas, status post EUS guided biopsy 09/15/2012 with the cytology confirming malignant cells consistent with adenocarcinoma, T3 N0 by ultrasound. Staging CT and MRI concerning for abutment of the confluence of the  superior mesenteric vein and portal vein. Elevated CA 19-9. She began radiation and concurrent Xeloda chemotherapy on 10/19/2012, radiation completed 11/30/2012 -Whipple procedure 01/28/2013 with no residual carcinoma seen and stromal fibrosis consistent with post treatment effect.   -She began adjuvant gemcitabine 04/01/2013, completed 07/22/2013.  - CT abdomen/pelvis 01/01/2016 showed no evidence of local recurrence or metastatic disease. There was interval increase in the size of a ventral abdominal wall hernia. History of Pain secondary to pancreas cancer Right liver cyst. Smaller on MRI 09/28/2012 compared to 11/03/2008. Hypertension  Hyperlipidemia. Family history of pancreas cancer (paternal aunt). Anorexia/weight loss. Resolved. Bradycardia when here on 11/11/2012. Atenolol was placed on hold. Right leg DVT documented on a Doppler 11/26/2012-no longer on anticoagulation. History of Hypokalemia. She is still taking a potassium supplement. CT abdomen/pelvis 10/10/2013 done to evaluate abdominal pain. No acute abnormality identified in the abdomen and pelvis. Postsurgical changes at the pancreas. Stable liver cyst. Port-A-Cath removal 07/14/2014     Disposition: [name] remains in clinical remission from pancreas cancer.  She would like to continue follow-up with Cancer center.  She will return for an office visit in 1 year.  Betsy Coder, MD  05/02/2021  12:53 PM

## 2021-05-16 ENCOUNTER — Other Ambulatory Visit: Payer: Self-pay | Admitting: Family Medicine

## 2021-05-16 DIAGNOSIS — M810 Age-related osteoporosis without current pathological fracture: Secondary | ICD-10-CM

## 2021-05-16 DIAGNOSIS — M8000XA Age-related osteoporosis with current pathological fracture, unspecified site, initial encounter for fracture: Secondary | ICD-10-CM

## 2021-05-17 ENCOUNTER — Other Ambulatory Visit: Payer: Self-pay | Admitting: Family Medicine

## 2021-05-17 DIAGNOSIS — Z1231 Encounter for screening mammogram for malignant neoplasm of breast: Secondary | ICD-10-CM

## 2021-07-31 ENCOUNTER — Ambulatory Visit
Admission: RE | Admit: 2021-07-31 | Discharge: 2021-07-31 | Disposition: A | Discharge status: Home / Self Care | Payer: Medicare Other | Source: Ambulatory Visit | Attending: Family Medicine | Admitting: Family Medicine

## 2021-07-31 DIAGNOSIS — Z1231 Encounter for screening mammogram for malignant neoplasm of breast: Secondary | ICD-10-CM

## 2021-07-31 DIAGNOSIS — M810 Age-related osteoporosis without current pathological fracture: Secondary | ICD-10-CM

## 2021-09-27 NOTE — Progress Notes (Signed)
Office Visit Note  Patient: Sheila Hurst             Date of Birth: 1940-09-19           MRN: [id number]             PCP: Gaynelle Arabian, MD Referring: Gaynelle Arabian, MD Visit Date: 10/11/2021 Occupation: @GUAROCC @  Subjective:  Treatment of osteoporosis  History of Present Illness: Sheila Hurst is a 81 y.o. female seen in consultation per request of her PCP.  According to the patient and her daughter she was diagnosed with osteoporosis about 2 years ago.  She was placed on Fosamax.  She states she has been taking Fosamax only intermittently.  She forgets to take Fosamax.  She had recent bone density in November 2022 which showed deterioration of the bone mass density.  Since then she has been taking Fosamax on a regular basis.  They came today to discuss different treatment options for osteoporosis.  She denies any joint discomfort except in her hands off and on.  Activities of Daily Living:  Patient reports morning stiffness for 1 hour.   Patient Reports nocturnal pain.  Difficulty dressing/grooming: Denies Difficulty climbing stairs: Reports Difficulty getting out of chair: Reports Difficulty using hands for taps, buttons, cutlery, and/or writing: Reports  Review of Systems  Constitutional:  Positive for fatigue.  HENT:  Positive for mouth dryness. Negative for mouth sores and nose dryness.   Eyes:  Negative for pain, itching and dryness.  Respiratory:  Negative for shortness of breath and difficulty breathing.   Cardiovascular:  Negative for chest pain and palpitations.  Gastrointestinal:  Negative for blood in stool, constipation and diarrhea.  Endocrine: Positive for cold intolerance and increased urination.  Genitourinary:  Negative for painful urination.  Musculoskeletal:  Positive for joint pain, joint pain and morning stiffness. Negative for joint swelling, myalgias, muscle tenderness and myalgias.  Skin:  Negative for color change, rash and redness.   Allergic/Immunologic: Negative for susceptible to infections.  Neurological:  Positive for dizziness and memory loss. Negative for numbness, headaches and weakness.  Hematological:  Positive for bruising/bleeding tendency.  Psychiatric/Behavioral:  Positive for confusion.    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   Malignant neoplasm of head of pancreas ([place]) 10/01/2012   Hyperlipidemia    Arthritis    GERD (gastroesophageal reflux disease)    Hypertension    Pancreatic adenocarcinoma ([place]) 09/29/2012    Past Medical History:  Diagnosis Date   Arthritis    left knee   Bronchitis    GERD (gastroesophageal reflux disease)    no medication   Hyperlipidemia    Hypertension    Pancreatic mass 09/15/2012   Head of Pancreas, malignant cells consistent w/adenocarcinoma   Radiation 10/19/12-11/30/12   50.4 gray Pancreatic Ca/abdomen   Wears dentures    top   Wears glasses    Weight loss    Prior to Dx    Family History  Problem Relation Age of Onset   Alzheimer's disease Mother    Throat cancer Father    Rheum arthritis Sister    Heart murmur Sister    Osteoarthritis Sister    Prostate cancer Brother    Prostate cancer Brother    Stomach cancer Brother    Thyroid disease Brother  Heart murmur Brother    Alzheimer's disease Maternal Aunt    Pancreatic cancer Paternal Aunt    Alzheimer's disease Paternal Uncle    Past Surgical History:  Procedure Laterality Date   APPENDECTOMY  70's   Badder tack  09-2011   [facility] REPAIR     CARDIAC CATHETERIZATION  12/2006   CHOLECYSTECTOMY  12/2012   EUS  09/15/2012   Procedure: ESOPHAGEAL ENDOSCOPIC ULTRASOUND (EUS) RADIAL;  Surgeon: Arta Silence, MD;  Location: [facility];  Service: Endoscopy;  Laterality: N/A;  + or -  fna unsure of what scope   FINE NEEDLE ASPIRATION  09/15/2012   Procedure: FINE NEEDLE ASPIRATION (FNA) LINEAR;  Surgeon: Arta Silence, MD;  Location: [facility];  Service: Endoscopy;  Laterality: N/A;   LAPAROSCOPY N/A 01/28/2013   Procedure: LAPAROSCOPY DIAGNOSTIC,  WHIPPLE;  Surgeon: Stark Klein, MD;  Location: [facility];  Service: General;  Laterality: N/A;   PANCREATICODUODENECTOMY  12/2012   PORT-A-CATH REMOVAL Right 07/14/2014   Procedure: REMOVAL PORT-A-CATH;  Surgeon: Stark Klein, MD;  Location: [facility];  Service: General;  Laterality: Right;   PORTACATH PLACEMENT N/A 03/18/2013   Procedure: INSERTION PORT-A-CATH;  Surgeon: Stark Klein, MD;  Location: [facility];  Service: General;  Laterality: N/A;  [place]ro time 8minute, 11 seconds.   TONSILLECTOMY     age 30   TOTAL ABDOMINAL HYSTERECTOMY W/ BILATERAL SALPINGOOPHORECTOMY  1993   TUBAL LIGATION  1977   WHIPPLE PROCEDURE N/A 01/28/2013   Procedure: WHIPPLE PROCEDURE;  Surgeon: Stark Klein, MD;  Location: [facility];  Service: General;  Laterality: N/A;   Social History   Social History Narrative   Widowed-lives alone   1 son , 1 daughter   Retired-customer service with [employer] History  Administered Date(s) Administered   Influenza-Unspecified 05/07/2018   Moderna SARS-COV2 Booster Vaccination 09/14/2020   Moderna Sars-Covid-2 Vaccination 10/25/2019, 11/23/2019     Objective: Vital Signs: BP (!) 175/86 (BP Location: Left Arm, Patient Position: Sitting, Cuff Size: Normal)    Pulse (!) 54    Ht 5\' 2"  (1.575 m)    Wt 129 lb 9.6 oz (58.8 kg)    BMI 23.70 kg/m    Physical Exam Vitals and nursing note reviewed.  Constitutional:      Appearance: She is well-developed.  HENT:     Head: Normocephalic and atraumatic.  Eyes:     Conjunctiva/sclera: Conjunctivae normal.  Cardiovascular:     Rate and Rhythm: Normal rate and regular rhythm.     Heart sounds: Normal heart  sounds.  Pulmonary:     Effort: Pulmonary effort is normal.     Breath sounds: Normal breath sounds.  Abdominal:     General: Bowel sounds are normal.     Palpations: Abdomen is soft.  Musculoskeletal:     Cervical back: Normal range of motion.  Lymphadenopathy:     Cervical: No cervical adenopathy.  Skin:    General: Skin is warm and dry.     Capillary Refill: Capillary refill takes less than 2 seconds.  Neurological:     Mental Status: She is alert and oriented to person, place, and time.  Psychiatric:        Behavior: Behavior normal.     Musculoskeletal Exam: C-spine was in good range of motion.  She has thoracic kyphosis.  There was no tenderness over thoracic or lumbar spine.  Shoulder joints, elbow joints, wrist joints, MCPs PIPs and DIPs with good range of  motion.  She had bilateral PIP and DIP thickening.  Hip joints and knee joints in good range of motion.  There was no tenderness over ankles or MTPs.  CDAI Exam: CDAI Score: -- Patient Global: --; Provider Global: -- Swollen: --; Tender: -- Joint Exam 10/11/2021   No joint exam has been documented for this visit   There is currently no information documented on the homunculus. Go to the Rheumatology activity and complete the homunculus joint exam.  Investigation: No additional findings.  Imaging: No results found.  Recent Labs: Lab Results  Component Value Date   WBC 3.5 (L) 10/10/2013   HGB 14.6 07/14/2014   PLT 182 10/10/2013   NA 142 12/31/2016   K 4.1 12/31/2016   CL 104 07/14/2014   CO2 29 12/31/2016   GLUCOSE 135 12/31/2016   BUN 11.1 12/31/2016   CREATININE 0.9 12/31/2016   BILITOT 0.49 12/31/2016   ALKPHOS 129 12/31/2016   AST 28 12/31/2016   ALT 22 12/31/2016   PROT 7.9 12/31/2016   ALBUMIN 4.0 12/31/2016   CALCIUM 9.9 12/31/2016   GFRAA 75 (L) 10/10/2013    Speciality Comments: No specialty comments available.  Procedures:  No procedures performed Allergies: Lisinopril, Metoprolol,  and Ondansetron   Assessment / Plan:     Visit Diagnoses: Age-related osteoporosis without current pathological fracture - DEXA 07/31/21: BMD forearm 0.514 with T-score - 4.2.  T score left femur total -3.2, BMD 0.602, June 30, 2019 T score -2.9 and BMD 0.637.  She has been taking Fosamax for the last 2 years intermittently.  We had detailed discussion regarding osteoporosis and risk of fracture..  Patient has taken chemotherapy and radiation therapy in the past for pancreatic cancer.  She would not be a good candidate for Tymlos or Forteo.  I discussed the option of IV Reclast and subcu Prolia.  Indications side effects contraindications of both Reclast and Prolia were discussed at length.  Patient is concerned about the osteonecrosis of the jaw.  She is also concerned about the side effects including atypical femoral fracture.  She is not experiencing any side effects from Fosamax and is inclined towards continuing Fosamax.  She would like to take Fosamax on a more regular basis.  She can take Fosamax for total of 5 years and then check BMD.  I advised her to discuss this further with Dr. Marisue Humble.  I also gave her information on Prolia subcutaneous injections for her review.  Patient understands in case she switches to Prolia it would be for lifetime and it does increase the risk of osteonecrosis of the jaw and atypical femoral fracture.  All the questions were answered which were asked by patient and her daughter.  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  Primary hypertension-blood pressure was elevated today.  She was advised to monitor blood pressure closely and follow-up with the PCP.  History of hyperlipidemia  History of gastroesophageal reflux (GERD)  Prediabetes  Orders: Orders Placed This Encounter  Procedures    COMPLETE METABOLIC PANEL WITH GFR   Parathyroid hormone, intact (no Ca)   VITAMIN D 25 Hydroxy (Vit-D Deficiency, Fractures)   TSH   Serum protein electrophoresis with reflex   No orders of the defined types were placed in this encounter.   Face-to-face time spent with patient was 50 minutes. Greater than 50% of time was spent in counseling and coordination of  care.  Follow-Up Instructions: Return if symptoms worsen or fail to improve, for Osteoporosis.   Bo Merino, MD  Note - This record has been created using Editor, commissioning.  Chart creation errors have been sought, but may not always  have been located. Such creation errors do not reflect on  the standard of medical care.

## 2021-10-03 DIAGNOSIS — R350 Frequency of micturition: Secondary | ICD-10-CM | POA: Diagnosis not present

## 2021-10-11 ENCOUNTER — Telehealth: Payer: Self-pay | Admitting: Pharmacist

## 2021-10-11 ENCOUNTER — Ambulatory Visit: Payer: Medicare Other | Admitting: Rheumatology

## 2021-10-11 ENCOUNTER — Telehealth: Payer: Self-pay | Admitting: Rheumatology

## 2021-10-11 ENCOUNTER — Other Ambulatory Visit: Payer: Self-pay

## 2021-10-11 ENCOUNTER — Other Ambulatory Visit (HOSPITAL_COMMUNITY): Payer: Self-pay

## 2021-10-11 ENCOUNTER — Encounter: Payer: Self-pay | Admitting: Rheumatology

## 2021-10-11 VITALS — BP 175/86 | HR 54 | Ht 62.0 in | Wt 129.6 lb

## 2021-10-11 DIAGNOSIS — M810 Age-related osteoporosis without current pathological fracture: Secondary | ICD-10-CM | POA: Diagnosis not present

## 2021-10-11 DIAGNOSIS — C259 Malignant neoplasm of pancreas, unspecified: Secondary | ICD-10-CM

## 2021-10-11 DIAGNOSIS — I10 Essential (primary) hypertension: Secondary | ICD-10-CM

## 2021-10-11 DIAGNOSIS — M26609 Unspecified temporomandibular joint disorder, unspecified side: Secondary | ICD-10-CM | POA: Diagnosis not present

## 2021-10-11 DIAGNOSIS — K8681 Exocrine pancreatic insufficiency: Secondary | ICD-10-CM | POA: Diagnosis not present

## 2021-10-11 DIAGNOSIS — Z8639 Personal history of other endocrine, nutritional and metabolic disease: Secondary | ICD-10-CM

## 2021-10-11 DIAGNOSIS — Z8719 Personal history of other diseases of the digestive system: Secondary | ICD-10-CM

## 2021-10-11 DIAGNOSIS — R7303 Prediabetes: Secondary | ICD-10-CM | POA: Diagnosis not present

## 2021-10-11 NOTE — Telephone Encounter (Signed)
I called patient and discussed that Dr. Marisue Humble is not accepting new Prolia patients.  Patient stated that she has been a long-term patient at the practice.  She would prefer to get Prolia injections at that office.  She will call us when she contacts the office.

## 2021-10-11 NOTE — Telephone Encounter (Signed)
Patient and daughter came back to the office after seeing her primary and stated that at the patients appointment today Dr. Estanislado Pandy told them that the patient should not take Fosamax longer then 5 years. Patient states Dr. Estanislado Pandy said after 5 years that she should switch to Prolia. Patients daughter states she has been on Fosamax since 10-16-2015 and she would like to discontinue per Dr. Estanislado Pandy and get the ball rolling for starting Prolia. Patient states she will be coming by the office soon to get labs done.

## 2021-10-11 NOTE — Patient Instructions (Signed)
Osteoporosis Osteoporosis is when the bones get thin and weak. This can cause your bones to break (fracture) more easily. What are the causes? The exact cause of this condition is not known. What increases the risk? Having family members with this condition. Not eating enough healthy foods. Taking certain medicines. Being female. Being age 80 or older. Smoking or using other products that contain nicotine or tobacco, such as e-cigarettes or chewing tobacco. Not exercising. Being of European or Asian ancestry. Having a small body frame. What are the signs or symptoms? A broken bone might be the first sign, especially if the break results from a fall or injury that usually would not cause a bone to break. Other signs and symptoms include: Pain in the neck or low back. Being hunched over (stooped posture). Getting shorter. How is this treated? Eating more foods with more calcium and vitamin D in them. Doing exercises. Stopping tobacco use. Limiting how much alcohol you drink. Taking medicines to slow bone loss or help make the bones stronger. Taking supplements of calcium and vitamin D every day. Taking medicines to replace chemicals in the body (hormone replacement medicines). Monitoring your levels of calcium and vitamin D. The goal of treatment is to strengthen your bones and lower your risk for a bone break. Follow these instructions at home: Eating and drinking Eat plenty of calcium and vitamin D. These nutrients are good for your bones. Good sources of calcium and vitamin D include: Some fish, such as salmon and tuna. Foods that have calcium and vitamin D added to them (fortified foods), such as some breakfast cereals. Egg yolks. Cheese. Liver.  Activity Do exercises as told by your doctor. Ask your doctor what exercises are safe for you. You should do: Exercises that make your muscles work to hold your body weight up (weight-bearing exercises). These include tai chi,  yoga, and walking. Exercises to make your muscles stronger. One example is lifting weights. Lifestyle Do not drink alcohol if: Your doctor tells you not to drink. You are pregnant, may be pregnant, or are planning to become pregnant. If you drink alcohol: Limit how much you use to: 0-1 drink a day for women. 0-2 drinks a day for men. Know how much alcohol is in your drink. In the [place] one drink equals one 12 oz bottle of beer (355 mL), one 5 oz glass of wine (148 mL), or one 1 oz glass of hard liquor (44 mL). Do not smoke or use any products that contain nicotine or tobacco. If you need help quitting, ask your doctor. Preventing falls Use tools to help you move around (mobility aids) as needed. These include canes, walkers, scooters, and crutches. Keep rooms well-lit. Put away things on the floor that could make you trip. These include cords and rugs. Install safety rails on stairs. Install grab bars in bathrooms. Use rubber mats in slippery areas, like bathrooms. Wear shoes that: Fit you well. Support your feet. Have closed toes. Have rubber soles or low heels. Tell your doctor about all of the medicines you are taking. Some medicines can make you more likely to fall. General instructions Take over-the-counter and prescription medicines only as told by your doctor. Keep all follow-up visits. Contact a doctor if: You have not been tested (screened) for osteoporosis and you are: A woman who is age 65 or older. A man who is age 70 or older. Get help right away if: You fall. You get hurt. Summary Osteoporosis happens when your bones   get thin and weak. Weak bones can break (fracture) more easily. Eat plenty of calcium and vitamin D. These are good for your bones. Tell your doctor about all of the medicines that you take. This information is not intended to replace advice given to you by your health care provider. Make sure you discuss any questions you have with your health care  provider. Document Revised: 02/03/2020 Document Reviewed: 02/03/2020 Elsevier Patient Education  2022 Elsevier Inc.  

## 2021-10-11 NOTE — Telephone Encounter (Signed)
Called [facility] for medical benefits verification for Prolia - [id number], [id number]  Dx: Age-related osteoporosis (M80.0)  For in-network, there is 20% coinsurance. [facility] is in-network.  Patient has no deductible for in-network. The maximum OOP is $3600 for in-network. As of 10/11/21, patient has accumulated $0 for this calendar year.  Ref # [id number]  Per test claim through pharmacy benefit, copay is $300.  Knox Saliva, PharmD, MPH, BCPS Clinical Pharmacist (Rheumatology and Pulmonology)

## 2021-10-11 NOTE — Progress Notes (Signed)
Pharmacy Note  Subjective:  Patient and her daughter, Tammi Klippel, presents today to [facility] for initial office visit.  Patient was seen by the pharmacist for counseling on Prolia.  Patient has taken alendronate intermittently for 2 years - managed by PCP. Took therapy intermittently due to ongoing cancer management and patient's general inability to remember to take medication.  Objective: CMP     Component Value Date/Time   NA 142 12/31/2016 1335   K 4.1 12/31/2016 1335   CL 104 07/14/2014 0659   CL 104 10/05/2012 1234   CO2 29 12/31/2016 1335   GLUCOSE 135 12/31/2016 1335   GLUCOSE 118 (H) 10/05/2012 1234   BUN 11.1 12/31/2016 1335   CREATININE 0.9 12/31/2016 1335   CALCIUM 9.9 12/31/2016 1335   PROT 7.9 12/31/2016 1335   ALBUMIN 4.0 12/31/2016 1335   AST 28 12/31/2016 1335   ALT 22 12/31/2016 1335   ALKPHOS 129 12/31/2016 1335   BILITOT 0.49 12/31/2016 1335   GFRNONAA 65 (L) 10/10/2013 1920   GFRAA 75 (L) 10/10/2013 1920   07/31/21 - The BMD measured at Forearm Radius 33% is 0.514 g/cm2 with a T-score of -4.2.   Assessment/Plan:  Counseled patient on purpose, proper use, and adverse effects of Prolia.  Counseled patient that Prolia is a medication that must be injected every 6 months by a healthcare professional.  Advised patient to take calcium 1200 mg daily and vitamin D 800 units daily.  Reviewed the most common adverse effects of Prolia including risk of infection, osteonecrosis of the jaw, rash, and muscle/bone pain.  Patient confirms she does not have any major dental work planned at this time.  Reviewed with patient the signs/symptoms of low calcium and advised patient to alert [place] if she experiences these symptoms.  Provided patient with medication education material and answered all questions. Future lab orders for Prolia placed today in case patient decides to move forward with this.  Counseled patient that alendronate is an oral bisphosphonate that reduces  bone turnover by inhibiting osteoclasts that chew up bone.  Counseled patient on purpose, proper use, and adverse effects of alendronate.  Reviewed with patient that alendronate should be taken once weekly.  Alendronate must be taking first thing in the morning, with a full glass of water, and she must wait an hour prior to eating food.  Also advised patient that she should not lie down until after she has eaten.  Reviewed importance of taking calcium and vitamin D with bisphosphonate therapy.  Provided patient with medication education material and answered all questions.  Reviewed adverse events of alendronate including risk of nausea & diarrhea, headache, and muscle & bone pain.  Reviewed rare adverse effect of osteonecrosis of the jaw and advised patient to alert her dentist that she is on alendronate prior to any major dental work.  Patient self-reports non-compliance due to forgetfulness  Patient will plan to schedule dental visit to ensure she has no major invasive dental work prior to starting. I did review that it is not required that dental check up be completed prior to starting therapy but they would like to do so. Daughter states she will reach out to PCP to confirm the length of therapy for Fosamax. Advised that if they would like to complete course of Fosamax, then PCP is the better provider to manage since they have detailed history of therapy length and have initiated therapy for her. If they would like to start Prolia, we will need to run  BIV and then   Reviewed that if they'd like to move forward with Prolia, we will need labs completed prior to starting and before each dose. Patient states Prolia may be better option since this will not require her to remember to take every week.  Knox Saliva, PharmD, MPH, BCPS Clinical Pharmacist (Rheumatology and Pulmonology)

## 2021-10-28 NOTE — Progress Notes (Signed)
Office Visit Note  Patient: Sheila Hurst             Date of Birth: Jan 13, 1941           MRN: [id number]             PCP: Gaynelle Arabian, MD Referring: Gaynelle Arabian, MD Visit Date: 11/01/2021 Occupation: @GUAROCC @  Subjective:  Treatment of osteoporosis  History of Present Illness: Sheila Hurst is a 81 y.o. female with history of osteoporosis.  She was accompanied by her daughter today.  She states she reviewed information on Prolia and wants to start on Prolia injections.  She has an appointment coming up with the dentist.  She has partial dentures.  She still have seven teeth for which she needs evaluation.   Activities of Daily Living:  Patient reports morning stiffness for 10 minutes.   Patient Denies nocturnal pain.  Difficulty dressing/grooming: Denies Difficulty climbing stairs: Reports Difficulty getting out of chair: Reports Difficulty using hands for taps, buttons, cutlery, and/or writing: Denies  Review of Systems  Constitutional:  Positive for fatigue.  HENT:  Negative for mouth sores, mouth dryness and nose dryness.   Eyes:  Negative for pain, itching and dryness.  Respiratory:  Negative for shortness of breath and difficulty breathing.   Cardiovascular:  Negative for chest pain and palpitations.  Gastrointestinal:  Negative for blood in stool, constipation and diarrhea.  Endocrine: Negative for increased urination.  Genitourinary:  Negative for difficulty urinating.  Musculoskeletal:  Positive for joint pain, joint pain and morning stiffness. Negative for joint swelling, myalgias, muscle tenderness and myalgias.  Skin:  Negative for color change, rash and redness.  Allergic/Immunologic: Negative for susceptible to infections.  Neurological:  Positive for dizziness and memory loss. Negative for numbness, headaches and weakness.  Hematological:  Negative for bruising/bleeding tendency.  Psychiatric/Behavioral:  Negative for confusion.     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[place]) 10/01/2012   Hyperlipidemia    Arthritis    GERD (gastroesophageal reflux disease)    Hypertension    Pancreatic adenocarcinoma ([place]) 09/29/2012    Past Medical History:  Diagnosis Date   Arthritis    left knee   Bronchitis    GERD (gastroesophageal reflux disease)    no medication   Hyperlipidemia    Hypertension    Pancreatic mass 09/15/2012   Head of Pancreas, malignant cells consistent w/adenocarcinoma   Radiation 10/19/12-11/30/12   50.4 gray Pancreatic Ca/abdomen   Wears dentures    top   Wears glasses    Weight loss    Prior to Dx    Family History  Problem Relation Age of Onset   Alzheimer's disease Mother    Throat cancer Father    Rheum arthritis Sister    Heart murmur Sister    Osteoarthritis Sister    Prostate cancer Brother    Prostate cancer Brother    Stomach cancer Brother    Thyroid disease Brother    Heart murmur Brother    Alzheimer's disease Maternal Aunt    Pancreatic cancer Paternal Aunt    Alzheimer's disease Paternal Uncle    Past Surgical History:  Procedure Laterality Date   APPENDECTOMY  70's   Badder tack  09-2011   [place]  CARDIAC CATHETERIZATION  12/2006   CHOLECYSTECTOMY  12/2012   EUS  09/15/2012   Procedure: ESOPHAGEAL ENDOSCOPIC ULTRASOUND (EUS) RADIAL;  Surgeon: Arta Silence, MD;  Location: [facility];  Service: Endoscopy;  Laterality: N/A;  + or - fna unsure of what scope   FINE NEEDLE ASPIRATION  09/15/2012   Procedure: FINE NEEDLE ASPIRATION (FNA) LINEAR;  Surgeon: Arta Silence, MD;  Location: [facility];  Service: Endoscopy;  Laterality: N/A;   LAPAROSCOPY N/A 01/28/2013   Procedure: LAPAROSCOPY DIAGNOSTIC,   WHIPPLE;  Surgeon: Stark Klein, MD;  Location: [facility];  Service: General;  Laterality: N/A;   PANCREATICODUODENECTOMY  12/2012   PORT-A-CATH REMOVAL Right 07/14/2014   Procedure: REMOVAL PORT-A-CATH;  Surgeon: Stark Klein, MD;  Location: [place];  Service: General;  Laterality: Right;   PORTACATH PLACEMENT N/A 03/18/2013   Procedure: INSERTION PORT-A-CATH;  Surgeon: Stark Klein, MD;  Location: [place];  Service: General;  Laterality: N/A;  Flouro time 26minute, 11 seconds.   TONSILLECTOMY     age 60   TOTAL ABDOMINAL HYSTERECTOMY W/ BILATERAL SALPINGOOPHORECTOMY  1993   TUBAL LIGATION  1977   WHIPPLE PROCEDURE N/A 01/28/2013   Procedure: WHIPPLE PROCEDURE;  Surgeon: Stark Klein, MD;  Location: [facility];  Service: General;  Laterality: N/A;   Social History   Social History Narrative   Widowed-lives alone   1 son , 1 daughter   Retired-customer service with [employer] History  Administered Date(s) Administered   Influenza-Unspecified 05/07/2018   Moderna SARS-COV2 Booster Vaccination 09/14/2020   Moderna Sars-Covid-2 Vaccination 10/25/2019, 11/23/2019     Objective: Vital Signs: BP (!) 159/77 (BP Location: Left Arm, Patient Position: Sitting, Cuff Size: Normal)    Pulse 64    Ht 5\' 2"  (1.575 m)    Wt 131 lb (59.4 kg)    BMI 23.96 kg/m    Physical Exam Vitals and nursing note reviewed.  Constitutional:      Appearance: She is well-developed.  HENT:     Head: Normocephalic and atraumatic.  Eyes:     Conjunctiva/sclera: Conjunctivae normal.  Cardiovascular:     Rate and Rhythm: Normal rate and regular rhythm.     Heart sounds: Normal heart sounds.  Pulmonary:     Effort: Pulmonary effort is normal.     Breath sounds: Normal breath sounds.  Abdominal:     General: Bowel sounds are normal.     Palpations: Abdomen is soft.  Musculoskeletal:     Cervical back: Normal range of motion.  Lymphadenopathy:     Cervical: No  cervical adenopathy.  Skin:    General: Skin is warm and dry.     Capillary Refill: Capillary refill takes less than 2 seconds.  Neurological:     Mental Status: She is alert and oriented to person, place, and time.  Psychiatric:        Behavior: Behavior normal.     Musculoskeletal Exam: C-spine was in good range of motion.  She had thoracic kyphosis.  She had no tenderness over thoracic or lumbar spine.  Shoulder joints, elbow joints, wrist joints, MCPs PIPs and DIPs with good range of motion with no synovitis.  Hip joints and knee joints in good range of motion.  CDAI Exam: CDAI Score: -- Patient Global: --; Provider Global: -- Swollen: --; Tender: -- Joint Exam 11/01/2021   No joint exam has been documented for this visit   There is currently no information documented on the homunculus.  Go to the Rheumatology activity and complete the homunculus joint exam.  Investigation: No additional findings.  Imaging: No results found.  Recent Labs: Lab Results  Component Value Date   WBC 3.5 (L) 10/10/2013   HGB 14.6 07/14/2014   PLT 182 10/10/2013   NA 142 12/31/2016   K 4.1 12/31/2016   CL 104 07/14/2014   CO2 29 12/31/2016   GLUCOSE 135 12/31/2016   BUN 11.1 12/31/2016   CREATININE 0.9 12/31/2016   BILITOT 0.49 12/31/2016   ALKPHOS 129 12/31/2016   AST 28 12/31/2016   ALT 22 12/31/2016   PROT 7.9 12/31/2016   ALBUMIN 4.0 12/31/2016   CALCIUM 9.9 12/31/2016   GFRAA 75 (L) 10/10/2013    Speciality Comments: History of radiation therapy-cannot take Tymlos or Forteo.  Procedures:  No procedures performed Allergies: Lisinopril, Metoprolol, and Ondansetron   Assessment / Plan:     Visit Diagnoses: Age-related osteoporosis without current pathological fracture - DEXA 07/31/21: BMD forearm 0.514 with T-score - 4.2.  T score left femur total -3.2, BMD 0.602, June 30, 2019 T score -2.9 and BMD 0.637.we had a detailed discussion during the different treatment options  with the patient at the last visit.  After discussing indications side effects contraindications including the risk of osteonecrosis of the jaw and atypical femoral fracture patient was in agreement to proceed with Prolia injections.  I will obtain following labs today.  Plan: Serum protein electrophoresis with reflex, TSH, VITAMIN D 25 Hydroxy (Vit-D Deficiency, Fractures), Parathyroid hormone, intact (no Ca), COMPLETE METABOLIC PANEL WITH GFR.  We will apply for Prolia.  Once approved we will schedule an appointment for the injection.  I discussed with the patient to see her dentist prior to the Prolia injections in case she needs any dental extraction.  Fatigue-we will check TSH today.  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  She would not be able to take Forteo or Tymlos.  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  She has partial dentures and needs evaluation by her dentist for the remaining seven teeth .  Primary hypertension-her systolic blood pressure was elevated.  She was advised to monitor blood pressure closely.  History of hyperlipidemia  History of gastroesophageal reflux (GERD)  Prediabetes   Orders: No orders of the defined types were placed in this encounter.  No orders of the defined types were placed in this encounter.    Follow-Up Instructions: Return in about 3 months (around 02/01/2022) for Osteoporosis.   Bo Merino, MD  Note - This record has been created using Editor, commissioning.  Chart creation errors have been sought, but may not always  have been located. Such creation errors do not reflect on  the standard of medical care.

## 2021-10-30 ENCOUNTER — Telehealth: Payer: Self-pay

## 2021-10-30 NOTE — Telephone Encounter (Signed)
Attempted to contact patient and left message on machine to advise patient to call the office.   Need to cancel follow up on 11/01/2021 per Dr. Estanislado Pandy. Patient will be following up with Dr. Marisue Humble per last telephone note.

## 2021-11-01 ENCOUNTER — Ambulatory Visit: Payer: Medicare Other | Admitting: Rheumatology

## 2021-11-01 ENCOUNTER — Encounter: Payer: Self-pay | Admitting: Rheumatology

## 2021-11-01 ENCOUNTER — Other Ambulatory Visit: Payer: Self-pay

## 2021-11-01 VITALS — BP 159/77 | HR 64 | Ht 62.0 in | Wt 131.0 lb

## 2021-11-01 DIAGNOSIS — R7303 Prediabetes: Secondary | ICD-10-CM

## 2021-11-01 DIAGNOSIS — I10 Essential (primary) hypertension: Secondary | ICD-10-CM | POA: Diagnosis not present

## 2021-11-01 DIAGNOSIS — R5383 Other fatigue: Secondary | ICD-10-CM | POA: Diagnosis not present

## 2021-11-01 DIAGNOSIS — M26609 Unspecified temporomandibular joint disorder, unspecified side: Secondary | ICD-10-CM

## 2021-11-01 DIAGNOSIS — M810 Age-related osteoporosis without current pathological fracture: Secondary | ICD-10-CM

## 2021-11-01 DIAGNOSIS — C259 Malignant neoplasm of pancreas, unspecified: Secondary | ICD-10-CM | POA: Diagnosis not present

## 2021-11-01 DIAGNOSIS — Z8639 Personal history of other endocrine, nutritional and metabolic disease: Secondary | ICD-10-CM | POA: Diagnosis not present

## 2021-11-01 DIAGNOSIS — Z8719 Personal history of other diseases of the digestive system: Secondary | ICD-10-CM | POA: Diagnosis not present

## 2021-11-01 DIAGNOSIS — K8681 Exocrine pancreatic insufficiency: Secondary | ICD-10-CM

## 2021-11-01 NOTE — Telephone Encounter (Signed)
Attempted to contact patient and left message on machine to advise patient to call the office.  

## 2021-11-01 NOTE — Patient Instructions (Signed)
Denosumab injection ?What is this medication? ?DENOSUMAB (den oh sue mab) slows bone breakdown. Prolia is used to treat osteoporosis in women after menopause and in men, and in people who are taking corticosteroids for 6 months or more. Xgeva is used to treat a high calcium level due to cancer and to prevent bone fractures and other bone problems caused by multiple myeloma or cancer bone metastases. Xgeva is also used to treat giant cell tumor of the bone. ?This medicine may be used for other purposes; ask your health care provider or pharmacist if you have questions. ?COMMON BRAND NAME(S): Prolia, XGEVA ?What should I tell my care team before I take this medication? ?They need to know if you have any of these conditions: ?dental disease ?having surgery or tooth extraction ?infection ?kidney disease ?low levels of calcium or Vitamin D in the blood ?malnutrition ?on hemodialysis ?skin conditions or sensitivity ?thyroid or parathyroid disease ?an unusual reaction to denosumab, other medicines, foods, dyes, or preservatives ?pregnant or trying to get pregnant ?breast-feeding ?How should I use this medication? ?This medicine is for injection under the skin. It is given by a health care professional in a hospital or clinic setting. ?A special MedGuide will be given to you before each treatment. Be sure to read this information carefully each time. ?For Prolia, talk to your pediatrician regarding the use of this medicine in children. Special care may be needed. For Xgeva, talk to your pediatrician regarding the use of this medicine in children. While this drug may be prescribed for children as young as 13 years for selected conditions, precautions do apply. ?Overdosage: If you think you have taken too much of this medicine contact a poison control center or emergency room at once. ?NOTE: This medicine is only for you. Do not share this medicine with others. ?What if I miss a dose? ?It is important not to miss your dose.  Call your doctor or health care professional if you are unable to keep an appointment. ?What may interact with this medication? ?Do not take this medicine with any of the following medications: ?other medicines containing denosumab ?This medicine may also interact with the following medications: ?medicines that lower your chance of fighting infection ?steroid medicines like prednisone or cortisone ?This list may not describe all possible interactions. Give your health care provider a list of all the medicines, herbs, non-prescription drugs, or dietary supplements you use. Also tell them if you smoke, drink alcohol, or use illegal drugs. Some items may interact with your medicine. ?What should I watch for while using this medication? ?Visit your doctor or health care professional for regular checks on your progress. Your doctor or health care professional may order blood tests and other tests to see how you are doing. ?Call your doctor or health care professional for advice if you get a fever, chills or sore throat, or other symptoms of a cold or flu. Do not treat yourself. This drug may decrease your body's ability to fight infection. Try to avoid being around people who are sick. ?You should make sure you get enough calcium and vitamin D while you are taking this medicine, unless your doctor tells you not to. Discuss the foods you eat and the vitamins you take with your health care professional. ?See your dentist regularly. Brush and floss your teeth as directed. Before you have any dental work done, tell your dentist you are receiving this medicine. ?Do not become pregnant while taking this medicine or for 5 months after   stopping it. Talk with your doctor or health care professional about your birth control options while taking this medicine. Women should inform their doctor if they wish to become pregnant or think they might be pregnant. There is a potential for serious side effects to an unborn child. Talk to  your health care professional or pharmacist for more information. ?What side effects may I notice from receiving this medication? ?Side effects that you should report to your doctor or health care professional as soon as possible: ?allergic reactions like skin rash, itching or hives, swelling of the face, lips, or tongue ?bone pain ?breathing problems ?dizziness ?jaw pain, especially after dental work ?redness, blistering, peeling of the skin ?signs and symptoms of infection like fever or chills; cough; sore throat; pain or trouble passing urine ?signs of low calcium like fast heartbeat, muscle cramps or muscle pain; pain, tingling, numbness in the hands or feet; seizures ?unusual bleeding or bruising ?unusually weak or tired ?Side effects that usually do not require medical attention (report to your doctor or health care professional if they continue or are bothersome): ?constipation ?diarrhea ?headache ?joint pain ?loss of appetite ?muscle pain ?runny nose ?tiredness ?upset stomach ?This list may not describe all possible side effects. Call your doctor for medical advice about side effects. You may report side effects to FDA at 1-800-FDA-1088. ?Where should I keep my medication? ?This medicine is only given in a clinic, doctor's office, or other health care setting and will not be stored at home. ?NOTE: This sheet is a summary. It may not cover all possible information. If you have questions about this medicine, talk to your doctor, pharmacist, or health care provider. ?? 2022 Elsevier/Gold Standard (2017-12-26 00:00:00) ? ?

## 2021-11-01 NOTE — Telephone Encounter (Signed)
Patient was in the office today for an appointment and also had labs drawn. Per Dr. Estanislado Pandy, she will be getting some dental work done prior to the prolia injection.  ?

## 2021-11-02 NOTE — Progress Notes (Signed)
Vitamin D is low.  Please call in vitamin D 50,000 units once a week for 3 months.  Repeat vitamin D level in 3 months.  Glucose is elevated at 179.  TSH is normal.

## 2021-11-05 ENCOUNTER — Other Ambulatory Visit: Payer: Self-pay | Admitting: *Deleted

## 2021-11-05 DIAGNOSIS — M810 Age-related osteoporosis without current pathological fracture: Secondary | ICD-10-CM

## 2021-11-05 DIAGNOSIS — E559 Vitamin D deficiency, unspecified: Secondary | ICD-10-CM

## 2021-11-05 MED ORDER — VITAMIN D (ERGOCALCIFEROL) 1.25 MG (50000 UNIT) PO CAPS: 50000.0000 [IU] | ORAL_CAPSULE | ORAL | 0 refills | Status: DC

## 2021-11-05 NOTE — Telephone Encounter (Signed)
-----   Message from Bo Merino, MD sent at 11/02/2021  8:02 AM EST ----- ?Vitamin D is low.  Please call in vitamin D 50,000 units once a week for 3 months.  Repeat vitamin D level in 3 months.  Glucose is elevated at 179.  TSH is normal. ?

## 2021-11-06 DIAGNOSIS — R1030 Lower abdominal pain, unspecified: Secondary | ICD-10-CM | POA: Diagnosis not present

## 2021-11-08 LAB — PROTEIN ELECTROPHORESIS, SERUM, WITH REFLEX
Albumin ELP: 4.1 g/dL (ref 3.8–4.8)
Alpha 1: 0.3 g/dL (ref 0.2–0.3)
Alpha 2: 0.8 g/dL (ref 0.5–0.9)
Beta 2: 0.4 g/dL (ref 0.2–0.5)
Beta Globulin: 0.5 g/dL (ref 0.4–0.6)
Gamma Globulin: 1.1 g/dL (ref 0.8–1.7)
Total Protein: 7.2 g/dL (ref 6.1–8.1)

## 2021-11-08 LAB — COMPLETE METABOLIC PANEL WITH GFR
AG Ratio: 1.4 (calc) (ref 1.0–2.5)
ALT: 18 U/L (ref 6–29)
AST: 26 U/L (ref 10–35)
Albumin: 4.2 g/dL (ref 3.6–5.1)
Alkaline phosphatase (APISO): 98 U/L (ref 37–153)
BUN: 18 mg/dL (ref 7–25)
CO2: 30 mmol/L (ref 20–32)
Calcium: 9.6 mg/dL (ref 8.6–10.4)
Chloride: 101 mmol/L (ref 98–110)
Creat: 0.93 mg/dL (ref 0.60–1.00)
Globulin: 2.9 g/dL (calc) (ref 1.9–3.7)
Glucose, Bld: 179 mg/dL — ABNORMAL HIGH (ref 65–99)
Potassium: 3.7 mmol/L (ref 3.5–5.3)
Sodium: 139 mmol/L (ref 135–146)
Total Bilirubin: 0.6 mg/dL (ref 0.2–1.2)
Total Protein: 7.1 g/dL (ref 6.1–8.1)
eGFR: 63 mL/min/{1.73_m2} (ref >=60)

## 2021-11-08 LAB — TSH: TSH: 2.76 mIU/L (ref 0.40–4.50)

## 2021-11-08 LAB — VITAMIN D 25 HYDROXY (VIT D DEFICIENCY, FRACTURES): Vit D, 25-Hydroxy: 19 ng/mL — ABNORMAL LOW (ref 30–100)

## 2021-11-08 LAB — PARATHYROID HORMONE, INTACT (NO CA): PTH: 121 pg/mL — ABNORMAL HIGH (ref 16–77)

## 2021-11-08 NOTE — Progress Notes (Addendum)
SPEP is normal.  Patient will let us know when she is ready for the treatment after her visit with the dentist. ?

## 2021-11-09 ENCOUNTER — Other Ambulatory Visit: Payer: Self-pay | Admitting: Family Medicine

## 2021-11-09 DIAGNOSIS — R1030 Lower abdominal pain, unspecified: Secondary | ICD-10-CM

## 2021-11-09 DIAGNOSIS — R319 Hematuria, unspecified: Secondary | ICD-10-CM

## 2021-11-19 NOTE — Telephone Encounter (Signed)
Patient will be calling back once she has completed dental work. ? ?Knox Saliva, PharmD, MPH, BCPS ?Clinical Pharmacist (Rheumatology and Pulmonology) ?

## 2021-12-07 ENCOUNTER — Ambulatory Visit
Admission: RE | Admit: 2021-12-07 | Discharge: 2021-12-07 | Disposition: A | Discharge status: Home / Self Care | Payer: Medicare Other | Source: Ambulatory Visit | Attending: Family Medicine | Admitting: Family Medicine

## 2021-12-07 DIAGNOSIS — K439 Ventral hernia without obstruction or gangrene: Secondary | ICD-10-CM | POA: Diagnosis not present

## 2021-12-07 DIAGNOSIS — K6389 Other specified diseases of intestine: Secondary | ICD-10-CM | POA: Diagnosis not present

## 2021-12-07 DIAGNOSIS — R1030 Lower abdominal pain, unspecified: Secondary | ICD-10-CM

## 2021-12-07 DIAGNOSIS — R3129 Other microscopic hematuria: Secondary | ICD-10-CM | POA: Diagnosis not present

## 2021-12-07 DIAGNOSIS — R319 Hematuria, unspecified: Secondary | ICD-10-CM

## 2021-12-07 DIAGNOSIS — Z8507 Personal history of malignant neoplasm of pancreas: Secondary | ICD-10-CM | POA: Diagnosis not present

## 2021-12-07 MED ORDER — IOPAMIDOL (ISOVUE-300) INJECTION 61%
100.0000 mL | Freq: Once | INTRAVENOUS | Status: AC | PRN
  Administered 2021-12-07: 100 mL via INTRAVENOUS

## 2022-01-01 NOTE — Progress Notes (Signed)
? ?Office Visit Note ? ?Patient: Sheila Hurst             ?Date of Birth: 01/02/1941           ?MRN: [id number]             ?PCP: Gaynelle Arabian, MD ?Referring: Gaynelle Arabian, MD ?Visit Date: 01/15/2022 ?Occupation: '@GUAROCC'$ @ ? ?Subjective:  ?Treatment of osteoporosis ? ?History of Present Illness: Sheila Hurst is a 81 y.o. female with history of osteoporosis.  She had recent dental extraction for a broken tooth.  She is ready to start on Prolia injection.  She was diagnosed with vitamin D deficiency and currently taking vitamin D 50,000 units/week since March.  She has been taking calcium supplement. ? ?Activities of Daily Living:  ?Patient reports morning stiffness for a few minutes.   ?Patient Denies nocturnal pain.  ?Difficulty dressing/grooming: Denies ?Difficulty climbing stairs: Reports ?Difficulty getting out of chair: Reports ?Difficulty using hands for taps, buttons, cutlery, and/or writing: Denies ? ?Review of Systems  ?Constitutional:  Negative for fatigue.  ?HENT:  Negative for mouth sores, mouth dryness and nose dryness.   ?Eyes:  Negative for pain, itching and dryness.  ?Respiratory:  Negative for shortness of breath and difficulty breathing.   ?Cardiovascular:  Negative for chest pain and palpitations.  ?Gastrointestinal:  Negative for blood in stool, constipation and diarrhea.  ?Endocrine: Negative for increased urination.  ?Genitourinary:  Negative for difficulty urinating.  ?Musculoskeletal:  Positive for joint pain, joint pain and morning stiffness. Negative for joint swelling, myalgias, muscle tenderness and myalgias.  ?Skin:  Negative for color change, rash and redness.  ?Allergic/Immunologic: Negative for susceptible to infections.  ?Neurological:  Negative for dizziness, numbness, headaches, memory loss and weakness.  ?Hematological:  Negative for bruising/bleeding tendency.  ?Psychiatric/Behavioral:  Negative for confusion.   ? ?PMFS History:  ?Patient Active Problem List   ? Diagnosis Date Noted  ? Memory loss 07/17/2015  ? Malignant neoplasm of pancreas ([place]) 07/17/2015  ? 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 Hyperlipidemia   ? Arthritis   ? GERD (gastroesophageal reflux disease)   ? Hypertension   ? Pancreatic adenocarcinoma ([place]) 09/29/2012  ?  ?Past Medical History:  ?Diagnosis Date  ? Arthritis   ? left knee  ? Bronchitis   ? GERD (gastroesophageal reflux disease)   ? no medication  ? Hyperlipidemia   ? Hypertension   ? Pancreatic mass 09/15/2012  ? Head of Pancreas, malignant cells consistent w/adenocarcinoma  ? Radiation 10/19/12-11/30/12  ? 50.4 gray Pancreatic Ca/abdomen  ? Wears dentures   ? top  ? Wears glasses   ? Weight loss   ? Prior to Dx  ?  ?Family History  ?Problem Relation Age of Onset  ? Alzheimer's disease Mother   ? Throat cancer Father   ? Rheum arthritis Sister   ? Heart murmur Sister   ? Osteoarthritis Sister   ? Prostate cancer Brother   ? Prostate cancer Brother   ? Stomach cancer Brother   ? Thyroid disease Brother   ? Heart murmur Brother   ? Alzheimer's disease Maternal Aunt   ? Pancreatic cancer Paternal Aunt   ? Alzheimer's disease Paternal Uncle   ? ?Past Surgical History:  ?Procedure Laterality Date  ? APPENDECTOMY  70's  ? Badder tack  09-2011  ? [place]    ? CARDIAC CATHETERIZATION  12/2006  ?  CHOLECYSTECTOMY  12/2012  ? EUS  09/15/2012  ? Procedure: ESOPHAGEAL ENDOSCOPIC ULTRASOUND (EUS) RADIAL;  Surgeon: Arta Silence, MD;  [facility];  Service: Endoscopy;  Laterality: N/A;  + or - fna unsure of what scope  ? FINE NEEDLE ASPIRATION  09/15/2012  ? Procedure: FINE NEEDLE ASPIRATION (FNA) LINEAR;  Surgeon: Arta Silence, MD;  [facility];  Service: Endoscopy;  Laterality: N/A;  ? LAPAROSCOPY N/A 01/28/2013  ? Procedure: LAPAROSCOPY DIAGNOSTIC,  WHIPPLE;  Surgeon: Stark Klein, MD;  [facility];  Service: General;  Laterality: N/A;  ? PANCREATICODUODENECTOMY  12/2012  ? PORT-A-CATH REMOVAL Right 07/14/2014  ? Procedure: REMOVAL PORT-A-CATH;  Surgeon: Stark Klein, MD;  Location: [facility];  Service: General;  Laterality: Right;  ? PORTACATH PLACEMENT N/A 03/18/2013  ? Procedure: INSERTION PORT-A-CATH;  Surgeon: Stark Klein, MD;  Location: [facility];  Service: General;  Laterality: N/A;  Flouro time 55mnute, 11 seconds.  ? TONSILLECTOMY    ? age 81 ? TOTAL ABDOMINAL HYSTERECTOMY W/ BILATERAL SALPINGOOPHORECTOMY  1993  ? TUBAL LIGATION  1977  ? WHIPPLE PROCEDURE N/A 01/28/2013  ? Procedure: WHIPPLE PROCEDURE;  Surgeon: FStark Klein MD;  [facility];  Service: General;  Laterality: N/A;  ? ?Social History  ? ?Social History Narrative  ? Widowed-lives alone  ? 1 son , 1 daughter  ? Retired-customer service with [employer] ? ?Immunization History  ?Administered Date(s) Administered  ? Influenza-Unspecified 05/07/2018  ? Moderna SARS-COV2 Booster Vaccination 09/14/2020  ? Moderna Sars-Covid-2 Vaccination 10/25/2019, 11/23/2019  ?  ? ?Objective: ?Vital Signs: BP (!) 164/82 (BP Location: Left Arm, Patient Position: Sitting, Cuff Size: Normal)   Pulse (!) 54   Ht 5' (1.524 m)   Wt 130 lb 12.8 oz (59.3 kg)   BMI 25.55 kg/m?   ? ?Physical Exam ?Vitals and nursing note reviewed.  ?Constitutional:   ?   Appearance: She is well-developed.  ?HENT:  ?   Head: Normocephalic and atraumatic.  ?Eyes:  ?   Conjunctiva/sclera: Conjunctivae normal.  ?Cardiovascular:  ?   Rate and Rhythm: Normal rate and regular rhythm.  ?   Heart sounds: Normal heart sounds.  ?Pulmonary:  ?   Effort: Pulmonary effort is normal.  ?   Breath sounds: Normal breath sounds.  ?Abdominal:  ?   General: Bowel sounds are normal.  ?   Palpations: Abdomen is soft.  ?Musculoskeletal:  ?   Cervical back: Normal range of motion.  ?Lymphadenopathy:  ?   Cervical: No cervical adenopathy.  ?Skin: ?   General:  Skin is warm and dry.  ?   Capillary Refill: Capillary refill takes less than 2 seconds.  ?Neurological:  ?   Mental Status: She is alert and oriented to person, place, and time.  ?Psychiatric:     ?   Behavior: Behavior normal.  ?  ? ?Musculoskeletal Exam: C-spine was in good range of motion.  Thoracic kyphosis noted.  Noted point tenderness was over thoracic or lumbar spine.  Shoulder joints, elbow joints, wrist joints, MCPs PIPs and DIPs and good range of motion with no synovitis.  Hip joints and knee joints with good range of motion.  There was no tenderness over ankles or MTPs. ? ?CDAI Exam: ?CDAI Score: -- ?Patient Global: --; Provider Global: -- ?Swollen: --; Tender: -- ?Joint Exam 01/15/2022  ? ?No joint exam has been documented for this visit  ? ?There is currently no information documented on the homunculus.  Go to the Rheumatology activity and complete the homunculus joint exam. ? ?Investigation: ?No additional findings. ? ?Imaging: ?No results found. ? ?Recent Labs: ?Lab Results  ?Component Value Date  ? WBC 3.5 (L) 10/10/2013  ? HGB 14.6 07/14/2014  ? PLT 182 10/10/2013  ? NA 139 11/01/2021  ? K 3.7 11/01/2021  ? CL 101 11/01/2021  ? CO2 30 11/01/2021  ? GLUCOSE 179 (H) 11/01/2021  ? BUN 18 11/01/2021  ? CREATININE 0.93 11/01/2021  ? BILITOT 0.6 11/01/2021  ? ALKPHOS 129 12/31/2016  ? AST 26 11/01/2021  ? ALT 18 11/01/2021  ? PROT 7.2 11/01/2021  ? PROT 7.1 11/01/2021  ? ALBUMIN 4.0 12/31/2016  ? CALCIUM 9.6 11/01/2021  ? GFRAA 75 (L) 10/10/2013  ? ? ?Speciality Comments: History of radiation therapy-cannot take Tymlos or Forteo. ? ?Procedures:  ?No procedures performed ?Allergies: Lisinopril, Metoprolol, and Ondansetron  ? ?Assessment / Plan:     ?Visit Diagnoses: Age-related osteoporosis without current pathological fracture - DEXA 07/31/21: BMD forearm 0.514 with T-score - 4.2.  T score left femur total -3.2, BMD 0.602, June 30, 2019 T score -2.9 and BMD 0.637 -the plan is to start her on Prolia  injections.  The Prolia injection was delayed because of recent dental extraction.  She also had vitamin D deficiency.  She is currently on vitamin D 50,000 units once a week.  We will check labs today.  We wi

## 2022-01-15 ENCOUNTER — Ambulatory Visit: Payer: Medicare Other | Admitting: Rheumatology

## 2022-01-15 ENCOUNTER — Encounter: Payer: Self-pay | Admitting: Rheumatology

## 2022-01-15 ENCOUNTER — Telehealth: Payer: Self-pay | Admitting: Pharmacist

## 2022-01-15 VITALS — BP 164/82 | HR 54 | Ht 60.0 in | Wt 130.8 lb

## 2022-01-15 DIAGNOSIS — M810 Age-related osteoporosis without current pathological fracture: Secondary | ICD-10-CM | POA: Diagnosis not present

## 2022-01-15 DIAGNOSIS — I10 Essential (primary) hypertension: Secondary | ICD-10-CM

## 2022-01-15 DIAGNOSIS — Z8719 Personal history of other diseases of the digestive system: Secondary | ICD-10-CM | POA: Diagnosis not present

## 2022-01-15 DIAGNOSIS — M26609 Unspecified temporomandibular joint disorder, unspecified side: Secondary | ICD-10-CM

## 2022-01-15 DIAGNOSIS — R7303 Prediabetes: Secondary | ICD-10-CM | POA: Diagnosis not present

## 2022-01-15 DIAGNOSIS — E559 Vitamin D deficiency, unspecified: Secondary | ICD-10-CM

## 2022-01-15 DIAGNOSIS — Z8639 Personal history of other endocrine, nutritional and metabolic disease: Secondary | ICD-10-CM

## 2022-01-15 DIAGNOSIS — C259 Malignant neoplasm of pancreas, unspecified: Secondary | ICD-10-CM | POA: Diagnosis not present

## 2022-01-15 DIAGNOSIS — K8681 Exocrine pancreatic insufficiency: Secondary | ICD-10-CM | POA: Diagnosis not present

## 2022-01-15 DIAGNOSIS — R5383 Other fatigue: Secondary | ICD-10-CM

## 2022-01-15 NOTE — Telephone Encounter (Signed)
Patient had OV today and will be starting Prolia now that dental work was completed >1 month ago. She thought she was getting Proia today.  She does not have any further upcoming dental work. ? ?After discussion with patient regarding insurance benefits, she'd like to receive at [facility] and set up payment plan with them since she cannot pay for medication in lump sum through pharmacy benefit. ? ?Knox Saliva, PharmD, MPH, BCPS, CPP ?Clinical Pharmacist (Rheumatology and Pulmonology) ?

## 2022-01-15 NOTE — Patient Instructions (Signed)
After you finish the prescription for vitamin D please take over-the-counter vitamin D 2000 units daily. ?

## 2022-01-16 ENCOUNTER — Other Ambulatory Visit: Payer: Self-pay | Admitting: Pharmacist

## 2022-01-16 ENCOUNTER — Telehealth: Payer: Self-pay | Admitting: Rheumatology

## 2022-01-16 DIAGNOSIS — M810 Age-related osteoporosis without current pathological fracture: Secondary | ICD-10-CM

## 2022-01-16 LAB — COMPLETE METABOLIC PANEL WITH GFR
AG Ratio: 1.4 (calc) (ref 1.0–2.5)
ALT: 14 U/L (ref 6–29)
AST: 21 U/L (ref 10–35)
Albumin: 3.9 g/dL (ref 3.6–5.1)
Alkaline phosphatase (APISO): 91 U/L (ref 37–153)
BUN: 20 mg/dL (ref 7–25)
CO2: 32 mmol/L (ref 20–32)
Calcium: 9.3 mg/dL (ref 8.6–10.4)
Chloride: 102 mmol/L (ref 98–110)
Creat: 0.91 mg/dL (ref 0.60–1.00)
Globulin: 2.8 g/dL (calc) (ref 1.9–3.7)
Glucose, Bld: 106 mg/dL — ABNORMAL HIGH (ref 65–99)
Potassium: 4.4 mmol/L (ref 3.5–5.3)
Sodium: 140 mmol/L (ref 135–146)
Total Bilirubin: 0.5 mg/dL (ref 0.2–1.2)
Total Protein: 6.7 g/dL (ref 6.1–8.1)
eGFR: 64 mL/min/{1.73_m2} (ref >=60)

## 2022-01-16 LAB — VITAMIN D 25 HYDROXY (VIT D DEFICIENCY, FRACTURES): Vit D, 25-Hydroxy: 39 ng/mL (ref 30–100)

## 2022-01-16 NOTE — Telephone Encounter (Signed)
Patient called requesting prescription refill of Vitamin D (3 month supply) to be sent to [facility] on [address].  Patient states the Vitamin D needs to be prescribed by Dr. Estanislado Pandy in order for insurance to pay for it.   ?

## 2022-01-16 NOTE — Telephone Encounter (Signed)
Patient returned call to the office. Patient advised we will not be sending in a prescription for Vitamin D as her level was normal. Patient advised she is to take the maintenance dose which she can get over the counter. Patient expressed understanding.   ?

## 2022-01-16 NOTE — Progress Notes (Addendum)
Conference called with patient and Medical Day scheduling team. ? ?Patient is scheduled for 01/22/22 @ 11am. Left VM for patient's daughter, Tammi Klippel, to reach out to patient for details ? ?Knox Saliva, PharmD, MPH, BCPS, CPP ?Clinical Pharmacist (Rheumatology and Pulmonology) ?

## 2022-01-16 NOTE — Telephone Encounter (Signed)
Attempted to contact the patient and left message for patient to call the office.  

## 2022-01-16 NOTE — Telephone Encounter (Signed)
Vitamin D and calcium wnl to proceed with Prolia. Order placed with [facility] for patient to have completed at hospital through medical benefits ? ?Knox Saliva, PharmD, MPH, BCPS, CPP ?Clinical Pharmacist (Rheumatology and Pulmonology) ?

## 2022-01-16 NOTE — Progress Notes (Signed)
Patient will be newly starting Prolia and due for updated orders. She is transitioning from alendronate and after dental procedures. ? ?Diagnosis: age-related osteoporosis without current fracture ? ?Dose: '60mg'$  SQ every 6 months ? ?Last Clinic Visit: 01/15/22 ?Next Clinic Visit: not yet scheduled ? ?Labs: CMP and Vitamin D on 01/16/22 wnl to proceed with Prolia ? ?Orders placed for Prolia SQ x 1 dose. No premeds required. ? ?Called patient and provided with phone number for: ?[facility] [phone]) [place]. Reviewed instructions and that she will have to provide her name and name of medication to have appt scheduled. ? ?Will follow-up to ensured scheduled and completed  ? ?Knox Saliva, PharmD, MPH, BCPS, CPP ?Clinical Pharmacist (Rheumatology and Pulmonology) ?

## 2022-01-22 ENCOUNTER — Ambulatory Visit (HOSPITAL_COMMUNITY)
Admission: RE | Admit: 2022-01-22 | Discharge: 2022-01-22 | Disposition: A | Discharge status: Home / Self Care | Payer: Medicare Other | Source: Ambulatory Visit | Attending: Rheumatology | Admitting: Rheumatology

## 2022-01-22 ENCOUNTER — Encounter (HOSPITAL_COMMUNITY): Payer: Medicare Other

## 2022-01-22 DIAGNOSIS — M810 Age-related osteoporosis without current pathological fracture: Secondary | ICD-10-CM | POA: Diagnosis not present

## 2022-01-22 MED ORDER — DENOSUMAB 60 MG/ML ~~LOC~~ SOSY
PREFILLED_SYRINGE | SUBCUTANEOUS | Status: AC
  Filled 2022-01-22: qty 1

## 2022-01-22 MED ORDER — DENOSUMAB 60 MG/ML ~~LOC~~ SOSY
60.0000 mg | PREFILLED_SYRINGE | Freq: Once | SUBCUTANEOUS | Status: AC
  Administered 2022-01-22: 60 mg via SUBCUTANEOUS

## 2022-01-25 ENCOUNTER — Other Ambulatory Visit: Payer: Self-pay | Admitting: Physician Assistant

## 2022-01-25 DIAGNOSIS — E559 Vitamin D deficiency, unspecified: Secondary | ICD-10-CM

## 2022-01-25 DIAGNOSIS — M810 Age-related osteoporosis without current pathological fracture: Secondary | ICD-10-CM

## 2022-04-01 DIAGNOSIS — H6123 Impacted cerumen, bilateral: Secondary | ICD-10-CM | POA: Diagnosis not present

## 2022-04-09 DIAGNOSIS — R110 Nausea: Secondary | ICD-10-CM | POA: Diagnosis not present

## 2022-04-09 DIAGNOSIS — R600 Localized edema: Secondary | ICD-10-CM | POA: Diagnosis not present

## 2022-04-09 DIAGNOSIS — R42 Dizziness and giddiness: Secondary | ICD-10-CM | POA: Diagnosis not present

## 2022-05-02 ENCOUNTER — Other Ambulatory Visit: Payer: Self-pay | Admitting: Family Medicine

## 2022-05-02 DIAGNOSIS — Z1231 Encounter for screening mammogram for malignant neoplasm of breast: Secondary | ICD-10-CM

## 2022-05-22 ENCOUNTER — Inpatient Hospital Stay: Payer: Medicare Other | Admitting: Nurse Practitioner

## 2022-05-22 ENCOUNTER — Ambulatory Visit: Payer: Medicare Other | Admitting: Oncology

## 2022-05-22 DIAGNOSIS — M1990 Unspecified osteoarthritis, unspecified site: Secondary | ICD-10-CM | POA: Diagnosis not present

## 2022-05-22 DIAGNOSIS — E7800 Pure hypercholesterolemia, unspecified: Secondary | ICD-10-CM | POA: Diagnosis not present

## 2022-05-22 DIAGNOSIS — Z23 Encounter for immunization: Secondary | ICD-10-CM | POA: Diagnosis not present

## 2022-05-22 DIAGNOSIS — I10 Essential (primary) hypertension: Secondary | ICD-10-CM | POA: Diagnosis not present

## 2022-05-22 DIAGNOSIS — R7303 Prediabetes: Secondary | ICD-10-CM | POA: Diagnosis not present

## 2022-05-22 DIAGNOSIS — M810 Age-related osteoporosis without current pathological fracture: Secondary | ICD-10-CM | POA: Diagnosis not present

## 2022-05-22 DIAGNOSIS — I700 Atherosclerosis of aorta: Secondary | ICD-10-CM | POA: Diagnosis not present

## 2022-05-22 DIAGNOSIS — Z8507 Personal history of malignant neoplasm of pancreas: Secondary | ICD-10-CM | POA: Diagnosis not present

## 2022-05-22 DIAGNOSIS — Z0000 Encounter for general adult medical examination without abnormal findings: Secondary | ICD-10-CM | POA: Diagnosis not present

## 2022-05-29 ENCOUNTER — Encounter: Payer: Self-pay | Admitting: Nurse Practitioner

## 2022-05-29 ENCOUNTER — Inpatient Hospital Stay: Payer: Medicare Other | Attending: Oncology | Admitting: Nurse Practitioner

## 2022-05-29 VITALS — BP 171/72 | HR 97 | Temp 98.2°F | Resp 18 | Ht 60.0 in | Wt 131.6 lb

## 2022-05-29 DIAGNOSIS — C250 Malignant neoplasm of head of pancreas: Secondary | ICD-10-CM | POA: Diagnosis not present

## 2022-05-29 DIAGNOSIS — E785 Hyperlipidemia, unspecified: Secondary | ICD-10-CM | POA: Diagnosis not present

## 2022-05-29 DIAGNOSIS — Z923 Personal history of irradiation: Secondary | ICD-10-CM | POA: Diagnosis not present

## 2022-05-29 DIAGNOSIS — Z8507 Personal history of malignant neoplasm of pancreas: Secondary | ICD-10-CM | POA: Diagnosis not present

## 2022-05-29 DIAGNOSIS — I10 Essential (primary) hypertension: Secondary | ICD-10-CM | POA: Insufficient documentation

## 2022-05-29 NOTE — Progress Notes (Signed)
  [facility] OFFICE PROGRESS NOTE   Diagnosis: Pancreas cancer  INTERVAL HISTORY:   Ms. Sheila Hurst returns as scheduled.  Other than a "bum" left knee she feels well.  She has a good appetite.  Weight is stable.  No abdominal pain.  No nausea or vomiting.  No diarrhea.  Objective:  Vital signs in last 24 hours:  Blood pressure (!) 171/72, pulse 97, temperature 98.2 F (36.8 C), temperature source Oral, resp. rate 18, height 5' (1.524 m), weight 131 lb 9.6 oz (59.7 kg), SpO2 98 %.    Lymphatics: No palpable cervical, supraclavicular, axillary or inguinal lymph nodes. Resp: Lungs clear bilaterally. Cardio: Regular rate and rhythm. GI: Abdomen soft and nontender.  No hepatosplenomegaly. Vascular: No leg edema.   Lab Results:  Lab Results  Component Value Date   WBC 3.5 (L) 10/10/2013   HGB 14.6 07/14/2014   HCT 43.0 07/14/2014   MCV 87.7 10/10/2013   PLT 182 10/10/2013   NEUTROABS 2.2 10/10/2013    Imaging:  No results found.  Medications: I have reviewed the patient's current medications.  Assessment/Plan: Adenocarcinoma of the pancreas; mass in the neck of the pancreas, status post EUS guided biopsy 09/15/2012 with the cytology confirming malignant cells consistent with adenocarcinoma, T3 N0 by ultrasound. 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 Elevated CA 19-9. She began radiation and concurrent Xeloda chemotherapy on 10/19/2012, radiation completed 11/30/2012 -Whipple procedure 01/28/2013 with no residual carcinoma seen and stromal fibrosis consistent with post treatment effect.   -She began adjuvant gemcitabine 04/01/2013, completed 07/22/2013.  - CT abdomen/pelvis 01/01/2016 showed no evidence of local recurrence or metastatic disease. There was interval increase in the size of a ventral abdominal wall hernia. History of Pain secondary to pancreas cancer Right liver cyst. Smaller on  MRI 09/28/2012 compared to 11/03/2008. Hypertension  Hyperlipidemia. Family history of pancreas cancer (paternal aunt). Anorexia/weight loss. Resolved. Bradycardia when here on 11/11/2012. Atenolol was placed on hold. Right leg DVT documented on a Doppler 11/26/2012-no longer on anticoagulation. History of Hypokalemia. She is still taking a potassium supplement. CT abdomen/pelvis 10/10/2013 done to evaluate abdominal pain. No acute abnormality identified in the abdomen and pelvis. Postsurgical changes at the pancreas. Stable liver cyst. Port-A-Cath removal 07/14/2014      Disposition: Sheila Hurst remains in clinical remission from pancreas cancer.  She would like to continue follow-up at the [facility].  She will return for an office visit in 1 year.    Ned Card ANP/GNP-BC   05/29/2022  3:07 PM

## 2022-06-20 ENCOUNTER — Ambulatory Visit (INDEPENDENT_AMBULATORY_CARE_PROVIDER_SITE_OTHER): Payer: Medicare Other | Admitting: Orthopedic Surgery

## 2022-06-20 ENCOUNTER — Telehealth: Payer: Self-pay

## 2022-06-20 ENCOUNTER — Ambulatory Visit (INDEPENDENT_AMBULATORY_CARE_PROVIDER_SITE_OTHER): Payer: Medicare Other

## 2022-06-20 DIAGNOSIS — G8929 Other chronic pain: Secondary | ICD-10-CM | POA: Diagnosis not present

## 2022-06-20 DIAGNOSIS — M25562 Pain in left knee: Secondary | ICD-10-CM

## 2022-06-20 NOTE — Telephone Encounter (Signed)
Left knee gel injection OA had xrays

## 2022-06-21 NOTE — Telephone Encounter (Signed)
VOB submitted for Durolane, left knee.  

## 2022-06-25 ENCOUNTER — Telehealth: Payer: Self-pay

## 2022-06-25 ENCOUNTER — Encounter: Payer: Self-pay | Admitting: Orthopedic Surgery

## 2022-06-25 NOTE — Progress Notes (Signed)
Office Visit Note   Patient: Sheila Hurst           Date of Birth: 03-26-1941           MRN: [id number] Visit Date: 06/20/2022              Requested by: Gaynelle Arabian, MD [address] [place],  [place] [address] PCP: Gaynelle Arabian, MD  Chief Complaint  Patient presents with   Left Knee - Pain      HPI: Patient is an 81 year old woman with chronic osteoarthritis left knee.  Patient is currently using Tylenol arthritis.  She states she had a steroid injection previously with minimal relief.  Assessment & Plan: Visit Diagnoses:  1. Chronic pain of left knee     Plan: Patient would like to pursue injection with hyaluronic acid.  We will request authorization.  Patient does have weakness and we would need to do therapy prior to considering a total knee arthroplasty.  Follow-Up Instructions: Return if symptoms worsen or fail to improve.   Ortho Exam  Patient is alert, oriented, no adenopathy, well-dressed, normal affect, normal respiratory effort. Examination patient needs help to get from a sitting to a standing position.  She has valgus alignment to the left knee pain to palpation of the lateral joint line Clauser cruciates are stable there is crepitation with range of motion.  Imaging: No results found. No images are attached to the encounter.  Labs: Lab Results  Component Value Date   HGBA1C 5.6 01/28/2013     Lab Results  Component Value Date   ALBUMIN 4.0 12/31/2016   ALBUMIN 3.7 08/30/2016   ALBUMIN 4.0 06/06/2016    Lab Results  Component Value Date   MG 1.6 01/29/2013   Lab Results  Component Value Date   VD25OH 39 01/15/2022   VD25OH 19 (L) 11/01/2021    No results found for: "PREALBUMIN"    Latest Ref Rng & Units 07/14/2014    6:59 AM 10/10/2013    7:20 PM 07/22/2013    9:50 AM  CBC EXTENDED  WBC 4.0 - 10.5 K/uL  3.5  4.9   RBC 3.87 - 5.11 MIL/uL  3.73  3.72   Hemoglobin 12.0 - 15.0 g/dL 14.6  10.6  11.1   HCT  36.0 - 46.0 % 43.0  32.7  34.0   Platelets 150 - 400 K/uL  182  196   NEUT# 1.7 - 7.7 K/uL  2.2  3.7   Lymph# 0.7 - 4.0 K/uL  1.0  0.5      There is no height or weight on file to calculate BMI.  Orders:  Orders Placed This Encounter  Procedures   XR Knee 1-2 Views Left   No orders of the defined types were placed in this encounter.    Procedures: No procedures performed  Clinical Data: No additional findings.  ROS:  All other systems negative, except as noted in the HPI. Review of Systems  Objective: Vital Signs: There were no vitals taken for this visit.  Specialty Comments:  No specialty comments available.  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[place]) 10/01/2012   Hyperlipidemia    Arthritis    GERD (gastroesophageal reflux disease)    Hypertension  Pancreatic adenocarcinoma ([place]) 09/29/2012   Past Medical History:  Diagnosis Date   Arthritis    left knee   Bronchitis    GERD (gastroesophageal reflux disease)    no medication   Hyperlipidemia    Hypertension    Pancreatic mass 09/15/2012   Head of Pancreas, malignant cells consistent w/adenocarcinoma   Radiation 10/19/12-11/30/12   50.4 gray Pancreatic Ca/abdomen   Wears dentures    top   Wears glasses    Weight loss    Prior to Dx    Family History  Problem Relation Age of Onset   Alzheimer's disease Mother    Throat cancer Father    Rheum arthritis Sister    Heart murmur Sister    Osteoarthritis Sister    Prostate cancer Brother    Prostate cancer Brother    Stomach cancer Brother    Thyroid disease Brother    Heart murmur Brother    Alzheimer's disease Maternal Aunt    Pancreatic cancer Paternal Aunt    Alzheimer's disease Paternal Uncle      Past Surgical History:  Procedure Laterality Date   APPENDECTOMY  70's   Badder tack  09-2011   BELPHAROPTOSIS REPAIR     CARDIAC CATHETERIZATION  12/2006   CHOLECYSTECTOMY  12/2012   EUS  09/15/2012   Procedure: ESOPHAGEAL ENDOSCOPIC ULTRASOUND (EUS) RADIAL;  Surgeon: Arta Silence, MD;  [facility];  Service: Endoscopy;  Laterality: N/A;  + or - fna unsure of what scope   FINE NEEDLE ASPIRATION  09/15/2012   Procedure: FINE NEEDLE ASPIRATION (FNA) LINEAR;  Surgeon: Arta Silence, MD;  [facility];  Service: Endoscopy;  Laterality: N/A;   LAPAROSCOPY N/A 01/28/2013   Procedure: LAPAROSCOPY DIAGNOSTIC,  WHIPPLE;  Surgeon: Stark Klein, MD;  [facility];  Service: General;  Laterality: N/A;   PANCREATICODUODENECTOMY  12/2012   PORT-A-CATH REMOVAL Right 07/14/2014   Procedure: REMOVAL PORT-A-CATH;  Surgeon: Stark Klein, MD;  Location: [facility];  Service: General;  Laterality: Right;   PORTACATH PLACEMENT N/A 03/18/2013   Procedure: INSERTION PORT-A-CATH;  Surgeon: Stark Klein, MD;  Location: [facility];  Service: General;  Laterality: N/A;  Flouro time 69mnute, 11 seconds.   TONSILLECTOMY     age 81  TOTAL ABDOMINAL HYSTERECTOMY W/ BILATERAL SALPINGOOPHORECTOMY  1993   TUBAL LIGATION  1977   WHIPPLE PROCEDURE N/A 01/28/2013   Procedure: WHIPPLE PROCEDURE;  Surgeon: FStark Klein MD;  [facility];  Service: General;  Laterality: N/A;   Social History   Occupational History   Occupation: Retired    Comment: CTherapist, art Tobacco Use   Smoking status: Former    Packs/day: 0.30    Years: 24.00    Total pack years: 7.20    Types: Cigarettes    Quit date: 09/02/1978    Years since quitting: 43.8    Passive exposure: Past   Smokeless tobacco: Never  Vaping Use   Vaping Use: Never used  Substance and Sexual Activity   Alcohol use: No   Drug use: No   Sexual activity: Never

## 2022-07-01 ENCOUNTER — Other Ambulatory Visit: Payer: Self-pay | Admitting: Pharmacist

## 2022-07-01 ENCOUNTER — Telehealth: Payer: Self-pay | Admitting: Pharmacist

## 2022-07-01 DIAGNOSIS — Z5181 Encounter for therapeutic drug level monitoring: Secondary | ICD-10-CM

## 2022-07-01 DIAGNOSIS — M810 Age-related osteoporosis without current pathological fracture: Secondary | ICD-10-CM

## 2022-07-01 NOTE — Telephone Encounter (Signed)
Patient due for next Prolia on 07/21/22. Spoke with patient regarding needing updated lab work. She will place to stop by tomorrow.  Future order for CBC and CMP placed today  Knox Saliva, PharmD, MPH, BCPS, CPP Clinical Pharmacist (Rheumatology and Pulmonology)

## 2022-07-03 ENCOUNTER — Other Ambulatory Visit: Payer: Self-pay | Admitting: *Deleted

## 2022-07-03 ENCOUNTER — Telehealth: Payer: Self-pay | Admitting: Orthopedic Surgery

## 2022-07-03 DIAGNOSIS — E559 Vitamin D deficiency, unspecified: Secondary | ICD-10-CM

## 2022-07-03 DIAGNOSIS — M810 Age-related osteoporosis without current pathological fracture: Secondary | ICD-10-CM

## 2022-07-03 DIAGNOSIS — Z5181 Encounter for therapeutic drug level monitoring: Secondary | ICD-10-CM

## 2022-07-03 NOTE — Telephone Encounter (Signed)
Patient came in asking questions about her shot, and if it was approved. I explained that I could sent a message to staff and they could reach out and answer any questions she may have. [phone]

## 2022-07-03 NOTE — Telephone Encounter (Signed)
Pt informed gel inj request has been submitted. Waiting for insurance to reply back.

## 2022-07-03 NOTE — Telephone Encounter (Signed)
Reached out to patient regarding Prolia labs. She will come now to have labs completed  Knox Saliva, PharmD, MPH, BCPS, CPP Clinical Pharmacist (Rheumatology and Pulmonology)

## 2022-07-04 ENCOUNTER — Other Ambulatory Visit: Payer: Self-pay | Admitting: *Deleted

## 2022-07-04 DIAGNOSIS — E559 Vitamin D deficiency, unspecified: Secondary | ICD-10-CM

## 2022-07-04 LAB — CBC WITH DIFFERENTIAL/PLATELET
Absolute Monocytes: 331 cells/uL (ref 200–950)
Basophils Absolute: 29 cells/uL (ref 0–200)
Basophils Relative: 0.8 %
Eosinophils Absolute: 140 cells/uL (ref 15–500)
Eosinophils Relative: 3.9 %
HCT: 35.8 % (ref 35.0–45.0)
Hemoglobin: 11.9 g/dL (ref 11.7–15.5)
Lymphs Abs: 1516 cells/uL (ref 850–3900)
MCH: 29.1 pg (ref 27.0–33.0)
MCHC: 33.2 g/dL (ref 32.0–36.0)
MCV: 87.5 fL (ref 80.0–100.0)
MPV: 10.7 fL (ref 7.5–12.5)
Monocytes Relative: 9.2 %
Neutro Abs: 1584 cells/uL (ref 1500–7800)
Neutrophils Relative %: 44 %
Platelets: 228 10*3/uL (ref 140–400)
RBC: 4.09 10*6/uL (ref 3.80–5.10)
RDW: 13 % (ref 11.0–15.0)
Total Lymphocyte: 42.1 %
WBC: 3.6 10*3/uL — ABNORMAL LOW (ref 3.8–10.8)

## 2022-07-04 LAB — COMPREHENSIVE METABOLIC PANEL
AG Ratio: 1.4 (calc) (ref 1.0–2.5)
ALT: 13 U/L (ref 6–29)
AST: 21 U/L (ref 10–35)
Albumin: 4.3 g/dL (ref 3.6–5.1)
Alkaline phosphatase (APISO): 59 U/L (ref 37–153)
BUN: 19 mg/dL (ref 7–25)
CO2: 29 mmol/L (ref 20–32)
Calcium: 10.1 mg/dL (ref 8.6–10.4)
Chloride: 101 mmol/L (ref 98–110)
Creat: 0.95 mg/dL (ref 0.60–0.95)
Globulin: 3.1 g/dL (calc) (ref 1.9–3.7)
Glucose, Bld: 133 mg/dL — ABNORMAL HIGH (ref 65–99)
Potassium: 4.1 mmol/L (ref 3.5–5.3)
Sodium: 140 mmol/L (ref 135–146)
Total Bilirubin: 0.4 mg/dL (ref 0.2–1.2)
Total Protein: 7.4 g/dL (ref 6.1–8.1)

## 2022-07-04 LAB — VITAMIN D 25 HYDROXY (VIT D DEFICIENCY, FRACTURES): Vit D, 25-Hydroxy: 18 ng/mL — ABNORMAL LOW (ref 30–100)

## 2022-07-04 MED ORDER — VITAMIN D (ERGOCALCIFEROL) 1.25 MG (50000 UNIT) PO CAPS: 50000.0000 [IU] | ORAL_CAPSULE | ORAL | 0 refills | Status: DC

## 2022-07-04 NOTE — Progress Notes (Signed)
Vitamin D is low.  Please send in vitamin D 50,000 units once weekly x12 weeks.  Recheck vitamin D in 3 months.

## 2022-07-04 NOTE — Progress Notes (Signed)
White cell count is low and stable.  Glucose is mildly elevated probably not fasting sample.

## 2022-07-04 NOTE — Telephone Encounter (Signed)
-----   Message from Ofilia Neas, PA-C sent at 07/04/2022  7:59 AM EDT ----- Vitamin D is low.  Please send in vitamin D 50,000 units once weekly x12 weeks.  Recheck vitamin D in 3 months.

## 2022-07-04 NOTE — Telephone Encounter (Signed)
CBC and CMP stable from 11/2/203. Vitamin D is low (18). Patient will start high-dose Vitamin D weekly but will need to recheck Vitamin D level in one month before moving forward with Prolia. Then recheck Vitamin D level at Vitamin D tx completion as well (3 months).  Patient reminder set to call patient for lab reminder in early December 2023  Knox Saliva, PharmD, MPH, BCPS, CPP Clinical Pharmacist (Rheumatology and Pulmonology)

## 2022-08-01 ENCOUNTER — Ambulatory Visit: Payer: Medicare Other

## 2022-08-01 DIAGNOSIS — R300 Dysuria: Secondary | ICD-10-CM | POA: Diagnosis not present

## 2022-08-01 DIAGNOSIS — I10 Essential (primary) hypertension: Secondary | ICD-10-CM | POA: Diagnosis not present

## 2022-08-14 NOTE — Telephone Encounter (Signed)
ATC patient regarding repeating Vitamin D. Unable to reach. Left VM.Marland Kitchen  Future order for Vitamin D and CMET placed today  Knox Saliva, PharmD, MPH, BCPS, CPP Clinical Pharmacist (Rheumatology and Pulmonology)

## 2022-08-20 DIAGNOSIS — Z8507 Personal history of malignant neoplasm of pancreas: Secondary | ICD-10-CM | POA: Diagnosis not present

## 2022-08-20 DIAGNOSIS — K432 Incisional hernia without obstruction or gangrene: Secondary | ICD-10-CM | POA: Diagnosis not present

## 2022-08-21 ENCOUNTER — Other Ambulatory Visit: Payer: Self-pay | Admitting: General Surgery

## 2022-08-21 DIAGNOSIS — K432 Incisional hernia without obstruction or gangrene: Secondary | ICD-10-CM

## 2022-08-21 DIAGNOSIS — Z8507 Personal history of malignant neoplasm of pancreas: Secondary | ICD-10-CM

## 2022-09-09 DIAGNOSIS — N3281 Overactive bladder: Secondary | ICD-10-CM | POA: Diagnosis not present

## 2022-09-09 DIAGNOSIS — R300 Dysuria: Secondary | ICD-10-CM | POA: Diagnosis not present

## 2022-09-09 DIAGNOSIS — L439 Lichen planus, unspecified: Secondary | ICD-10-CM | POA: Diagnosis not present

## 2022-09-09 DIAGNOSIS — M1712 Unilateral primary osteoarthritis, left knee: Secondary | ICD-10-CM | POA: Diagnosis not present

## 2022-09-21 DIAGNOSIS — R051 Acute cough: Secondary | ICD-10-CM | POA: Diagnosis not present

## 2022-09-21 DIAGNOSIS — R42 Dizziness and giddiness: Secondary | ICD-10-CM | POA: Diagnosis not present

## 2022-09-21 DIAGNOSIS — R52 Pain, unspecified: Secondary | ICD-10-CM | POA: Diagnosis not present

## 2022-09-21 DIAGNOSIS — B349 Viral infection, unspecified: Secondary | ICD-10-CM | POA: Diagnosis not present

## 2022-09-21 DIAGNOSIS — Z03818 Encounter for observation for suspected exposure to other biological agents ruled out: Secondary | ICD-10-CM | POA: Diagnosis not present

## 2022-09-22 ENCOUNTER — Other Ambulatory Visit: Payer: Self-pay | Admitting: Physician Assistant

## 2022-09-22 DIAGNOSIS — E559 Vitamin D deficiency, unspecified: Secondary | ICD-10-CM

## 2022-09-30 ENCOUNTER — Ambulatory Visit
Admission: RE | Admit: 2022-09-30 | Discharge: 2022-09-30 | Disposition: A | Discharge status: Home / Self Care | Payer: Medicare Other | Source: Ambulatory Visit | Attending: Family Medicine | Admitting: Family Medicine

## 2022-09-30 DIAGNOSIS — Z1231 Encounter for screening mammogram for malignant neoplasm of breast: Secondary | ICD-10-CM | POA: Diagnosis not present

## 2022-11-18 DIAGNOSIS — R319 Hematuria, unspecified: Secondary | ICD-10-CM | POA: Diagnosis not present

## 2022-11-18 DIAGNOSIS — R309 Painful micturition, unspecified: Secondary | ICD-10-CM | POA: Diagnosis not present

## 2022-11-27 NOTE — Progress Notes (Signed)
Office Visit Note  Patient: Sheila Hurst             Date of Birth: [birth year]           MRN: [id number]             PCP: Gaynelle Arabian, MD Referring: Gaynelle Arabian, MD Visit Date: 12/05/2022 Occupation: @GUAROCC @  Subjective:  Medication management  History of Present Illness: Sheila Hurst is a 82 y.o. female with history of osteoporosis.  She states she had her last Prolia injection on Jan 22, 2022.  She did not come back for the second Prolia injection.  She also had low vitamin D .  In November she was given a prescription for vitamin D 50,000 units once a week for 3 months.  Patient did not come back for the follow-up labs.  She states that she finished the course of vitamin D but she is not taking any over-the-counter vitamin D currently.    Activities of Daily Living:  Patient reports morning stiffness for 5-10 minutes.   Patient Denies nocturnal pain.  Difficulty dressing/grooming: Denies Difficulty climbing stairs: Reports Difficulty getting out of chair: Reports Difficulty using hands for taps, buttons, cutlery, and/or writing: Denies  Review of Systems  Constitutional:  Negative for fatigue.  HENT:  Positive for mouth dryness. Negative for mouth sores.   Eyes:  Negative for dryness.  Respiratory:  Negative for shortness of breath.   Cardiovascular:  Negative for chest pain and palpitations.  Gastrointestinal:  Negative for blood in stool, constipation and diarrhea.  Endocrine: Positive for increased urination.  Genitourinary:  Negative for involuntary urination.  Musculoskeletal:  Positive for gait problem and morning stiffness. Negative for joint pain, joint pain, joint swelling, myalgias, muscle weakness, muscle tenderness and myalgias.  Skin:  Negative for color change, rash, hair loss and sensitivity to sunlight.  Allergic/Immunologic: Negative for susceptible to infections.  Neurological:  Negative for dizziness and headaches.   Hematological:  Negative for swollen glands.  Psychiatric/Behavioral:  Negative for depressed mood and sleep disturbance. The patient is not nervous/anxious.     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   Hyperlipidemia    Arthritis    GERD (gastroesophageal reflux disease)    Hypertension    Pancreatic adenocarcinoma 09/29/2012    Past Medical History:  Diagnosis Date   Arthritis    left knee   Bronchitis    GERD (gastroesophageal reflux disease)    no medication   Hyperlipidemia    Hypertension    Pancreatic mass 09/15/2012   Head of Pancreas, malignant cells consistent w/adenocarcinoma   Radiation 10/19/12-11/30/12   50.4 gray Pancreatic Ca/abdomen   Wears dentures    top   Wears glasses    Weight loss    Prior to Dx    Family History  Problem Relation Age of Onset   Alzheimer's disease Mother    Throat cancer Father    Rheum arthritis Sister    Heart murmur Sister    Osteoarthritis Sister    Prostate cancer Brother    Prostate cancer Brother    Stomach cancer Brother    Thyroid disease Brother    Heart murmur Brother    Alzheimer's disease Maternal Aunt    Pancreatic  cancer Paternal Aunt    Alzheimer's disease Paternal Uncle    Past Surgical History:  Procedure Laterality Date   APPENDECTOMY  70's   Badder tack  09-2011   BELPHAROPTOSIS REPAIR     CARDIAC CATHETERIZATION  12/2006   CHOLECYSTECTOMY  12/2012   EUS  09/15/2012   Procedure: ESOPHAGEAL ENDOSCOPIC ULTRASOUND (EUS) RADIAL;  Surgeon: Arta Silence, MD;  Location: [facility];  Service: Endoscopy;  Laterality: N/A;  + or - fna unsure of what scope   FINE NEEDLE ASPIRATION  09/15/2012   Procedure: FINE NEEDLE ASPIRATION (FNA) LINEAR;  Surgeon:  Arta Silence, MD;  Location: [facility];  Service: Endoscopy;  Laterality: N/A;   LAPAROSCOPY N/A 01/28/2013   Procedure: LAPAROSCOPY DIAGNOSTIC,  WHIPPLE;  Surgeon: Stark Klein, MD;  Location: [facility];  Service: General;  Laterality: N/A;   PANCREATICODUODENECTOMY  12/2012   PORT-A-CATH REMOVAL Right 07/14/2014   Procedure: REMOVAL PORT-A-CATH;  Surgeon: Stark Klein, MD;  Location: [place];  Service: General;  Laterality: Right;   PORTACATH PLACEMENT N/A 03/18/2013   Procedure: INSERTION PORT-A-CATH;  Surgeon: Stark Klein, MD;  Location: [place];  Service: General;  Laterality: N/A;  Flouro time 68minute, 11 seconds.   TONSILLECTOMY     age 10   TOTAL ABDOMINAL HYSTERECTOMY W/ BILATERAL SALPINGOOPHORECTOMY  1993   TUBAL LIGATION  1977   WHIPPLE PROCEDURE N/A 01/28/2013   Procedure: WHIPPLE PROCEDURE;  Surgeon: Stark Klein, MD;  Location: [facility];  Service: General;  Laterality: N/A;   Social History   Social History Narrative   Widowed-lives alone   1 son , 1 daughter   Retired-customer service with [employer] History  Administered Date(s) Administered   Influenza-Unspecified 05/07/2018, 05/22/2022   Moderna SARS-COV2 Booster Vaccination 09/14/2020   Moderna Sars-Covid-2 Vaccination 10/25/2019, 11/23/2019   Zoster Recombinat (Shingrix) 05/22/2022     Objective: Vital Signs: BP (!) 159/73 (BP Location: Right Arm, Patient Position: Sitting, Cuff Size: Normal)   Pulse (!) 55   Resp 16   Ht 5' (1.524 m)   Wt 133 lb (60.3 kg)   BMI 25.97 kg/m    Physical Exam Vitals and nursing note reviewed.  Constitutional:      Appearance: She is well-developed.  HENT:     Head: Normocephalic and atraumatic.  Eyes:     Conjunctiva/sclera: Conjunctivae normal.  Cardiovascular:     Rate and Rhythm: Normal rate and regular rhythm.     Heart sounds: Normal heart sounds.  Pulmonary:     Effort: Pulmonary effort is normal.      Breath sounds: Normal breath sounds.  Abdominal:     General: Bowel sounds are normal.     Palpations: Abdomen is soft.  Musculoskeletal:     Cervical back: Normal range of motion.  Lymphadenopathy:     Cervical: No cervical adenopathy.  Skin:    General: Skin is warm and dry.     Capillary Refill: Capillary refill takes less than 2 seconds.  Neurological:     Mental Status: She is alert and oriented to person, place, and time.  Psychiatric:        Behavior: Behavior normal.      Musculoskeletal Exam: Cervical spine was in good range of motion.  Shoulder joints, elbow joints, wrist joints were in good range of motion.  PIP and DIP thickening was noted.  Hip joints and knee joints were in good range of motion.  No effusion was noted.  There was no tenderness over ankles or MTPs.  CDAI Exam: CDAI Score: -- Patient Global: --; Provider Global: -- Swollen: --; Tender: -- Joint Exam 12/05/2022   No joint exam has been documented for this visit   There is currently no information documented on the homunculus. Go to the Rheumatology activity and complete the homunculus joint exam.  Investigation: No additional findings.  Imaging: No results found.  Recent Labs: Lab Results  Component Value Date   WBC 3.6 (L) 07/03/2022   HGB 11.9 07/03/2022   PLT 228 07/03/2022   NA 140 07/03/2022   K 4.1 07/03/2022   CL 101 07/03/2022   CO2 29 07/03/2022   GLUCOSE 133 (H) 07/03/2022   BUN 19 07/03/2022   CREATININE 0.95 07/03/2022   BILITOT 0.4 07/03/2022   ALKPHOS 129 12/31/2016   AST 21 07/03/2022   ALT 13 07/03/2022   PROT 7.4 07/03/2022   ALBUMIN 4.0 12/31/2016   CALCIUM 10.1 07/03/2022   GFRAA 75 (L) 10/10/2013    Speciality Comments: History of radiation therapy-cannot take Tymlos or Forteo.  Procedures:  No procedures performed Allergies: Lisinopril, Metoprolol, and Ondansetron   Assessment / Plan:     Visit Diagnoses: Age-related osteoporosis without current  pathological fracture - DEXA 07/31/21: BMD forearm 0.514 with T-score - 4.2.  T score left femur total -3.2, BMD 0.602, 06/2019 T score -2.9 and BMD 0.637-she had her first Prolia injection on Jan 22, 2022.  She did not return for the second Prolia injection.  Her vitamin D was low at 62 on July 03, 2022.  Patient states she finished the 3 months course of vitamin D but has not stopped taking vitamin D since then.  I discussed the need for taking vitamin D and calcium on a regular basis while she is on Prolia.  Increased risk of hypocalcemia with Prolia and the complications of hypocalcemia were discussed at length.  Will check CMP and vitamin D levels today.  Will contact her after the results are available.  We will also prepare for next Prolia injection if the vitamin D is in the desirable range.  Medication monitoring encounter - Plan: COMPLETE METABOLIC PANEL WITH GFR  Vitamin D deficiency -history of vitamin D deficiency.  Vitamin D was low at 55 on July 03, 2022.  She finished the course of vitamin D 50,000 units once a week in January and is stopped vitamin D after that.  She did not come back for the repeat vitamin D levels.  Will check vitamin D level today.  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  She would not be able to take Forteo or Tymlos.  Exocrine pancreatic insufficiency  TMJ (temporomandibular joint disorder)  History of hyperlipidemia  Primary hypertension-blood pressure was elevated at 159/73.  Patient was advised to monitor blood pressure closely and follow-up with her PCP.  Prediabetes  History of gastroesophageal reflux (GERD)  Orders: Orders Placed This Encounter  Procedures   COMPLETE METABOLIC PANEL WITH GFR   VITAMIN D 25 Hydroxy (Vit-D Deficiency, Fractures)   No orders of the defined types were placed in this encounter.    Follow-Up  Instructions: Return in about 6 months (around 06/06/2023) for Osteoporosis.   Bo Merino, MD  Note - This record has been created using Editor, commissioning.  Chart creation errors have been sought, but may not always  have been located. Such creation errors do not reflect  on  the standard of medical care.

## 2022-12-05 ENCOUNTER — Encounter: Payer: Self-pay | Admitting: Rheumatology

## 2022-12-05 ENCOUNTER — Ambulatory Visit: Payer: Medicare Other | Attending: Rheumatology | Admitting: Rheumatology

## 2022-12-05 VITALS — BP 159/73 | HR 55 | Resp 16 | Ht 60.0 in | Wt 133.0 lb

## 2022-12-05 DIAGNOSIS — I10 Essential (primary) hypertension: Secondary | ICD-10-CM

## 2022-12-05 DIAGNOSIS — Z8719 Personal history of other diseases of the digestive system: Secondary | ICD-10-CM

## 2022-12-05 DIAGNOSIS — Z5181 Encounter for therapeutic drug level monitoring: Secondary | ICD-10-CM

## 2022-12-05 DIAGNOSIS — C259 Malignant neoplasm of pancreas, unspecified: Secondary | ICD-10-CM | POA: Diagnosis not present

## 2022-12-05 DIAGNOSIS — R7303 Prediabetes: Secondary | ICD-10-CM | POA: Diagnosis not present

## 2022-12-05 DIAGNOSIS — M26609 Unspecified temporomandibular joint disorder, unspecified side: Secondary | ICD-10-CM

## 2022-12-05 DIAGNOSIS — K8681 Exocrine pancreatic insufficiency: Secondary | ICD-10-CM

## 2022-12-05 DIAGNOSIS — M810 Age-related osteoporosis without current pathological fracture: Secondary | ICD-10-CM

## 2022-12-05 DIAGNOSIS — E559 Vitamin D deficiency, unspecified: Secondary | ICD-10-CM | POA: Diagnosis not present

## 2022-12-05 DIAGNOSIS — Z8639 Personal history of other endocrine, nutritional and metabolic disease: Secondary | ICD-10-CM | POA: Diagnosis not present

## 2022-12-06 LAB — COMPLETE METABOLIC PANEL WITH GFR
AG Ratio: 1.3 (calc) (ref 1.0–2.5)
ALT: 14 U/L (ref 6–29)
AST: 24 U/L (ref 10–35)
Albumin: 3.9 g/dL (ref 3.6–5.1)
Alkaline phosphatase (APISO): 94 U/L (ref 37–153)
BUN/Creatinine Ratio: 18 (calc) (ref 6–22)
BUN: 18 mg/dL (ref 7–25)
CO2: 31 mmol/L (ref 20–32)
Calcium: 9.6 mg/dL (ref 8.6–10.4)
Chloride: 103 mmol/L (ref 98–110)
Creat: 1 mg/dL — ABNORMAL HIGH (ref 0.60–0.95)
Globulin: 2.9 g/dL (calc) (ref 1.9–3.7)
Glucose, Bld: 84 mg/dL (ref 65–99)
Potassium: 4.5 mmol/L (ref 3.5–5.3)
Sodium: 142 mmol/L (ref 135–146)
Total Bilirubin: 0.5 mg/dL (ref 0.2–1.2)
Total Protein: 6.8 g/dL (ref 6.1–8.1)
eGFR: 57 mL/min/{1.73_m2} — ABNORMAL LOW (ref >=60)

## 2022-12-06 LAB — VITAMIN D 25 HYDROXY (VIT D DEFICIENCY, FRACTURES): Vit D, 25-Hydroxy: 22 ng/mL — ABNORMAL LOW (ref 30–100)

## 2022-12-06 NOTE — Progress Notes (Signed)
Creatinine is elevated.  Patient should avoid NSAID use.  Vitamin D is low at 22.  Please call in vitamin D 50,000 units once a week for 3 months.  Patient should take vitamin D 2000 units daily after that.  Repeat vitamin D in 3 months.  Patient should be able to get Prolia injection.

## 2022-12-09 ENCOUNTER — Other Ambulatory Visit: Payer: Self-pay | Admitting: *Deleted

## 2022-12-09 DIAGNOSIS — E559 Vitamin D deficiency, unspecified: Secondary | ICD-10-CM

## 2022-12-09 MED ORDER — VITAMIN D (ERGOCALCIFEROL) 1.25 MG (50000 UNIT) PO CAPS: 50000.0000 [IU] | ORAL_CAPSULE | ORAL | 0 refills | Status: DC

## 2022-12-09 NOTE — Telephone Encounter (Signed)
-----   Message from Pollyann Savoy, MD sent at 12/06/2022  8:09 AM EDT ----- Creatinine is elevated.  Patient should avoid NSAID use.  Vitamin D is low at 22.  Please call in vitamin D 50,000 units once a week for 3 months.  Patient should take vitamin D 2000 units daily after that.  Repeat vitamin D in 3 months.  Patient sho uld be able to get Prolia injection.

## 2022-12-10 ENCOUNTER — Other Ambulatory Visit (HOSPITAL_COMMUNITY): Payer: Self-pay

## 2022-12-10 ENCOUNTER — Telehealth: Payer: Self-pay | Admitting: Pharmacist

## 2022-12-10 NOTE — Telephone Encounter (Signed)
Completed authorization for Chana Bode E3662 on [facility] provider portal. Prolia is approved from 12/10/22 through 12/10/23 for 2 doses.  Auth # [id number]  Patient has [facility] Medicare plan. [facility] is in-network with her plan. She has no plan deductible for the year but does have maximum out-of-pocket for $3600 for the year. As of 12/10/22, she has met $57.88 of OOP max. She will have 20% coinsurance as Medicare will pick up 80% of cost of Prolia.  Chesley Mires, PharmD, MPH, BCPS, CPP Clinical Pharmacist (Rheumatology and Pulmonology)

## 2022-12-10 NOTE — Telephone Encounter (Addendum)
Copay for Prolia is $300 through pharmacy benefit. Patient states she does not have the money to pay for the medication. She would prefer to go to the hospital to receive Prolia. Please run BIV for Prolia through medical benefit  Chesley Mires, PharmD, MPH, BCPS, CPP Clinical Pharmacist (Rheumatology and Pulmonology)  ----- Message from Pollyann Savoy, MD sent at 12/06/2022  8:09 AM EDT ----- Creatinine is elevated.  Patient should avoid NSAID use.  Vitamin D is low at 22.  Please call in vitamin D 50,000 units once a week for 3 months.  Patient should take vitamin D 2000 units daily after that.  Repeat vitamin D in 3 months.  Patient sho uld be able to get Prolia injection.

## 2022-12-11 ENCOUNTER — Other Ambulatory Visit: Payer: Self-pay | Admitting: Pharmacist

## 2022-12-11 DIAGNOSIS — M810 Age-related osteoporosis without current pathological fracture: Secondary | ICD-10-CM

## 2022-12-11 NOTE — Progress Notes (Signed)
Patient significantly overdue for Prolia. She was due on 07/21/22 Diagnosis: age-related osteoporosis  Dose: 60 mg SQ every 6 months  Last Clinic Visit: 12/05/22 Next Clinic Visit: 03/11/23  Last Prolia dose: 01/16/22  Labs: 12/05/22 - CMP and Vit D - Vitamin D low Last DEXA: 07/31/21 Next DEXA due: Nov 2024  Orders placed for Prolia x 1 dose. No premedicatons required.   ATC patient and provide with phone number for: [facility] [phone]) [facility]. LEFT VM  Will follow-up to ensured scheduled and completed

## 2022-12-11 NOTE — Telephone Encounter (Signed)
Prolia order placed w [facility]. ATC pt - left VM  Chesley Mires, PharmD, MPH, BCPS, CPP Clinical Pharmacist (Rheumatology and Pulmonology)

## 2022-12-16 NOTE — Progress Notes (Signed)
ATC #2 regarding scheduling of Prolia. Unable to reach  Chesley Mires, PharmD, MPH, BCPS, CPP Clinical Pharmacist (Rheumatology and Pulmonology)

## 2022-12-17 NOTE — Progress Notes (Signed)
Conference called patient w [facility]. She is now scheduled for Prolia at [facility] at 12/26/22. Her son will bring her to site of care  Chesley Mires, PharmD, MPH, BCPS, CPP Clinical Pharmacist (Rheumatology and Pulmonology)

## 2022-12-26 ENCOUNTER — Ambulatory Visit (HOSPITAL_COMMUNITY)
Admission: RE | Admit: 2022-12-26 | Discharge: 2022-12-26 | Disposition: A | Discharge status: Home / Self Care | Payer: Medicare Other | Source: Ambulatory Visit | Attending: Rheumatology | Admitting: Rheumatology

## 2022-12-26 DIAGNOSIS — M810 Age-related osteoporosis without current pathological fracture: Secondary | ICD-10-CM | POA: Insufficient documentation

## 2022-12-26 MED ORDER — DENOSUMAB 60 MG/ML ~~LOC~~ SOSY
PREFILLED_SYRINGE | SUBCUTANEOUS | Status: AC
  Administered 2022-12-26: 60 mg via SUBCUTANEOUS
  Filled 2022-12-26: qty 1

## 2022-12-26 MED ORDER — DENOSUMAB 60 MG/ML ~~LOC~~ SOSY: 60.0000 mg | PREFILLED_SYRINGE | Freq: Once | SUBCUTANEOUS | Status: AC

## 2023-02-25 NOTE — Progress Notes (Signed)
Office Visit Note  Patient: Sheila Hurst             Date of Birth: 03-07-1941           MRN: [id number]             PCP: Blair Heys, MD Referring: Blair Heys, MD Visit Date: 03/11/2023 Occupation: @GUAROCC @  Subjective:  Medication management  History of Present Illness: Sheila Hurst is a 82 y.o. female with osteoporosis, osteoarthritis and degenerative disc disease.  She states she has been tolerating Prolia injections without any side effects.  Her last Prolia injection was in April 2024.She has been taking calcium and vitamin D.  She had vitamin D deficiency and she finished a second course of vitamin D 50,000 units once a week for 3 months.  She has been taking vitamin D 2000 units daily now.  Consumes calcium rich diet.  She has been walking on a regular basis.  She is also joined [employer].  She states she had to viscosupplement injections to her knee joints at [facility] recently which were helpful.  She is also followed there for degenerative disc disease of lumbar spine.   Activities of Daily Living:  Patient reports morning stiffness for 0 minute.   Patient Denies nocturnal pain.  Difficulty dressing/grooming: Denies Difficulty climbing stairs: Reports Difficulty getting out of chair: Reports Difficulty using hands for taps, buttons, cutlery, and/or writing: Denies  Review of Systems  Constitutional:  Negative for fatigue.  HENT:  Positive for mouth dryness. Negative for mouth sores.   Eyes:  Negative for dryness.  Respiratory:  Negative for shortness of breath.   Cardiovascular:  Negative for chest pain and palpitations.  Gastrointestinal:  Negative for blood in stool, constipation and diarrhea.  Endocrine: Positive for increased urination.  Genitourinary:  Positive for involuntary urination.  Musculoskeletal:  Positive for joint pain, gait problem, joint pain and joint swelling. Negative for myalgias, muscle weakness, morning stiffness, muscle  tenderness and myalgias.  Skin:  Negative for color change, rash, hair loss and sensitivity to sunlight.  Allergic/Immunologic: Negative for susceptible to infections.  Neurological:  Negative for dizziness and headaches.  Hematological:  Negative for swollen glands.  Psychiatric/Behavioral:  Negative for depressed mood and sleep disturbance. The patient is not nervous/anxious.     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/2012    Past Medical History:  Diagnosis Date   Arthritis    left knee   Bronchitis    GERD (gastroesophageal reflux disease)    no medication   Hyperlipidemia    Hypertension    Pancreatic mass 09/15/2012   Head of Pancreas, malignant cells consistent w/adenocarcinoma   Radiation 10/19/12-11/30/12   50.4 gray Pancreatic Ca/abdomen   Wears dentures    top   Wears glasses    Weight loss    Prior to Dx    Family History  Problem Relation Age of Onset   Alzheimer's disease Mother    Throat cancer Father    Rheum arthritis Sister    Heart murmur Sister    Osteoarthritis Sister    Prostate cancer  Brother    Prostate cancer Brother    Stomach cancer Brother    Thyroid disease Brother    Heart murmur Brother    Alzheimer's disease Maternal Aunt    Pancreatic cancer Paternal Aunt    Alzheimer's disease Paternal Uncle    Past Surgical History:  Procedure Laterality Date   APPENDECTOMY  70's   Badder tack  09-2011   BELPHAROPTOSIS REPAIR     CARDIAC CATHETERIZATION  12/2006   CHOLECYSTECTOMY  12/2012   EUS  09/15/2012   Procedure: ESOPHAGEAL ENDOSCOPIC ULTRASOUND (EUS)  RADIAL;  Surgeon: Willis Modena, MD;  [facility];  Service: Endoscopy;  Laterality: N/A;  + or - fna unsure of what scope   FINE NEEDLE ASPIRATION  09/15/2012   Procedure: FINE NEEDLE ASPIRATION (FNA) LINEAR;  Surgeon: Willis Modena, MD;  [facility];  Service: Endoscopy;  Laterality: N/A;   LAPAROSCOPY N/A 01/28/2013   Procedure: LAPAROSCOPY DIAGNOSTIC,  WHIPPLE;  Surgeon: Almond Lint, MD;  [facility];  Service: General;  Laterality: N/A;   PANCREATICODUODENECTOMY  12/2012   PORT-A-CATH REMOVAL Right 07/14/2014   Procedure: REMOVAL PORT-A-CATH;  Surgeon: Almond Lint, MD;  Location: [facility];  Service: General;  Laterality: Right;   PORTACATH PLACEMENT N/A 03/18/2013   Procedure: INSERTION PORT-A-CATH;  Surgeon: Almond Lint, MD;  Location: [facility];  Service: General;  Laterality: N/A;  Flouro time , 11 seconds.   TONSILLECTOMY     age 75   TOTAL ABDOMINAL HYSTERECTOMY W/ BILATERAL SALPINGOOPHORECTOMY  1993   TUBAL LIGATION  1977   WHIPPLE PROCEDURE N/A 01/28/2013   Procedure: WHIPPLE PROCEDURE;  Surgeon: Almond Lint, MD;  [facility];  Service: General;  Laterality: N/A;   Social History   Social History Narrative   Widowed-lives alone   1 son , 1 daughter   Retired-customer service with [employer] History  Administered Date(s) Administered   Influenza-Unspecified 05/07/2018, 05/22/2022   Moderna SARS-COV2 Booster Vaccination 09/14/2020   Moderna Sars-Covid-2 Vaccination 10/25/2019, 11/23/2019   Zoster Recombinant(Shingrix) 05/22/2022     Objective: Vital Signs: BP 136/72 (BP Location: Left Arm, Patient Position: Sitting, Cuff Size: Normal)   Pulse (!) 52   Resp 12   Ht 5' (1.524 m)   Wt 124 lb (56.2 kg)   BMI 24.22 kg/m    Physical Exam Vitals and nursing note reviewed.  Constitutional:      Appearance: She is well-developed.  HENT:     Head: Normocephalic and atraumatic.   Eyes:     Conjunctiva/sclera: Conjunctivae normal.  Cardiovascular:     Rate and Rhythm: Normal rate and regular rhythm.     Heart sounds: Normal heart sounds.  Pulmonary:     Effort: Pulmonary effort is normal.     Breath sounds: Normal breath sounds.  Abdominal:     General: Bowel sounds are normal.     Palpations: Abdomen is soft.  Musculoskeletal:     Cervical back: Normal range of motion.  Lymphadenopathy:     Cervical: No cervical adenopathy.  Skin:    General: Skin is warm and dry.     Capillary Refill: Capillary refill takes less than 2 seconds.  Neurological:     Mental Status: She is alert and oriented to person, place, and time.  Psychiatric:        Behavior: Behavior normal.      Musculoskeletal Exam: Cervical spine was in good range of motion.  Mild thoracic kyphosis  was noted.  There was no tenderness over thoracic or lumbar spine.  Shoulders, elbows, wrist joints, MCPs PIPs and DIPs were in good range of motion.  Bilateral PIP and DIP thickening was noted.  Hip joints were in good range of motion.  She had limited extension of her left knee joint with valgus deformity.  There was no effusion.  There was no tenderness over ankles or MTPs.  CDAI Exam: CDAI Score: -- Patient Global: --; Provider Global: -- Swollen: --; Tender: -- Joint Exam 03/11/2023   No joint exam has been documented for this visit   There is currently no information documented on the homunculus. Go to the Rheumatology activity and complete the homunculus joint exam.  Investigation: No additional findings.  Imaging: No results found.  Recent Labs: Lab Results  Component Value Date   WBC 3.6 (L) 07/03/2022   HGB 11.9 07/03/2022   PLT 228 07/03/2022   NA 142 12/05/2022   K 4.5 12/05/2022   CL 103 12/05/2022   CO2 31 12/05/2022   GLUCOSE 84 12/05/2022   BUN 18 12/05/2022   CREATININE 1.00 (H) 12/05/2022   BILITOT 0.5 12/05/2022   ALKPHOS 129 12/31/2016   AST 24 12/05/2022   ALT  14 12/05/2022   PROT 6.8 12/05/2022   ALBUMIN 4.0 12/31/2016   CALCIUM 9.6 12/05/2022   GFRAA 75 (L) 10/10/2013    Speciality Comments: History of radiation therapy-cannot take Tymlos or Forteo.  Procedures:  No procedures performed Allergies: Lisinopril, Metoprolol, and Ondansetron   Assessment / Plan:     Visit Diagnoses: Age-related osteoporosis without current pathological fracture - DEXA 07/31/21: BMD forearm 0.514 with T-score - 4.2.  T score left femur total -3.2, BMD 0.602, 06/2019 T score -2.9 and BMD 0.637.  Prolia was started in May 2023.  Patient did not come for the next Prolia injection until April 2024.  She has been taking calcium and vitamin D.  We had a detailed discussion regarding increased risk of vertebral fractures and other fractures by delaying Prolia injections.  Patient voiced understanding.  Her next Prolia injection will be due in October.  Medication monitoring encounter - first Prolia injection on Jan 22, 2022. She did not return for the second Prolia injection. last prolia injection: 12/26/2022.  Vitamin D deficiency -vitamin D was 22 on December 05, 2022.  She was given another course of vitamin D 50,000 units weekly.  She is currently taking vitamin D 2000 units daily.  Will check levels today.  Plan: VITAMIN D 25 Hydroxy (Vit-D Deficiency, Fractures)  Primary osteoarthritis of both knees-patient continues to have pain and discomfort in her knee joints.  She had valgus deformity in her left knee joint.  She has been followed at [facility] now and has been getting viscosupplement injections.  DDD (degenerative disc disease), lumbar-followed at [facility].  TMJ (temporomandibular joint disorder)  Pancreatic adenocarcinoma ([facility]) - 2014 treated with surgery, chemotherapy and radiation therapy.  She would not be able to take Forteo or Tymlos.  Exocrine pancreatic insufficiency  Primary hypertension-blood pressure was normal at 136/72.  History of  hyperlipidemia  Prediabetes  History of gastroesophageal reflux (GERD)  Orders: Orders Placed This Encounter  Procedures   VITAMIN D 25 Hydroxy (Vit-D Deficiency, Fractures)   No orders of the defined types were placed in this encounter.    Follow-Up Instructions: Return in about 3 months (around 06/11/2023) for Osteoporosis.   Pollyann Savoy, MD  Note - This record has been created using Animal nutritionist.  Chart creation errors have been sought, but may not always  have been located. Such creation errors do not reflect on  the standard of medical care.

## 2023-03-06 ENCOUNTER — Other Ambulatory Visit: Payer: Self-pay | Admitting: Physician Assistant

## 2023-03-06 DIAGNOSIS — E559 Vitamin D deficiency, unspecified: Secondary | ICD-10-CM

## 2023-03-11 ENCOUNTER — Encounter: Payer: Self-pay | Admitting: Rheumatology

## 2023-03-11 ENCOUNTER — Ambulatory Visit: Payer: Medicare Other | Attending: Rheumatology | Admitting: Rheumatology

## 2023-03-11 VITALS — BP 136/72 | HR 52 | Resp 12 | Ht 60.0 in | Wt 124.0 lb

## 2023-03-11 DIAGNOSIS — R7303 Prediabetes: Secondary | ICD-10-CM | POA: Diagnosis not present

## 2023-03-11 DIAGNOSIS — Z8719 Personal history of other diseases of the digestive system: Secondary | ICD-10-CM | POA: Diagnosis not present

## 2023-03-11 DIAGNOSIS — C259 Malignant neoplasm of pancreas, unspecified: Secondary | ICD-10-CM | POA: Diagnosis not present

## 2023-03-11 DIAGNOSIS — I10 Essential (primary) hypertension: Secondary | ICD-10-CM

## 2023-03-11 DIAGNOSIS — M810 Age-related osteoporosis without current pathological fracture: Secondary | ICD-10-CM

## 2023-03-11 DIAGNOSIS — M51369 Other intervertebral disc degeneration, lumbar region without mention of lumbar back pain or lower extremity pain: Secondary | ICD-10-CM

## 2023-03-11 DIAGNOSIS — M5136 Other intervertebral disc degeneration, lumbar region: Secondary | ICD-10-CM

## 2023-03-11 DIAGNOSIS — Z8639 Personal history of other endocrine, nutritional and metabolic disease: Secondary | ICD-10-CM | POA: Diagnosis not present

## 2023-03-11 DIAGNOSIS — Z5181 Encounter for therapeutic drug level monitoring: Secondary | ICD-10-CM | POA: Diagnosis not present

## 2023-03-11 DIAGNOSIS — M170 Bilateral primary osteoarthritis of knee: Secondary | ICD-10-CM

## 2023-03-11 DIAGNOSIS — E559 Vitamin D deficiency, unspecified: Secondary | ICD-10-CM

## 2023-03-11 DIAGNOSIS — M26609 Unspecified temporomandibular joint disorder, unspecified side: Secondary | ICD-10-CM | POA: Diagnosis not present

## 2023-03-11 DIAGNOSIS — K8681 Exocrine pancreatic insufficiency: Secondary | ICD-10-CM

## 2023-03-12 ENCOUNTER — Telehealth: Payer: Self-pay | Admitting: *Deleted

## 2023-03-12 DIAGNOSIS — E559 Vitamin D deficiency, unspecified: Secondary | ICD-10-CM

## 2023-03-12 DIAGNOSIS — M810 Age-related osteoporosis without current pathological fracture: Secondary | ICD-10-CM

## 2023-03-12 LAB — VITAMIN D 25 HYDROXY (VIT D DEFICIENCY, FRACTURES): Vit D, 25-Hydroxy: 29 ng/mL — ABNORMAL LOW (ref 30–100)

## 2023-03-12 NOTE — Telephone Encounter (Signed)
-----   Message from Pollyann Savoy, MD sent at 03/12/2023  8:18 AM EDT ----- Vitamin D is 29 which is low normal.  Patient should continue to take vitamin D 2000 units daily.  We will recheck vitamin D in 6 months.

## 2023-03-12 NOTE — Progress Notes (Signed)
Vitamin D is 53 which is low normal.  Patient should continue to take vitamin D 2000 units daily.  We will recheck vitamin D in 6 months.

## 2023-05-15 DIAGNOSIS — R413 Other amnesia: Secondary | ICD-10-CM | POA: Diagnosis not present

## 2023-05-15 DIAGNOSIS — G3184 Mild cognitive impairment, so stated: Secondary | ICD-10-CM | POA: Diagnosis not present

## 2023-05-15 DIAGNOSIS — H9042 Sensorineural hearing loss, unilateral, left ear, with unrestricted hearing on the contralateral side: Secondary | ICD-10-CM | POA: Diagnosis not present

## 2023-05-28 DIAGNOSIS — Z8507 Personal history of malignant neoplasm of pancreas: Secondary | ICD-10-CM | POA: Diagnosis not present

## 2023-05-28 DIAGNOSIS — R7303 Prediabetes: Secondary | ICD-10-CM | POA: Diagnosis not present

## 2023-05-28 DIAGNOSIS — I10 Essential (primary) hypertension: Secondary | ICD-10-CM | POA: Diagnosis not present

## 2023-05-28 DIAGNOSIS — Z8711 Personal history of peptic ulcer disease: Secondary | ICD-10-CM | POA: Diagnosis not present

## 2023-05-28 DIAGNOSIS — Z23 Encounter for immunization: Secondary | ICD-10-CM | POA: Diagnosis not present

## 2023-05-28 DIAGNOSIS — M1990 Unspecified osteoarthritis, unspecified site: Secondary | ICD-10-CM | POA: Diagnosis not present

## 2023-05-28 DIAGNOSIS — E7800 Pure hypercholesterolemia, unspecified: Secondary | ICD-10-CM | POA: Diagnosis not present

## 2023-05-28 DIAGNOSIS — M810 Age-related osteoporosis without current pathological fracture: Secondary | ICD-10-CM | POA: Diagnosis not present

## 2023-05-28 DIAGNOSIS — Z0000 Encounter for general adult medical examination without abnormal findings: Secondary | ICD-10-CM | POA: Diagnosis not present

## 2023-05-28 DIAGNOSIS — I700 Atherosclerosis of aorta: Secondary | ICD-10-CM | POA: Diagnosis not present

## 2023-05-28 DIAGNOSIS — K5900 Constipation, unspecified: Secondary | ICD-10-CM | POA: Diagnosis not present

## 2023-05-29 ENCOUNTER — Other Ambulatory Visit: Payer: Self-pay | Admitting: Family Medicine

## 2023-05-29 DIAGNOSIS — Z1231 Encounter for screening mammogram for malignant neoplasm of breast: Secondary | ICD-10-CM

## 2023-05-30 ENCOUNTER — Inpatient Hospital Stay: Payer: Medicare Other | Admitting: Oncology

## 2023-06-09 ENCOUNTER — Inpatient Hospital Stay: Payer: Medicare Other | Attending: Oncology | Admitting: Oncology

## 2023-06-09 VITALS — BP 163/92 | HR 58 | Temp 98.1°F | Resp 18 | Ht 60.0 in | Wt 126.2 lb

## 2023-06-09 DIAGNOSIS — C250 Malignant neoplasm of head of pancreas: Secondary | ICD-10-CM | POA: Diagnosis not present

## 2023-06-09 DIAGNOSIS — I10 Essential (primary) hypertension: Secondary | ICD-10-CM | POA: Diagnosis not present

## 2023-06-09 DIAGNOSIS — Z800 Family history of malignant neoplasm of digestive organs: Secondary | ICD-10-CM | POA: Insufficient documentation

## 2023-06-09 DIAGNOSIS — C257 Malignant neoplasm of other parts of pancreas: Secondary | ICD-10-CM | POA: Insufficient documentation

## 2023-06-09 DIAGNOSIS — Z923 Personal history of irradiation: Secondary | ICD-10-CM | POA: Insufficient documentation

## 2023-06-09 DIAGNOSIS — K439 Ventral hernia without obstruction or gangrene: Secondary | ICD-10-CM | POA: Diagnosis not present

## 2023-06-09 DIAGNOSIS — Z79899 Other long term (current) drug therapy: Secondary | ICD-10-CM | POA: Diagnosis not present

## 2023-06-09 DIAGNOSIS — E785 Hyperlipidemia, unspecified: Secondary | ICD-10-CM | POA: Diagnosis not present

## 2023-06-09 DIAGNOSIS — K7689 Other specified diseases of liver: Secondary | ICD-10-CM | POA: Diagnosis not present

## 2023-06-09 NOTE — Progress Notes (Signed)
  [facility] OFFICE PROGRESS NOTE   Diagnosis: Pancreas cancer  INTERVAL HISTORY:   Sheila Hurst returns as scheduled.  She feels well.  She is exercising.  Good appetite.  No pain.  Objective:  Vital signs in last 24 hours:  Blood pressure (!) 163/92, pulse (!) 58, temperature 98.1 F (36.7 C), temperature source Temporal, resp. rate 18, height 5' (1.524 m), weight 126 lb 3.2 oz (57.2 kg), SpO2 95%.    Lymphatics: No cervical, supraclavicular, axillary, or inguinal nodes Resp: Lungs clear bilaterally Cardio: Regular rate and rhythm GI: Nondistended, no hepatosplenomegaly, no apparent ascites, soft fullness in the left greater than right mid abdomen-musculature? Vascular: No leg edema   Lab Results:  Lab Results  Component Value Date   WBC 3.6 (L) 07/03/2022   HGB 11.9 07/03/2022   HCT 35.8 07/03/2022   MCV 87.5 07/03/2022   PLT 228 07/03/2022   NEUTROABS 1,584 07/03/2022    CMP  Lab Results  Component Value Date   NA 142 12/05/2022   K 4.5 12/05/2022   CL 103 12/05/2022   CO2 31 12/05/2022   GLUCOSE 84 12/05/2022   BUN 18 12/05/2022   CREATININE 1.00 (H) 12/05/2022   CALCIUM 9.6 12/05/2022   PROT 6.8 12/05/2022   ALBUMIN 4.0 12/31/2016   AST 24 12/05/2022   ALT 14 12/05/2022   ALKPHOS 129 12/31/2016   BILITOT 0.5 12/05/2022   GFRNONAA 65 (L) 10/10/2013   GFRAA 75 (L) 10/10/2013    Lab Results  Component Value Date   ZOX096 32 12/17/2019      Medications: I have reviewed the patient's current medications.   Assessment/Plan: Adenocarcinoma of the pancreas; mass in the neck of the pancreas, status post EUS guided biopsy 09/15/2012 with the cytology confirming malignant cells consistent with adenocarcinoma, T3 N0 by ultrasound. 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 Elevated CA 19-9. She began radiation and concurrent Xeloda chemotherapy on 10/19/2012, radiation  completed 11/30/2012 -Whipple procedure 01/28/2013 with no residual carcinoma seen and stromal fibrosis consistent with post treatment effect.   -She began adjuvant gemcitabine 04/01/2013, completed 07/22/2013.  - CT abdomen/pelvis 01/01/2016 showed no evidence of local recurrence or metastatic disease. There was interval increase in the size of a ventral abdominal wall hernia. -CT abdomen/pelvis 12/07/2021-ventral hernia History of Pain secondary to pancreas cancer Right liver cyst. Smaller on MRI 09/28/2012 compared to 11/03/2008. Hypertension  Hyperlipidemia. Family history of pancreas cancer (paternal aunt). Anorexia/weight loss. Resolved. Bradycardia when here on 11/11/2012. Atenolol was placed on hold. Right leg DVT documented on a Doppler 11/26/2012-no longer on anticoagulation. History of Hypokalemia. She is still taking a potassium supplement. CT abdomen/pelvis 10/10/2013 done to evaluate abdominal pain. No acute abnormality identified in the abdomen and pelvis. Postsurgical changes at the pancreas. Stable liver cyst. Port-A-Cath removal 07/14/2014       Disposition: Sheila Hurst remains in clinical remission from pancreas cancer.  She would like to continue follow-up at the Cancer center.  Sheila Hurst will return for an office visit in 1 year.  Thornton Papas, MD  06/09/2023  9:33 AM

## 2023-06-25 DIAGNOSIS — R413 Other amnesia: Secondary | ICD-10-CM | POA: Diagnosis not present

## 2023-07-03 ENCOUNTER — Telehealth: Payer: Self-pay | Admitting: Pharmacist

## 2023-07-03 ENCOUNTER — Other Ambulatory Visit (HOSPITAL_COMMUNITY): Payer: Self-pay

## 2023-07-03 DIAGNOSIS — E559 Vitamin D deficiency, unspecified: Secondary | ICD-10-CM

## 2023-07-03 DIAGNOSIS — Z5181 Encounter for therapeutic drug level monitoring: Secondary | ICD-10-CM

## 2023-07-03 DIAGNOSIS — M810 Age-related osteoporosis without current pathological fracture: Secondary | ICD-10-CM

## 2023-07-03 NOTE — Telephone Encounter (Signed)
Patient overdue for Prolia. Was due on 06/24/2023. Will need updated labs.  Per test claim, copay is $300  Chesley Mires, PharmD, MPH, BCPS, CPP Clinical Pharmacist (Rheumatology and Pulmonology)

## 2023-08-21 DIAGNOSIS — M1712 Unilateral primary osteoarthritis, left knee: Secondary | ICD-10-CM | POA: Diagnosis not present

## 2023-08-23 DIAGNOSIS — R051 Acute cough: Secondary | ICD-10-CM | POA: Diagnosis not present

## 2023-08-23 DIAGNOSIS — R0781 Pleurodynia: Secondary | ICD-10-CM | POA: Diagnosis not present

## 2023-08-29 NOTE — Progress Notes (Deleted)
 Office Visit Note  Patient: Sheila Hurst             Date of Birth: 07-05-1941           MRN: [id number]             PCP: Blair Heys, MD (Inactive) Referring: Blair Heys, MD Visit Date: 09/12/2023 Occupation: @GUAROCC @  Subjective:  No chief complaint on file.   History of Present Illness: Sheila Hurst is a 82 y.o. female ***     Activities of Daily Living:  Patient reports morning stiffness for *** {minute/hour:19697}.   Patient {ACTIONS;DENIES/REPORTS:21021675::"Denies"} nocturnal pain.  Difficulty dressing/grooming: {ACTIONS;DENIES/REPORTS:21021675::"Denies"} Difficulty climbing stairs: {ACTIONS;DENIES/REPORTS:21021675::"Denies"} Difficulty getting out of chair: {ACTIONS;DENIES/REPORTS:21021675::"Denies"} Difficulty using hands for taps, buttons, cutlery, and/or writing: {ACTIONS;DENIES/REPORTS:21021675::"Denies"}  No Rheumatology ROS completed.   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/2012    Past Medical History:  Diagnosis Date   Arthritis    left knee   Bronchitis    GERD (gastroesophageal reflux disease)    no medication   Hyperlipidemia    Hypertension    Pancreatic mass 09/15/2012   Head of Pancreas, malignant cells consistent w/adenocarcinoma   Radiation 10/19/12-11/30/12   50.4 gray Pancreatic Ca/abdomen   Wears dentures    top   Wears glasses    Weight loss    Prior to Dx    Family History  Problem Relation Age of Onset   Alzheimer's disease Mother    Throat cancer Father    Rheum arthritis Sister    Heart murmur Sister     Osteoarthritis Sister    Prostate cancer Brother    Prostate cancer Brother    Stomach cancer Brother    Thyroid disease Brother    Heart murmur Brother    Alzheimer's disease Maternal Aunt    Pancreatic cancer Paternal Aunt    Alzheimer's disease Paternal Uncle    Past Surgical History:  Procedure Laterality Date   APPENDECTOMY  70's   Badder tack  09-2011   BELPHAROPTOSIS REPAIR     CARDIAC CATHETERIZATION  12/2006   CHOLECYSTECTOMY  12/2012   EUS  09/15/2012   Procedure: ESOPHAGEAL ENDOSCOPIC ULTRASOUND (EUS) RADIAL;  Surgeon: Willis Modena, MD;  Location: [facility];  Service: Endoscopy;  Laterality: N/A;  + or - fna unsure of what scope   FINE NEEDLE ASPIRATION  09/15/2012   Procedure: FINE NEEDLE ASPIRATION (FNA) LINEAR;  Surgeon: Willis Modena, MD;  Location: [facility];  Service: Endoscopy;  Laterality: N/A;   LAPAROSCOPY N/A 01/28/2013   Procedure: LAPAROSCOPY DIAGNOSTIC,  WHIPPLE;  Surgeon: Almond Lint, MD;  Location: [facility];  Service: General;  Laterality: N/A;   PANCREATICODUODENECTOMY  12/2012   PORT-A-CATH REMOVAL Right 07/14/2014   Procedure: REMOVAL PORT-A-CATH;  Surgeon: Almond Lint, MD;  Location: [facility];  Service: General;  Laterality: Right;   PORTACATH PLACEMENT N/A 03/18/2013   Procedure: INSERTION PORT-A-CATH;  Surgeon: Almond Lint, MD;  Location:  SURGERY CENTER;  Service: General;  Laterality: N/A;  Flouro time , 11 seconds.   TONSILLECTOMY     age 51  TOTAL ABDOMINAL HYSTERECTOMY W/ BILATERAL SALPINGOOPHORECTOMY  1993   TUBAL LIGATION  1977   WHIPPLE PROCEDURE N/A 01/28/2013   Procedure: WHIPPLE PROCEDURE;  Surgeon: Almond Lint, MD;  Location: [facility];  Service: General;  Laterality: N/A;   Social History   Social History Narrative   Widowed-lives alone   1 son , 1 daughter   Retired-customer service with [employer] History  Administered Date(s) Administered   Influenza, High Dose Seasonal  PF 05/28/2023   Influenza-Unspecified 05/07/2018, 05/22/2022   Moderna SARS-COV2 Booster Vaccination 09/14/2020   Moderna Sars-Covid-2 Vaccination 10/25/2019, 11/23/2019   Zoster Recombinant(Shingrix) 05/22/2022   Zoster, Live 05/28/2023     Objective: Vital Signs: There were no vitals taken for this visit.   Physical Exam   Musculoskeletal Exam: ***  CDAI Exam: CDAI Score: -- Patient Global: --; Provider Global: -- Swollen: --; Tender: -- Joint Exam 09/12/2023   No joint exam has been documented for this visit   There is currently no information documented on the homunculus. Go to the Rheumatology activity and complete the homunculus joint exam.  Investigation: No additional findings.  Imaging: No results found.  Recent Labs: Lab Results  Component Value Date   WBC 3.6 (L) 07/03/2022   HGB 11.9 07/03/2022   PLT 228 07/03/2022   NA 142 12/05/2022   K 4.5 12/05/2022   CL 103 12/05/2022   CO2 31 12/05/2022   GLUCOSE 84 12/05/2022   BUN 18 12/05/2022   CREATININE 1.00 (H) 12/05/2022   BILITOT 0.5 12/05/2022   ALKPHOS 129 12/31/2016   AST 24 12/05/2022   ALT 14 12/05/2022   PROT 6.8 12/05/2022   ALBUMIN 4.0 12/31/2016   CALCIUM 9.6 12/05/2022   GFRAA 75 (L) 10/10/2013    Speciality Comments: History of radiation therapy-cannot take Tymlos or Forteo.  Procedures:  No procedures performed Allergies: Lisinopril, Metoprolol, Solifenacin, and Ondansetron   Assessment / Plan:     Visit Diagnoses: Age-related osteoporosis without current pathological fracture  Medication monitoring encounter  Vitamin D deficiency  Primary osteoarthritis of both knees  Degeneration of intervertebral disc of lumbar region without discogenic back pain or lower extremity pain  TMJ (temporomandibular joint disorder)  Pancreatic adenocarcinoma (HCC)  Exocrine pancreatic insufficiency  Primary hypertension  History of hyperlipidemia  Prediabetes  History of  gastroesophageal reflux (GERD)  Other fatigue  Orders: No orders of the defined types were placed in this encounter.  No orders of the defined types were placed in this encounter.   Face-to-face time spent with patient was *** minutes. Greater than 50% of time was spent in counseling and coordination of care.  Follow-Up Instructions: No follow-ups on file.   Gearldine Bienenstock, PA-C  Note - This record has been created using Dragon software.  Chart creation errors have been sought, but may not always  have been located. Such creation errors do not reflect on  the standard of medical care.

## 2023-09-12 ENCOUNTER — Ambulatory Visit: Payer: Medicare Other | Admitting: Rheumatology

## 2023-09-12 DIAGNOSIS — Z5181 Encounter for therapeutic drug level monitoring: Secondary | ICD-10-CM

## 2023-09-12 DIAGNOSIS — M51369 Other intervertebral disc degeneration, lumbar region without mention of lumbar back pain or lower extremity pain: Secondary | ICD-10-CM

## 2023-09-12 DIAGNOSIS — M170 Bilateral primary osteoarthritis of knee: Secondary | ICD-10-CM

## 2023-09-12 DIAGNOSIS — Z8719 Personal history of other diseases of the digestive system: Secondary | ICD-10-CM

## 2023-09-12 DIAGNOSIS — Z8639 Personal history of other endocrine, nutritional and metabolic disease: Secondary | ICD-10-CM

## 2023-09-12 DIAGNOSIS — R7303 Prediabetes: Secondary | ICD-10-CM

## 2023-09-12 DIAGNOSIS — R5383 Other fatigue: Secondary | ICD-10-CM

## 2023-09-12 DIAGNOSIS — K8681 Exocrine pancreatic insufficiency: Secondary | ICD-10-CM

## 2023-09-12 DIAGNOSIS — I10 Essential (primary) hypertension: Secondary | ICD-10-CM

## 2023-09-12 DIAGNOSIS — M26609 Unspecified temporomandibular joint disorder, unspecified side: Secondary | ICD-10-CM

## 2023-09-12 DIAGNOSIS — E559 Vitamin D deficiency, unspecified: Secondary | ICD-10-CM

## 2023-09-12 DIAGNOSIS — C259 Malignant neoplasm of pancreas, unspecified: Secondary | ICD-10-CM

## 2023-09-12 DIAGNOSIS — M810 Age-related osteoporosis without current pathological fracture: Secondary | ICD-10-CM

## 2023-10-02 ENCOUNTER — Ambulatory Visit
Admission: RE | Admit: 2023-10-02 | Discharge: 2023-10-02 | Disposition: A | Discharge status: Home / Self Care | Payer: Medicare Other | Source: Ambulatory Visit | Attending: Family Medicine | Admitting: Family Medicine

## 2023-10-02 DIAGNOSIS — Z1231 Encounter for screening mammogram for malignant neoplasm of breast: Secondary | ICD-10-CM

## 2023-10-06 NOTE — Progress Notes (Deleted)
 Office Visit Note  Patient: Sheila Hurst             Date of Birth: 1941/08/02           MRN: [id number]             PCP: Debroah Loop, DO Referring: No ref. provider found Visit Date: 10/20/2023 Occupation: @GUAROCC @  Subjective:  No chief complaint on file.   History of Present Illness: Sheila Hurst is a 83 y.o. female ***     Activities of Daily Living:  Patient reports morning stiffness for *** {minute/hour:19697}.   Patient {ACTIONS;DENIES/REPORTS:21021675::"Denies"} nocturnal pain.  Difficulty dressing/grooming: {ACTIONS;DENIES/REPORTS:21021675::"Denies"} Difficulty climbing stairs: {ACTIONS;DENIES/REPORTS:21021675::"Denies"} Difficulty getting out of chair: {ACTIONS;DENIES/REPORTS:21021675::"Denies"} Difficulty using hands for taps, buttons, cutlery, and/or writing: {ACTIONS;DENIES/REPORTS:21021675::"Denies"}  No Rheumatology ROS completed.   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/2012    Past Medical History:  Diagnosis Date   Arthritis    left knee   Bronchitis    GERD (gastroesophageal reflux disease)    no medication   Hyperlipidemia    Hypertension    Pancreatic mass 09/15/2012   Head of Pancreas, malignant cells consistent w/adenocarcinoma   Radiation 10/19/12-11/30/12   50.4 gray Pancreatic Ca/abdomen   Wears dentures    top   Wears glasses    Weight loss    Prior to Dx    Family History  Problem Relation Age of Onset   Alzheimer's disease Mother    Throat cancer Father    Rheum arthritis Sister    Heart murmur Sister    Osteoarthritis  Sister    Prostate cancer Brother    Prostate cancer Brother    Stomach cancer Brother    Thyroid disease Brother    Heart murmur Brother    Alzheimer's disease Maternal Aunt    Pancreatic cancer Paternal Aunt    Alzheimer's disease Paternal Uncle    Past Surgical History:  Procedure Laterality Date   APPENDECTOMY  70's   Badder tack  09-2011   BELPHAROPTOSIS REPAIR     CARDIAC CATHETERIZATION  12/2006   CHOLECYSTECTOMY  12/2012   EUS  09/15/2012   Procedure: ESOPHAGEAL ENDOSCOPIC ULTRASOUND (EUS) RADIAL;  Surgeon: Willis Modena, MD;  Location: [facility];  Service: Endoscopy;  Laterality: N/A;  + or - fna unsure of what scope   FINE NEEDLE ASPIRATION  09/15/2012   Procedure: FINE NEEDLE ASPIRATION (FNA) LINEAR;  Surgeon: Willis Modena, MD;  Location: [facility];  Service: Endoscopy;  Laterality: N/A;   LAPAROSCOPY N/A 01/28/2013   Procedure: LAPAROSCOPY DIAGNOSTIC,  WHIPPLE;  Surgeon: Almond Lint, MD;  Location: [facility];  Service: General;  Laterality: N/A;   PANCREATICODUODENECTOMY  12/2012   PORT-A-CATH REMOVAL Right 07/14/2014   Procedure: REMOVAL PORT-A-CATH;  Surgeon: Almond Lint, MD;  Location: [facility];  Service: General;  Laterality: Right;   PORTACATH PLACEMENT N/A 03/18/2013   Procedure: INSERTION PORT-A-CATH;  Surgeon: Almond Lint, MD;  Location: [facility];  Service: General;  Laterality: N/A;  Flouro time , 11 seconds.   TONSILLECTOMY     age 66  TOTAL ABDOMINAL HYSTERECTOMY W/ BILATERAL SALPINGOOPHORECTOMY  1993   TUBAL LIGATION  1977   WHIPPLE PROCEDURE N/A 01/28/2013   Procedure: WHIPPLE PROCEDURE;  Surgeon: Almond Lint, MD;  Location: [facility];  Service: General;  Laterality: N/A;   Social History   Social History Narrative   Widowed-lives alone   1 son , 1 daughter   Retired-customer service with [employer] History  Administered Date(s) Administered   Influenza, High Dose Seasonal PF 05/28/2023    Influenza-Unspecified 05/07/2018, 05/22/2022   Moderna SARS-COV2 Booster Vaccination 09/14/2020   Moderna Sars-Covid-2 Vaccination 10/25/2019, 11/23/2019   Zoster Recombinant(Shingrix) 05/22/2022   Zoster, Live 05/28/2023     Objective: Vital Signs: There were no vitals taken for this visit.   Physical Exam   Musculoskeletal Exam: ***  CDAI Exam: CDAI Score: -- Patient Global: --; Provider Global: -- Swollen: --; Tender: -- Joint Exam 10/20/2023   No joint exam has been documented for this visit   There is currently no information documented on the homunculus. Go to the Rheumatology activity and complete the homunculus joint exam.  Investigation: No additional findings.  Imaging: MM 3D SCREENING MAMMOGRAM BILATERAL BREAST Result Date: 10/04/2023 CLINICAL DATA:  Screening. EXAM: DIGITAL SCREENING BILATERAL MAMMOGRAM WITH TOMOSYNTHESIS AND CAD TECHNIQUE: Bilateral screening digital craniocaudal and mediolateral oblique mammograms were obtained. Bilateral screening digital breast tomosynthesis was performed. The images were evaluated with computer-aided detection. COMPARISON:  Previous exam(s). ACR Breast Density Category b: There are scattered areas of fibroglandular density. FINDINGS: There are no findings suspicious for malignancy. IMPRESSION: No mammographic evidence of malignancy. A result letter of this screening mammogram will be mailed directly to the patient. RECOMMENDATION: Screening mammogram in one year. (Code:SM-B-01Y) BI-RADS CATEGORY  1: Negative. Electronically Signed   By: Frederico Hamman M.D.   On: 10/04/2023 10:08    Recent Labs: Lab Results  Component Value Date   WBC 3.6 (L) 07/03/2022   HGB 11.9 07/03/2022   PLT 228 07/03/2022   NA 142 12/05/2022   K 4.5 12/05/2022   CL 103 12/05/2022   CO2 31 12/05/2022   GLUCOSE 84 12/05/2022   BUN 18 12/05/2022   CREATININE 1.00 (H) 12/05/2022   BILITOT 0.5 12/05/2022   ALKPHOS 129 12/31/2016   AST 24  12/05/2022   ALT 14 12/05/2022   PROT 6.8 12/05/2022   ALBUMIN 4.0 12/31/2016   CALCIUM 9.6 12/05/2022   GFRAA 75 (L) 10/10/2013    Speciality Comments: History of radiation therapy-cannot take Tymlos or Forteo.  Procedures:  No procedures performed Allergies: Lisinopril, Metoprolol, Solifenacin, and Ondansetron   Assessment / Plan:     Visit Diagnoses: No diagnosis found.  Orders: No orders of the defined types were placed in this encounter.  No orders of the defined types were placed in this encounter.   Face-to-face time spent with patient was *** minutes. Greater than 50% of time was spent in counseling and coordination of care.  Follow-Up Instructions: No follow-ups on file.   Pollyann Savoy, MD  Note - This record has been created using Animal nutritionist.  Chart creation errors have been sought, but may not always  have been located. Such creation errors do not reflect on  the standard of medical care.

## 2023-10-19 ENCOUNTER — Emergency Department (HOSPITAL_BASED_OUTPATIENT_CLINIC_OR_DEPARTMENT_OTHER): Payer: Medicare Other

## 2023-10-19 ENCOUNTER — Emergency Department (HOSPITAL_BASED_OUTPATIENT_CLINIC_OR_DEPARTMENT_OTHER): Payer: Medicare Other | Admitting: Radiology

## 2023-10-19 ENCOUNTER — Emergency Department (HOSPITAL_BASED_OUTPATIENT_CLINIC_OR_DEPARTMENT_OTHER)
Admission: EM | Admit: 2023-10-19 | Discharge: 2023-10-19 | Disposition: A | Discharge status: Home / Self Care | Payer: Medicare Other | Attending: Emergency Medicine | Admitting: Emergency Medicine

## 2023-10-19 ENCOUNTER — Encounter (HOSPITAL_BASED_OUTPATIENT_CLINIC_OR_DEPARTMENT_OTHER): Payer: Self-pay | Admitting: Radiology

## 2023-10-19 ENCOUNTER — Other Ambulatory Visit: Payer: Self-pay

## 2023-10-19 DIAGNOSIS — R509 Fever, unspecified: Secondary | ICD-10-CM | POA: Insufficient documentation

## 2023-10-19 DIAGNOSIS — R112 Nausea with vomiting, unspecified: Secondary | ICD-10-CM | POA: Insufficient documentation

## 2023-10-19 DIAGNOSIS — I10 Essential (primary) hypertension: Secondary | ICD-10-CM | POA: Insufficient documentation

## 2023-10-19 DIAGNOSIS — Z8507 Personal history of malignant neoplasm of pancreas: Secondary | ICD-10-CM | POA: Insufficient documentation

## 2023-10-19 DIAGNOSIS — R1084 Generalized abdominal pain: Secondary | ICD-10-CM | POA: Diagnosis not present

## 2023-10-19 DIAGNOSIS — Z87891 Personal history of nicotine dependence: Secondary | ICD-10-CM | POA: Diagnosis not present

## 2023-10-19 LAB — COMPREHENSIVE METABOLIC PANEL
ALT: 15 U/L (ref 0–44)
AST: 28 U/L (ref 15–41)
Albumin: 3.9 g/dL (ref 3.5–5.0)
Alkaline Phosphatase: 88 U/L (ref 38–126)
Anion gap: 9 (ref 5–15)
BUN: 15 mg/dL (ref 8–23)
CO2: 28 mmol/L (ref 22–32)
Calcium: 9.5 mg/dL (ref 8.9–10.3)
Chloride: 99 mmol/L (ref 98–111)
Creatinine, Ser: 0.85 mg/dL (ref 0.44–1.00)
GFR, Estimated: >60 mL/min (ref >60)
Glucose, Bld: 126 mg/dL — ABNORMAL HIGH (ref 70–99)
Potassium: 3.6 mmol/L (ref 3.5–5.1)
Sodium: 136 mmol/L (ref 135–145)
Total Bilirubin: 1 mg/dL (ref 0.0–1.2)
Total Protein: 7.1 g/dL (ref 6.5–8.1)

## 2023-10-19 LAB — CBC
HCT: 35.4 % — ABNORMAL LOW (ref 36.0–46.0)
Hemoglobin: 11.6 g/dL — ABNORMAL LOW (ref 12.0–15.0)
MCH: 29.1 pg (ref 26.0–34.0)
MCHC: 32.8 g/dL (ref 30.0–36.0)
MCV: 88.7 fL (ref 80.0–100.0)
Platelets: 168 10*3/uL (ref 150–400)
RBC: 3.99 MIL/uL (ref 3.87–5.11)
RDW: 13.7 % (ref 11.5–15.5)
WBC: 10.2 10*3/uL (ref 4.0–10.5)
nRBC: 0 % (ref 0.0–0.2)

## 2023-10-19 LAB — URINALYSIS, ROUTINE W REFLEX MICROSCOPIC
Bacteria, UA: NONE SEEN
Bilirubin Urine: NEGATIVE
Glucose, UA: NEGATIVE mg/dL
Ketones, ur: NEGATIVE mg/dL
Nitrite: NEGATIVE
Protein, ur: NEGATIVE mg/dL
Specific Gravity, Urine: 1.023 (ref 1.005–1.030)
WBC, UA: >50 WBC/hpf (ref 0–5)
pH: 7 (ref 5.0–8.0)

## 2023-10-19 LAB — LIPASE, BLOOD: Lipase: 215 U/L — ABNORMAL HIGH (ref 11–51)

## 2023-10-19 LAB — TROPONIN I (HIGH SENSITIVITY): Troponin I (High Sensitivity): 9 ng/L (ref <18)

## 2023-10-19 LAB — RESP PANEL BY RT-PCR (RSV, FLU A&B, COVID)  RVPGX2
Influenza A by PCR: NEGATIVE
Influenza B by PCR: NEGATIVE
Resp Syncytial Virus by PCR: NEGATIVE
SARS Coronavirus 2 by RT PCR: NEGATIVE

## 2023-10-19 MED ORDER — CEPHALEXIN 250 MG PO CAPS
500.0000 mg | ORAL_CAPSULE | Freq: Once | ORAL | Status: AC
  Administered 2023-10-19: 500 mg via ORAL
  Filled 2023-10-19: qty 2

## 2023-10-19 MED ORDER — SODIUM CHLORIDE 0.9 % IV BOLUS
1000.0000 mL | Freq: Once | INTRAVENOUS | Status: AC
  Administered 2023-10-19: 1000 mL via INTRAVENOUS

## 2023-10-19 MED ORDER — IOHEXOL 300 MG/ML  SOLN
75.0000 mL | Freq: Once | INTRAMUSCULAR | Status: AC | PRN
  Administered 2023-10-19: 75 mL via INTRAVENOUS

## 2023-10-19 MED ORDER — CEPHALEXIN 500 MG PO CAPS: 500.0000 mg | ORAL_CAPSULE | Freq: Four times a day (QID) | ORAL | 0 refills | Status: DC

## 2023-10-19 MED ORDER — ACETAMINOPHEN 500 MG PO TABS
1000.0000 mg | ORAL_TABLET | Freq: Once | ORAL | Status: AC
  Administered 2023-10-19: 1000 mg via ORAL
  Filled 2023-10-19: qty 2

## 2023-10-19 NOTE — ED Provider Notes (Signed)
 [place] EMERGENCY DEPARTMENT AT [facility] ([place]) Provider Note  CSN: [id number] Arrival date & time: 10/19/23 1518  Chief Complaint(s) Fever and Weakness  HPI Sheila Hurst is a 83 y.o. 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  Patient reports that past few days she has had some upper abdominal pain, radiating to her back.  Today, she was in church and started having nausea and vomiting.  Also endorses some recent chills.  Reports currently, minimal pain, no ongoing nausea.  Her daughter has similar symptoms.  Denies any diarrhea, feels that she might be constipated.  No urinary symptoms.  No chest pain, shortness of breath.  No sore throat, runny nose.   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 Authorizing Provider  cephALEXin (KEFLEX) 500 MG capsule Take 1 capsule (500 mg total) by mouth 4 (four) times daily. 10/19/23  Yes Lonell Grandchild, MD  acetaminophen (TYLENOL) 650 MG CR  tablet Take 1,300 mg by mouth every 8 (eight) hours as needed.    [provider]  betamethasone dipropionate (DIPROLENE) 0.05 % ointment APPLY TOPICALLY TWICE DAILY TO THE GENITAL AREA    [provider]  Cholecalciferol 25 MCG (1000 UT) capsule Take 1,000 Units by mouth daily.    [provider]  denosumab (PROLIA) 60 MG/ML SOSY injection Inject 60 mg into the skin every 6 (six) months.    [provider]  docusate sodium (COLACE) 100 MG capsule Take 100 mg by mouth as needed.    [provider]  pantoprazole (PROTONIX) 40 MG tablet Take 1 tablet (40 mg total) by mouth daily. 03/22/14   Almond Lint, MD  potassium chloride (K-DUR) 10 MEQ tablet Take 10 mEq by mouth daily.  10/14/17   [provider]  triamterene-hydrochlorothiazide (MAXZIDE-25) 37.5-25 MG tablet Take 2 tablets by mouth daily. 06/10/21   [provider]  Past Surgical History Past Surgical History:  Procedure Laterality Date   APPENDECTOMY  41's   Badder tack  09-2011   BELPHAROPTOSIS REPAIR     CARDIAC CATHETERIZATION  12/2006   CHOLECYSTECTOMY  12/2012   EUS  09/15/2012   Procedure: ESOPHAGEAL ENDOSCOPIC ULTRASOUND (EUS) RADIAL;  Surgeon: Willis Modena, MD;  Location: [facility];  Service: Endoscopy;  Laterality: N/A;  + or - fna unsure of what scope   FINE NEEDLE ASPIRATION  09/15/2012   Procedure: FINE NEEDLE ASPIRATION (FNA) LINEAR;  Surgeon: Willis Modena, MD;  Location: [facility];  Service: Endoscopy;  Laterality: N/A;   LAPAROSCOPY N/A 01/28/2013   Procedure: LAPAROSCOPY DIAGNOSTIC,  WHIPPLE;  Surgeon: Almond Lint, MD;  Location: [facility];  Service: General;  Laterality: N/A;   PANCREATICODUODENECTOMY  12/2012   PORT-A-CATH REMOVAL Right 07/14/2014   Procedure: REMOVAL PORT-A-CATH;  Surgeon: Almond Lint, MD;  Location: [facility]  [place];  Service: General;  Laterality: Right;   PORTACATH PLACEMENT N/A 03/18/2013   Procedure: INSERTION PORT-A-CATH;  Surgeon: Almond Lint, MD;  Location: [facility];  Service: General;  Laterality: N/A;  Flouro time , 11 seconds.   TONSILLECTOMY     age 68   TOTAL ABDOMINAL HYSTERECTOMY W/ BILATERAL SALPINGOOPHORECTOMY  1993   TUBAL LIGATION  1977   WHIPPLE PROCEDURE N/A 01/28/2013   Procedure: WHIPPLE PROCEDURE;  Surgeon: Almond Lint, MD;  Location: [facility];  Service: General;  Laterality: N/A;   Family History Family History  Problem Relation Age of Onset   Alzheimer's disease Mother    Throat cancer Father    Rheum arthritis Sister    Heart murmur Sister    Osteoarthritis Sister    Prostate cancer Brother    Prostate cancer Brother    Stomach cancer Brother    Thyroid disease Brother    Heart murmur Brother    Alzheimer's disease Maternal Aunt    Pancreatic cancer Paternal Aunt    Alzheimer's disease Paternal Uncle     Social History Social History   Tobacco Use   Smoking status: Former    Current packs/day: 0.00    Average packs/day: 0.3 packs/day for 24.0 years (7.2 ttl pk-yrs)    Types: Cigarettes    Start date: 09/02/1954    Quit date: 09/02/1978    Years since quitting: 45.1    Passive exposure: Past   Smokeless tobacco: Never  Vaping Use   Vaping status: Never Used  Substance Use Topics   Alcohol use: No   Drug use: No   Allergies Lisinopril, Metoprolol, Solifenacin, and Ondansetron  Review of Systems Review of Systems  All other systems reviewed and are negative.   Physical Exam Vital Signs  I have reviewed the triage vital signs BP (!) 110/92   Pulse 60   Temp 98.8 F (37.1 C) (Oral)   Resp 16   Ht 4\' 11"  (1.499 m)   Wt 55.8 kg   SpO2 97%   BMI 24.84 kg/m  Physical Exam Vitals and nursing note reviewed.  Constitutional:      General: She is not in acute distress.    Appearance: She is well-developed.   HENT:     Head: Normocephalic and atraumatic.     Mouth/Throat:     Mouth: Mucous membranes are moist.  Eyes:     Pupils: Pupils are equal, round, and reactive to light.  Cardiovascular:     Rate and Rhythm: Normal rate and regular rhythm.  Heart sounds: No murmur heard. Pulmonary:     Effort: Pulmonary effort is normal. No respiratory distress.     Breath sounds: Normal breath sounds.  Abdominal:     General: Abdomen is flat.     Palpations: Abdomen is soft.     Tenderness: There is abdominal tenderness (diffuse, mild). There is no right CVA tenderness or left CVA tenderness.  Musculoskeletal:        General: No tenderness.     Right lower leg: No edema.     Left lower leg: No edema.  Skin:    General: Skin is warm and dry.  Neurological:     General: No focal deficit present.     Mental Status: She is alert. Mental status is at baseline.  Psychiatric:        Mood and Affect: Mood normal.        Behavior: Behavior normal.     ED Results and Treatments Labs (all labs ordered are listed, but only abnormal results are displayed) Labs Reviewed  LIPASE, BLOOD - Abnormal; Notable for the following components:      Result Value   Lipase 215 (*)    All other components within normal limits  COMPREHENSIVE METABOLIC PANEL - Abnormal; Notable for the following components:   Glucose, Bld 126 (*)    All other components within normal limits  CBC - Abnormal; Notable for the following components:   Hemoglobin 11.6 (*)    HCT 35.4 (*)    All other components within normal limits  URINALYSIS, ROUTINE W REFLEX MICROSCOPIC - Abnormal; Notable for the following components:   APPearance HAZY (*)    Hgb urine dipstick TRACE (*)    Leukocytes,Ua LARGE (*)    All other components within normal limits  RESP PANEL BY RT-PCR (RSV, FLU A&B, COVID)  RVPGX2  TROPONIN I (HIGH SENSITIVITY)                                                                                                                           Radiology CT ABDOMEN PELVIS W CONTRAST Result Date: 10/19/2023 CLINICAL DATA:  Acute abdominal pain EXAM: CT ABDOMEN AND PELVIS WITH CONTRAST TECHNIQUE: Multidetector CT imaging of the abdomen and pelvis was performed using the standard protocol following bolus administration of intravenous contrast. RADIATION DOSE REDUCTION: This exam was performed according to the departmental dose-optimization program which includes automated exposure control, adjustment of the mA and/or kV according to patient size and/or use of iterative reconstruction technique. CONTRAST:  75mL OMNIPAQUE IOHEXOL 300 MG/ML  SOLN COMPARISON:  12/07/2021 FINDINGS: Lower chest: No acute abnormality. Hepatobiliary: Geographic area of decreased attenuation within the posterior right lobe of the liver stable in appearance from the prior exam. Overlying calcification is seen. No significant enhancement is noted on delayed images. This lesion is unchanged over several series of images dating back to 2014 and felt to be benign in etiology. No follow-up is recommended. Gallbladder has been surgically removed.  Pancreas: Postsurgical changes are noted consistent with prior Whipple procedure. The pancreatic tail appears within normal limits. Spleen: Normal in size without focal abnormality. Adrenals/Urinary Tract: Adrenal glands are within normal limits. Kidneys demonstrate a normal enhancement pattern bilaterally. No renal calculi or obstructive changes are seen. The bladder is partially distended. Stomach/Bowel: Scattered diverticular change of the colon is noted. No evidence of diverticulitis is seen. A loop of mid transverse colon is noted within an umbilical hernia similar to that seen on prior exam. No obstructive changes are seen. Proximal colon is unremarkable. Postsurgical changes consistent with the Whipple procedure are noted within the stomach and small bowel. Vascular/Lymphatic: Aortic atherosclerosis. No enlarged  abdominal or pelvic lymph nodes. Reproductive: Status post hysterectomy. No adnexal masses. Other: No abdominal wall hernia or abnormality. No abdominopelvic ascites. 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 The overall appearance is stable. Degenerative changes of lumbar spine are noted. IMPRESSION: Stable ventral hernia at the umbilicus containing a loop of transverse colon. No obstructive changes are seen. Chronic changes as described above. Diverticulosis without diverticulitis. Electronically Signed   By: Alcide Clever M.D.   On: 10/19/2023 19:36   DG Chest 1 View Result Date: 10/19/2023 CLINICAL DATA:  Nausea and vomiting. EXAM: CHEST  1 VIEW COMPARISON:  09/08/2023 FINDINGS: Bilateral lungs appear hyperlucent with coarse bronchovascular markings, favoring underlying COPD. Bilateral lungs otherwise appear clear. No dense consolidation or lung collapse. Bilateral costophrenic angles are clear. Stable cardio-mediastinal silhouette. There is convexity in the region of main pulmonary trunk, favoring pulmonary artery hypertension. No acute osseous abnormalities. The soft tissues are within normal limits. No free air under the domes of diaphragm. IMPRESSION: No active disease.  Probable COPD. Electronically Signed   By: Jules Schick M.D.   On: 10/19/2023 16:55    Pertinent labs & imaging results that were available during my care of the patient were reviewed by me and considered in my medical decision making (see MDM for details).  Medications Ordered in ED Medications  cephALEXin (KEFLEX) capsule 500 mg (has no administration in time range)  sodium chloride 0.9 % bolus 1,000 mL ( Intravenous Stopped 10/19/23 1950)  acetaminophen (TYLENOL) tablet 1,000 mg (1,000 mg Oral Given 10/19/23 1819)  iohexol (OMNIPAQUE) 300 MG/ML solution 75 mL (75 mLs Intravenous Contrast Given 10/19/23 1848)                                                                                                                                      Procedures Procedures  (including critical care time)  Medical Decision Making / ED Course   MDM:  83 year old presenting to the emergency department with nausea, vomiting.  Patient overall well-appearing, currently reports that she feels much better.  Declines any pain or nausea medication.  Differential includes gastroenteritis, gastritis, pancreatitis, obstruction, perforation, recurrent malignancy, abscess, diverticulitis, volvulus, cholecystitis.  Labs notable for mild elevation  in lipase.  Will obtain CT abdomen to further evaluate for underlying process.  Denies URI symptoms and COVID swab is negative.  Still pending urinalysis.  Denies any cough to suggest pneumonia and lungs clear on exam.  Will give Tylenol.  Patient not septic appearing.  Clinical Course as of 10/19/23 2104  Wynelle Link Oct 19, 2023  2056 CT abdomen is negative.  Urinalysis does have a large amount of WBCs, leukocytes.  No bacteria.  COVID swab negative.  Given fever earlier, weakness and vomiting, will treat for possible UTI.  Patient overall feeling well. Will discharge patient to home. All questions answered. Patient comfortable with plan of discharge. Return precautions discussed with patient and specified on the after visit summary.  [WS]    Clinical Course User Index [WS] Suezanne Jacquet, Jerilee Field, MD     Additional history obtained: -Additional history obtained from family -External records from outside source obtained and reviewed including: Chart review including previous notes, labs, imaging, consultation notes including prior notes    Lab Tests: -I ordered, reviewed, and interpreted labs.   The pertinent results include:   Labs Reviewed  LIPASE, BLOOD - Abnormal; Notable for the following components:      Result Value   Lipase 215 (*)    All other components within normal limits  COMPREHENSIVE METABOLIC PANEL -  Abnormal; Notable for the following components:   Glucose, Bld 126 (*)    All other components within normal limits  CBC - Abnormal; Notable for the following components:   Hemoglobin 11.6 (*)    HCT 35.4 (*)    All other components within normal limits  URINALYSIS, ROUTINE W REFLEX MICROSCOPIC - Abnormal; Notable for the following components:   APPearance HAZY (*)    Hgb urine dipstick TRACE (*)    Leukocytes,Ua LARGE (*)    All other components within normal limits  RESP PANEL BY RT-PCR (RSV, FLU A&B, COVID)  RVPGX2  TROPONIN I (HIGH SENSITIVITY)    Notable for nonspecific lipase elevation, >50 wbc in urine   EKG   EKG Interpretation Date/Time:  Sunday October 19 2023 15:45:27 EST Ventricular Rate:  66 PR Interval:  154 QRS Duration:  102 QT Interval:  410 QTC Calculation: 429 R Axis:   50  Text Interpretation: Normal sinus rhythm Cannot rule out Anterior infarct (cited on or before 19-Oct-2023) Abnormal ECG When compared with ECG of 19-Oct-2023 15:44, Non-specific change in ST segment in Lateral leads Confirmed by Alvino Blood ([id number]) on 10/19/2023 4:36:40 PM         Imaging Studies ordered: I ordered imaging studies including CT abdomen On my interpretation imaging demonstrates no acute process I independently visualized and interpreted imaging. I agree with the radiologist interpretation   Medicines ordered and prescription drug management: Meds ordered this encounter  Medications   sodium chloride 0.9 % bolus 1,000 mL   acetaminophen (TYLENOL) tablet 1,000 mg   iohexol (OMNIPAQUE) 300 MG/ML solution 75 mL   cephALEXin (KEFLEX) capsule 500 mg   cephALEXin (KEFLEX) 500 MG capsule    Sig: Take 1 capsule (500 mg total) by mouth 4 (four) times daily.    Dispense:  28 capsule    Refill:  0    -I have reviewed the patients home medicines and have made adjustments as needed    Reevaluation: After the interventions noted above, I reevaluated the patient and  found that their symptoms have improved  Co morbidities that complicate the patient evaluation  Past Medical History:  Diagnosis Date   Arthritis    left knee   Bronchitis    GERD (gastroesophageal reflux disease)    no medication   Hyperlipidemia    Hypertension    Pancreatic mass 09/15/2012   Head of Pancreas, malignant cells consistent w/adenocarcinoma   Radiation 10/19/12-11/30/12   50.4 gray Pancreatic Ca/abdomen   Wears dentures    top   Wears glasses    Weight loss    Prior to Dx      Dispostion: Disposition decision including need for hospitalization was considered, and patient discharged from emergency department.    Final Clinical Impression(s) / ED Diagnoses Final diagnoses:  Fever, unspecified fever cause     This chart was dictated using voice recognition software.  Despite best efforts to proofread,  errors can occur which can change the documentation meaning.    Lonell Grandchild, MD 10/19/23 2104

## 2023-10-19 NOTE — ED Triage Notes (Signed)
 Pt states while she was in church today she started vomiting. Pt states she was having chills but family states she was nodding in and out of sleep. Pt also endorses being constipated.

## 2023-10-19 NOTE — Discharge Instructions (Addendum)
 We evaluated you for your fever and vomiting.  Your testing was overall reassuring.  We saw that your pancreas enzyme test was slightly elevated, however your CT scan did not show any dangerous findings or evidence of any recurrent tumor.  Your urine test did show some signs of a urinary infection.  Since we do not have another cause of your fever, we have treated you for a urinary infection with antibiotics.  Please take these as prescribed.  Your COVID test and flu test was negative.  Please be sure to stay hydrated.  Please follow-up closely with your primary doctor.  If you develop any new or worsening symptoms such as recurrent vomiting, lightheadedness or dizziness, fainting, difficulty breathing, chest pain, abdominal pain, or any other new symptoms, please return to the emergency department.

## 2023-10-20 ENCOUNTER — Ambulatory Visit: Payer: Medicare Other | Admitting: Rheumatology

## 2023-10-20 DIAGNOSIS — K8681 Exocrine pancreatic insufficiency: Secondary | ICD-10-CM

## 2023-10-20 DIAGNOSIS — M51369 Other intervertebral disc degeneration, lumbar region without mention of lumbar back pain or lower extremity pain: Secondary | ICD-10-CM

## 2023-10-20 DIAGNOSIS — M810 Age-related osteoporosis without current pathological fracture: Secondary | ICD-10-CM

## 2023-10-20 DIAGNOSIS — Z8719 Personal history of other diseases of the digestive system: Secondary | ICD-10-CM

## 2023-10-20 DIAGNOSIS — Z5181 Encounter for therapeutic drug level monitoring: Secondary | ICD-10-CM

## 2023-10-20 DIAGNOSIS — M170 Bilateral primary osteoarthritis of knee: Secondary | ICD-10-CM

## 2023-10-20 DIAGNOSIS — I10 Essential (primary) hypertension: Secondary | ICD-10-CM

## 2023-10-20 DIAGNOSIS — Z8639 Personal history of other endocrine, nutritional and metabolic disease: Secondary | ICD-10-CM

## 2023-10-20 DIAGNOSIS — R7303 Prediabetes: Secondary | ICD-10-CM

## 2023-10-20 DIAGNOSIS — M26609 Unspecified temporomandibular joint disorder, unspecified side: Secondary | ICD-10-CM

## 2023-10-20 DIAGNOSIS — C259 Malignant neoplasm of pancreas, unspecified: Secondary | ICD-10-CM

## 2023-10-20 DIAGNOSIS — E559 Vitamin D deficiency, unspecified: Secondary | ICD-10-CM

## 2023-10-20 IMAGING — MG MM DIGITAL SCREENING BILAT W/ TOMO AND CAD
6 of 10 series · 6 of 30 positions shown · non-contrast
Comparison: Previous exam(s).

CLINICAL DATA: Screening.

EXAM:
DIGITAL SCREENING BILATERAL MAMMOGRAM WITH TOMOSYNTHESIS AND CAD
TECHNIQUE: Bilateral screening digital craniocaudal and mediolateral oblique
mammograms were obtained. Bilateral screening digital breast
tomosynthesis was performed. The images were evaluated with
computer-aided detection.

[R CC synth-2D]
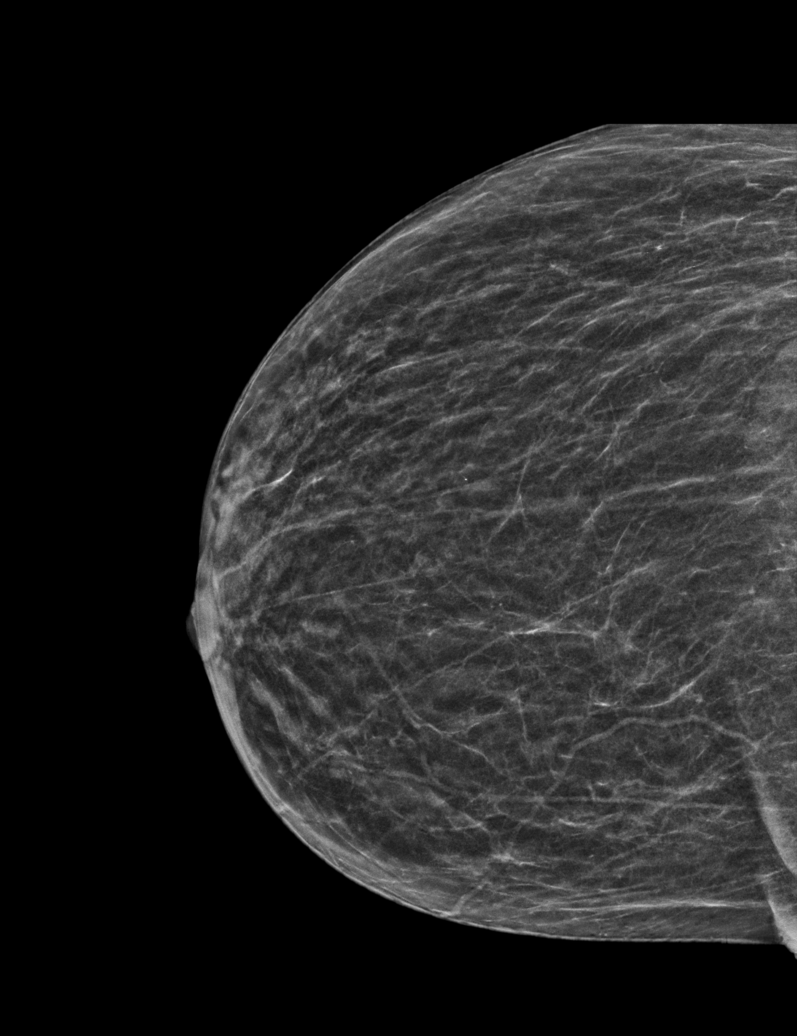

[R MLO synth-2D]
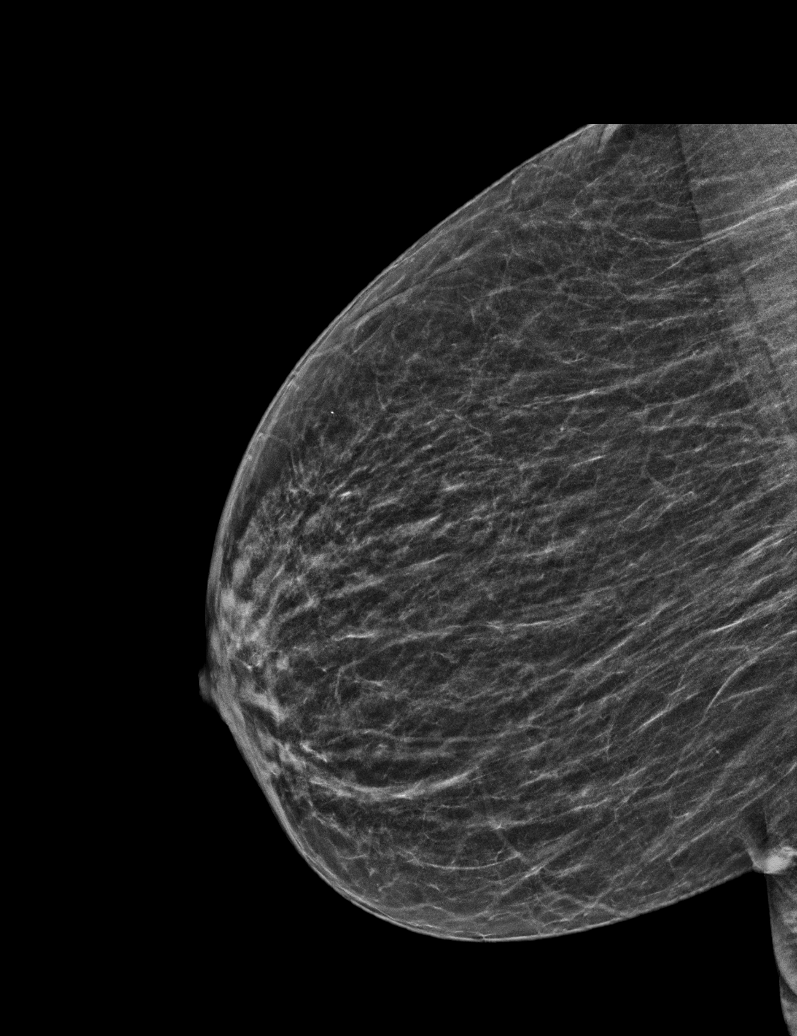

[L MLO synth-2D]
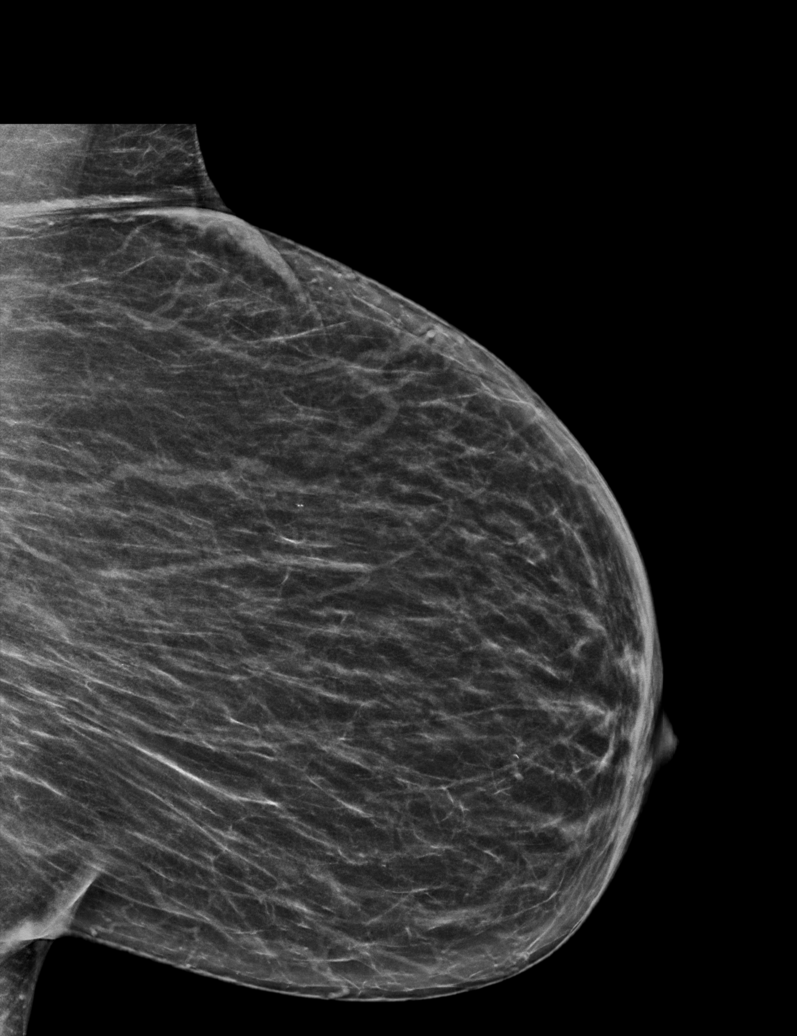

[L CC synth-2D (1 of 2)]
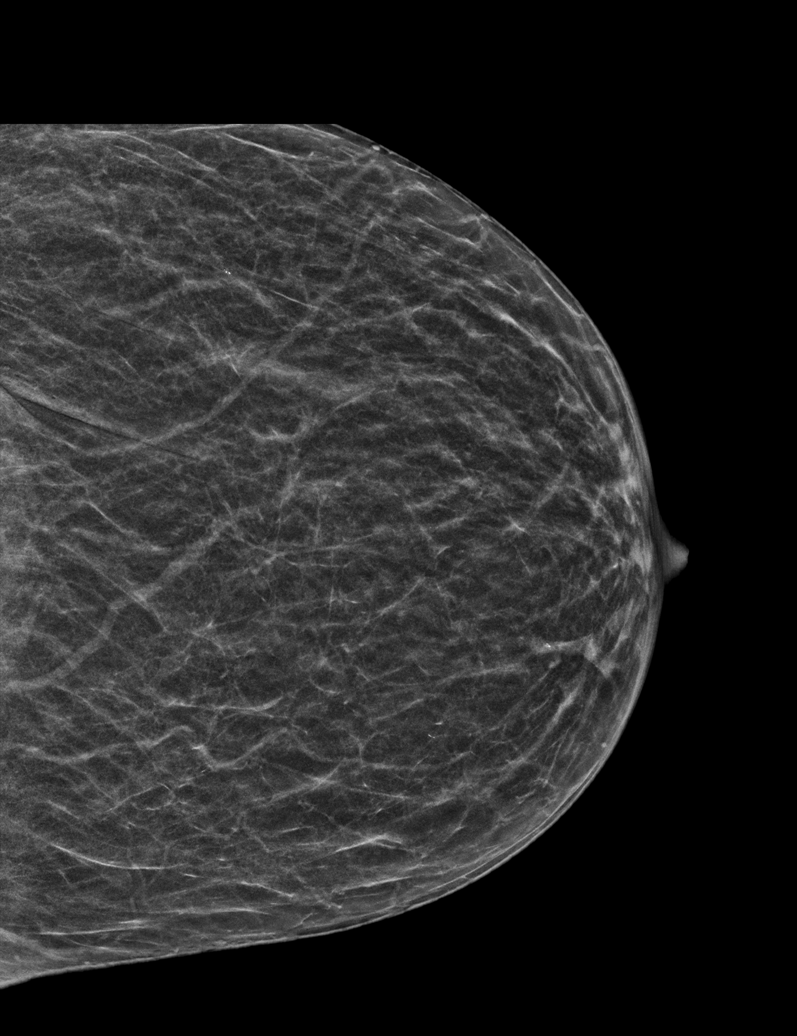

[L CC synth-2D (2 of 2)]
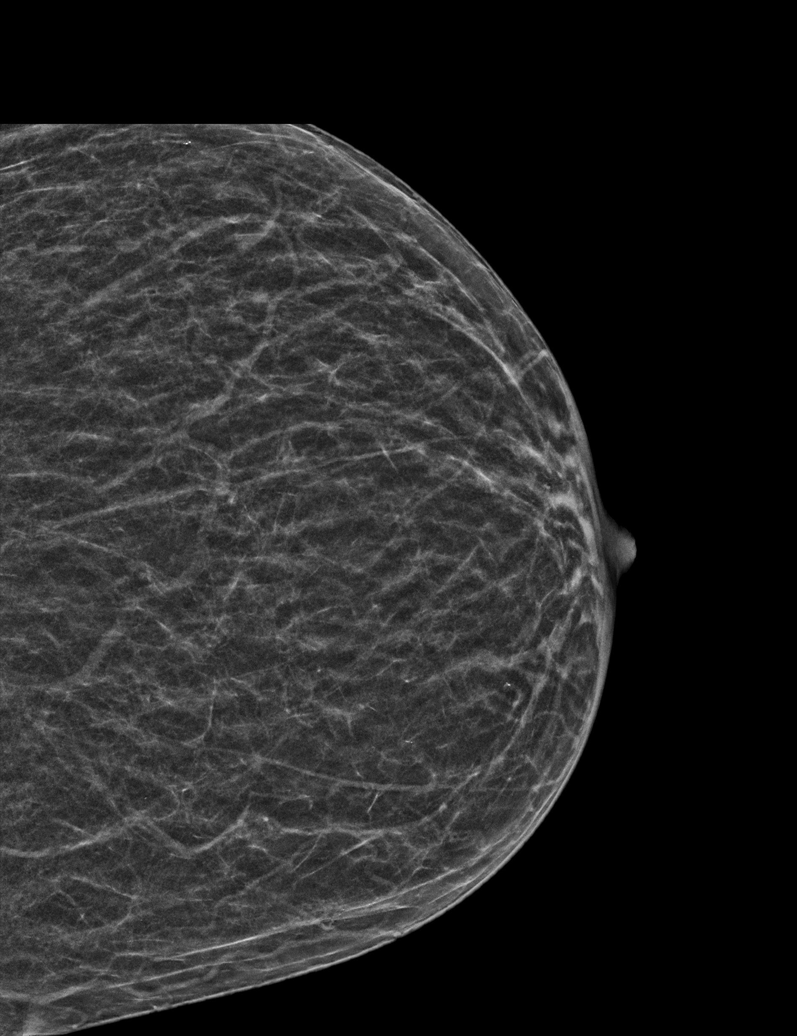

[L MLO tomo · tomo slice 26/51.0]
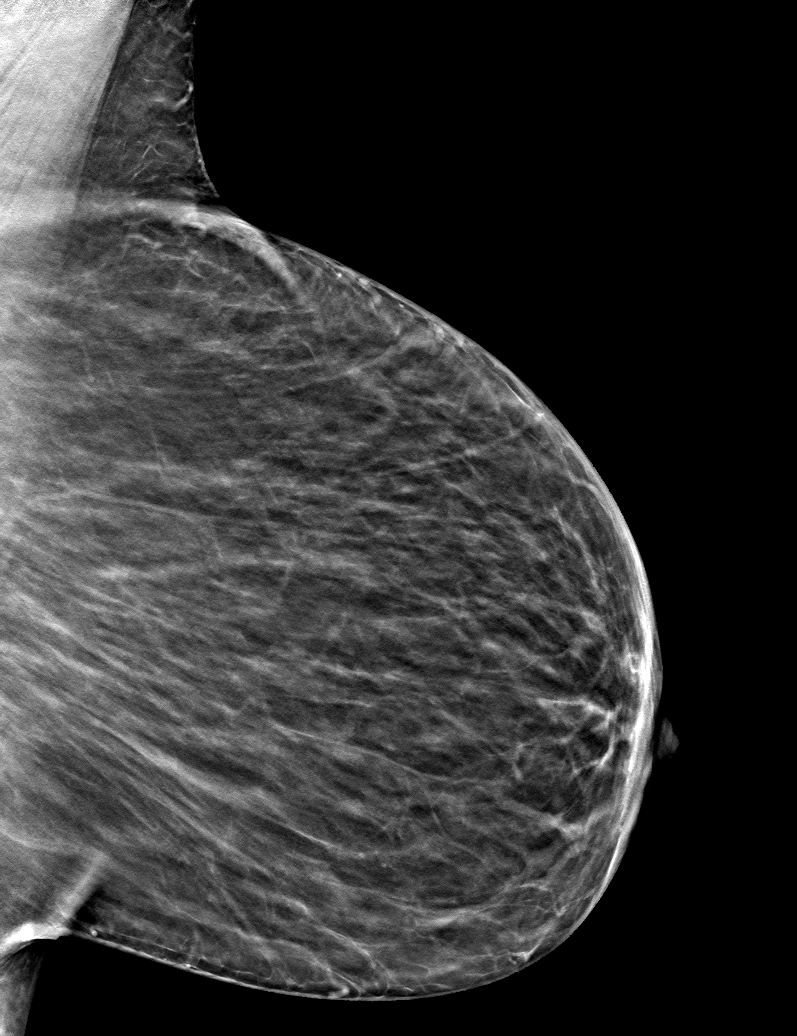

[6 of 30 positions shown; findings below may reference images not displayed]

ACR Breast Density Category b: There are scattered areas of
fibroglandular density.
FINDINGS: There are no findings suspicious for malignancy.
IMPRESSION: No mammographic evidence of malignancy. A result letter of this
screening mammogram will be mailed directly to the patient.

RECOMMENDATION:
Screening mammogram in one year. (Code:51-O-LD2)

BI-RADS CATEGORY  1: Negative.

## 2023-10-31 NOTE — Progress Notes (Unsigned)
 Office Visit Note  Patient: Sheila Hurst             Date of Birth: 1940-10-31           MRN: [id number]             PCP: Debroah Loop, DO Referring: Debroah Loop, DO Visit Date: 11/14/2023 Occupation: @GUAROCC @  Subjective:  Medication monitoring   History of Present Illness: Sheila Hurst is a 83 y.o. female with history of osteoporosis and osteoarthritis.   She is taking vitamin D 1000 units daily.   CBC and CMP updated on 10/19/23.    Activities of Daily Living:  Patient reports morning stiffness for *** {minute/hour:19697}.   Patient {ACTIONS;DENIES/REPORTS:21021675::"Denies"} nocturnal pain.  Difficulty dressing/grooming: {ACTIONS;DENIES/REPORTS:21021675::"Denies"} Difficulty climbing stairs: {ACTIONS;DENIES/REPORTS:21021675::"Denies"} Difficulty getting out of chair: {ACTIONS;DENIES/REPORTS:21021675::"Denies"} Difficulty using hands for taps, buttons, cutlery, and/or writing: {ACTIONS;DENIES/REPORTS:21021675::"Denies"}  No Rheumatology ROS completed.   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/2012    Past Medical History:  Diagnosis Date   Arthritis    left knee   Bronchitis    GERD (gastroesophageal reflux disease)    no medication   Hyperlipidemia    Hypertension    Pancreatic mass 09/15/2012   Head of Pancreas, malignant cells consistent w/adenocarcinoma   Radiation 10/19/12-11/30/12   50.4 gray Pancreatic Ca/abdomen   Wears dentures    top   Wears glasses    Weight loss    Prior to Dx    Family History  Problem Relation Age of Onset   Alzheimer's  disease Mother    Throat cancer Father    Rheum arthritis Sister    Heart murmur Sister    Osteoarthritis Sister    Prostate cancer Brother    Prostate cancer Brother    Stomach cancer Brother    Thyroid disease Brother    Heart murmur Brother    Alzheimer's disease Maternal Aunt    Pancreatic cancer Paternal Aunt    Alzheimer's disease Paternal Uncle    Past Surgical History:  Procedure Laterality Date   APPENDECTOMY  70's   Badder tack  09-2011   BELPHAROPTOSIS REPAIR     CARDIAC CATHETERIZATION  12/2006   CHOLECYSTECTOMY  12/2012   EUS  09/15/2012   Procedure: ESOPHAGEAL ENDOSCOPIC ULTRASOUND (EUS) RADIAL;  Surgeon: Willis Modena, MD;  Location: [facility];  Service: Endoscopy;  Laterality: N/A;  + or - fna unsure of what scope   FINE NEEDLE ASPIRATION  09/15/2012   Procedure: FINE NEEDLE ASPIRATION (FNA) LINEAR;  Surgeon: Willis Modena, MD;  Location: [facility];  Service: Endoscopy;  Laterality: N/A;   LAPAROSCOPY N/A 01/28/2013   Procedure: LAPAROSCOPY DIAGNOSTIC,  WHIPPLE;  Surgeon: Almond Lint, MD;  Location: [facility];  Service: General;  Laterality: N/A;   PANCREATICODUODENECTOMY  12/2012   PORT-A-CATH REMOVAL Right 07/14/2014   Procedure: REMOVAL PORT-A-CATH;  Surgeon: Almond Lint, MD;  Location: [facility];  Service: General;  Laterality: Right;   PORTACATH PLACEMENT N/A 03/18/2013   Procedure: INSERTION PORT-A-CATH;  Surgeon: Almond Lint, MD;  Location: [facility];  Service: General;  Laterality:  N/A;  Flouro time , 11 seconds.   TONSILLECTOMY     age 69   TOTAL ABDOMINAL HYSTERECTOMY W/ BILATERAL SALPINGOOPHORECTOMY  1993   TUBAL LIGATION  1977   WHIPPLE PROCEDURE N/A 01/28/2013   Procedure: WHIPPLE PROCEDURE;  Surgeon: Almond Lint, MD;  Location: [facility];  Service: General;  Laterality: N/A;   Social History   Social History Narrative   Widowed-lives alone   1 son , 1 daughter   Retired-customer service with [employer] History  Administered Date(s) Administered   Influenza, High Dose Seasonal PF 05/28/2023   Influenza-Unspecified 05/07/2018, 05/22/2022   Moderna SARS-COV2 Booster Vaccination 09/14/2020   Moderna Sars-Covid-2 Vaccination 10/25/2019, 11/23/2019   Zoster Recombinant(Shingrix) 05/22/2022   Zoster, Live 05/28/2023     Objective: Vital Signs: There were no vitals taken for this visit.   Physical Exam Vitals and nursing note reviewed.  Constitutional:      Appearance: She is well-developed.  HENT:     Head: Normocephalic and atraumatic.  Eyes:     Conjunctiva/sclera: Conjunctivae normal.  Cardiovascular:     Rate and Rhythm: Normal rate and regular rhythm.     Heart sounds: Normal heart sounds.  Pulmonary:     Effort: Pulmonary effort is normal.     Breath sounds: Normal breath sounds.  Abdominal:     General: Bowel sounds are normal.     Palpations: Abdomen is soft.  Musculoskeletal:     Cervical back: Normal range of motion.  Lymphadenopathy:     Cervical: No cervical adenopathy.  Skin:    General: Skin is warm and dry.     Capillary Refill: Capillary refill takes less than 2 seconds.  Neurological:     Mental Status: She is alert and oriented to person, place, and time.  Psychiatric:        Behavior: Behavior normal.      Musculoskeletal Exam: ***  CDAI Exam: CDAI Score: -- Patient Global: --; Provider Global: -- Swollen: --; Tender: -- Joint Exam 11/14/2023   No joint exam has been documented for this visit   There is currently no information documented on the homunculus. Go to the Rheumatology activity and complete the homunculus joint exam.  Investigation: No additional findings.  Imaging: CT ABDOMEN PELVIS W CONTRAST Result Date: 10/19/2023 CLINICAL DATA:  Acute abdominal pain EXAM: CT ABDOMEN AND PELVIS WITH CONTRAST TECHNIQUE: Multidetector CT imaging of the abdomen and pelvis was performed using the standard protocol  following bolus administration of intravenous contrast. RADIATION DOSE REDUCTION: This exam was performed according to the departmental dose-optimization program which includes automated exposure control, adjustment of the mA and/or kV according to patient size and/or use of iterative reconstruction technique. CONTRAST:  75mL OMNIPAQUE IOHEXOL 300 MG/ML  SOLN COMPARISON:  12/07/2021 FINDINGS: Lower chest: No acute abnormality. Hepatobiliary: Geographic area of decreased attenuation within the posterior right lobe of the liver stable in appearance from the prior exam. Overlying calcification is seen. No significant enhancement is noted on delayed images. This lesion is unchanged over several series of images dating back to 2014 and felt to be benign in etiology. No follow-up is recommended. Gallbladder has been surgically removed. Pancreas: Postsurgical changes are noted consistent with prior Whipple procedure. The pancreatic tail appears within normal limits. Spleen: Normal in size without focal abnormality. Adrenals/Urinary Tract: Adrenal glands are within normal limits. Kidneys demonstrate a normal enhancement pattern bilaterally. No renal calculi or obstructive changes are seen. The bladder is partially distended. Stomach/Bowel:  Scattered diverticular change of the colon is noted. No evidence of diverticulitis is seen. A loop of mid transverse colon is noted within an umbilical hernia similar to that seen on prior exam. No obstructive changes are seen. Proximal colon is unremarkable. Postsurgical changes consistent with the Whipple procedure are noted within the stomach and small bowel. Vascular/Lymphatic: Aortic atherosclerosis. No enlarged abdominal or pelvic lymph nodes. Reproductive: Status post hysterectomy. No adnexal masses. Other: No abdominal wall hernia or abnormality. No abdominopelvic ascites. Musculoskeletal: Coarsened trabeculation is noted within the right hemipelvis likely related to underlying  Paget's. The overall appearance is stable. Degenerative changes of lumbar spine are noted. IMPRESSION: Stable ventral hernia at the umbilicus containing a loop of transverse colon. No obstructive changes are seen. Chronic changes as described above. Diverticulosis without diverticulitis. Electronically Signed   By: Alcide Clever M.D.   On: 10/19/2023 19:36   DG Chest 1 View Result Date: 10/19/2023 CLINICAL DATA:  Nausea and vomiting. EXAM: CHEST  1 VIEW COMPARISON:  09/08/2023 FINDINGS: Bilateral lungs appear hyperlucent with coarse bronchovascular markings, favoring underlying COPD. Bilateral lungs otherwise appear clear. No dense consolidation or lung collapse. Bilateral costophrenic angles are clear. Stable cardio-mediastinal silhouette. There is convexity in the region of main pulmonary trunk, favoring pulmonary artery hypertension. No acute osseous abnormalities. The soft tissues are within normal limits. No free air under the domes of diaphragm. IMPRESSION: No active disease.  Probable COPD. Electronically Signed   By: Jules Schick M.D.   On: 10/19/2023 16:55   MM 3D SCREENING MAMMOGRAM BILATERAL BREAST Result Date: 10/04/2023 CLINICAL DATA:  Screening. EXAM: DIGITAL SCREENING BILATERAL MAMMOGRAM WITH TOMOSYNTHESIS AND CAD TECHNIQUE: Bilateral screening digital craniocaudal and mediolateral oblique mammograms were obtained. Bilateral screening digital breast tomosynthesis was performed. The images were evaluated with computer-aided detection. COMPARISON:  Previous exam(s). ACR Breast Density Category b: There are scattered areas of fibroglandular density. FINDINGS: There are no findings suspicious for malignancy. IMPRESSION: No mammographic evidence of malignancy. A result letter of this screening mammogram will be mailed directly to the patient. RECOMMENDATION: Screening mammogram in one year. (Code:SM-B-01Y) BI-RADS CATEGORY  1: Negative. Electronically Signed   By: Frederico Hamman M.D.   On:  10/04/2023 10:08    Recent Labs: Lab Results  Component Value Date   WBC 10.2 10/19/2023   HGB 11.6 (L) 10/19/2023   PLT 168 10/19/2023   NA 136 10/19/2023   K 3.6 10/19/2023   CL 99 10/19/2023   CO2 28 10/19/2023   GLUCOSE 126 (H) 10/19/2023   BUN 15 10/19/2023   CREATININE 0.85 10/19/2023   BILITOT 1.0 10/19/2023   ALKPHOS 88 10/19/2023   AST 28 10/19/2023   ALT 15 10/19/2023   PROT 7.1 10/19/2023   ALBUMIN 3.9 10/19/2023   CALCIUM 9.5 10/19/2023   GFRAA 75 (L) 10/10/2013    Speciality Comments: History of radiation therapy-cannot take Tymlos or Forteo.  Procedures:  No procedures performed Allergies: Lisinopril, Metoprolol, Solifenacin, and Ondansetron   Assessment / Plan:     Visit Diagnoses: Age-related osteoporosis without current pathological fracture  Medication monitoring encounter  Vitamin D deficiency  Primary osteoarthritis of both knees  Degeneration of intervertebral disc of lumbar region without discogenic back pain or lower extremity pain  TMJ (temporomandibular joint disorder)  Pancreatic adenocarcinoma (HCC)  Exocrine pancreatic insufficiency  Primary hypertension  History of hyperlipidemia  History of gastroesophageal reflux (GERD)  Prediabetes  Orders: No orders of the defined types were placed in this encounter.  No orders of  the defined types were placed in this encounter.   Face-to-face time spent with patient was *** minutes. Greater than 50% of time was spent in counseling and coordination of care.  Follow-Up Instructions: No follow-ups on file.   Gearldine Bienenstock, PA-C  Note - This record has been created using Dragon software.  Chart creation errors have been sought, but may not always  have been located. Such creation errors do not reflect on  the standard of medical care.

## 2023-11-14 ENCOUNTER — Encounter: Payer: Self-pay | Admitting: Physician Assistant

## 2023-11-14 ENCOUNTER — Ambulatory Visit: Payer: Medicare Other | Attending: Physician Assistant | Admitting: Physician Assistant

## 2023-11-14 VITALS — BP 160/78 | HR 59 | Resp 14 | Ht 60.0 in | Wt 127.0 lb

## 2023-11-14 DIAGNOSIS — M26609 Unspecified temporomandibular joint disorder, unspecified side: Secondary | ICD-10-CM

## 2023-11-14 DIAGNOSIS — M51369 Other intervertebral disc degeneration, lumbar region without mention of lumbar back pain or lower extremity pain: Secondary | ICD-10-CM

## 2023-11-14 DIAGNOSIS — E559 Vitamin D deficiency, unspecified: Secondary | ICD-10-CM

## 2023-11-14 DIAGNOSIS — Z8719 Personal history of other diseases of the digestive system: Secondary | ICD-10-CM

## 2023-11-14 DIAGNOSIS — R7303 Prediabetes: Secondary | ICD-10-CM

## 2023-11-14 DIAGNOSIS — I10 Essential (primary) hypertension: Secondary | ICD-10-CM

## 2023-11-14 DIAGNOSIS — M170 Bilateral primary osteoarthritis of knee: Secondary | ICD-10-CM

## 2023-11-14 DIAGNOSIS — C259 Malignant neoplasm of pancreas, unspecified: Secondary | ICD-10-CM

## 2023-11-14 DIAGNOSIS — Z8639 Personal history of other endocrine, nutritional and metabolic disease: Secondary | ICD-10-CM

## 2023-11-14 DIAGNOSIS — Z5181 Encounter for therapeutic drug level monitoring: Secondary | ICD-10-CM | POA: Diagnosis not present

## 2023-11-14 DIAGNOSIS — M810 Age-related osteoporosis without current pathological fracture: Secondary | ICD-10-CM

## 2023-11-14 DIAGNOSIS — K8681 Exocrine pancreatic insufficiency: Secondary | ICD-10-CM

## 2023-11-15 LAB — COMPLETE METABOLIC PANEL WITH GFR
AG Ratio: 1.3 (calc) (ref 1.0–2.5)
ALT: 12 U/L (ref 6–29)
AST: 18 U/L (ref 10–35)
Albumin: 4 g/dL (ref 3.6–5.1)
Alkaline phosphatase (APISO): 100 U/L (ref 37–153)
BUN: 18 mg/dL (ref 7–25)
CO2: 33 mmol/L — ABNORMAL HIGH (ref 20–32)
Calcium: 9.5 mg/dL (ref 8.6–10.4)
Chloride: 102 mmol/L (ref 98–110)
Creat: 0.83 mg/dL (ref 0.60–0.95)
Globulin: 3 g/dL (ref 1.9–3.7)
Glucose, Bld: 103 mg/dL (ref 65–139)
Potassium: 3.7 mmol/L (ref 3.5–5.3)
Sodium: 141 mmol/L (ref 135–146)
Total Bilirubin: 0.6 mg/dL (ref 0.2–1.2)
Total Protein: 7 g/dL (ref 6.1–8.1)
eGFR: 71 mL/min/{1.73_m2} (ref >=60)

## 2023-11-15 LAB — VITAMIN D 25 HYDROXY (VIT D DEFICIENCY, FRACTURES): Vit D, 25-Hydroxy: 23 ng/mL — ABNORMAL LOW (ref 30–100)

## 2023-11-16 NOTE — Progress Notes (Signed)
 Vitamin D Low-23-please notify the patient and send in vitamin D 50,000 units once weekly x3 months.  Recheck in 3 months.  CMP WNL

## 2023-11-17 ENCOUNTER — Other Ambulatory Visit: Payer: Self-pay | Admitting: *Deleted

## 2023-11-17 DIAGNOSIS — E559 Vitamin D deficiency, unspecified: Secondary | ICD-10-CM

## 2023-11-17 MED ORDER — VITAMIN D (ERGOCALCIFEROL) 1.25 MG (50000 UNIT) PO CAPS
50000.0000 [IU] | ORAL_CAPSULE | ORAL | 0 refills | Status: DC
Start: 2023-11-17 — End: 2024-07-06

## 2024-01-13 ENCOUNTER — Other Ambulatory Visit: Payer: Self-pay | Admitting: Pharmacist

## 2024-01-13 ENCOUNTER — Telehealth: Payer: Self-pay | Admitting: Pharmacy Technician

## 2024-01-13 DIAGNOSIS — M810 Age-related osteoporosis without current pathological fracture: Secondary | ICD-10-CM | POA: Insufficient documentation

## 2024-01-13 NOTE — Progress Notes (Signed)
 Next Prolia  SQ overdue Diagnosis: age-related osteoporosis  Dose: 60 mg SQ every 6 months  Last Clinic Visit: 11/14/23 Next Clinic Visit: 05/19/24  Last Prolia  dose: 12/26/22  Labs: 11/14/23  Orders placed for Prolia  at [facility]. No premedicatons required.   Geraldene Kleine, PharmD, MPH, BCPS, CPP Clinical Pharmacist (Rheumatology and Pulmonology)

## 2024-01-13 NOTE — Telephone Encounter (Signed)
 Auth Submission: APPROVED Site of care: Site of care: [facility] Payer: [employer] Medication & CPT/J Code(s) submitted: Prolia  (Denosumab ) [id number] Route of submission (phone, fax, portal):  Phone # Fax # Auth type: Buy/Bill PB Units/visits requested: 60MG  Q6MONTHS X 2 DOSES Reference number: [id number] Approval from: 01/13/24 to 01/12/25

## 2024-01-19 DIAGNOSIS — B351 Tinea unguium: Secondary | ICD-10-CM | POA: Diagnosis not present

## 2024-01-19 DIAGNOSIS — L84 Corns and callosities: Secondary | ICD-10-CM | POA: Diagnosis not present

## 2024-01-19 DIAGNOSIS — M2042 Other hammer toe(s) (acquired), left foot: Secondary | ICD-10-CM | POA: Diagnosis not present

## 2024-01-19 DIAGNOSIS — M2041 Other hammer toe(s) (acquired), right foot: Secondary | ICD-10-CM | POA: Diagnosis not present

## 2024-01-19 DIAGNOSIS — I739 Peripheral vascular disease, unspecified: Secondary | ICD-10-CM | POA: Diagnosis not present

## 2024-01-21 DIAGNOSIS — M25562 Pain in left knee: Secondary | ICD-10-CM | POA: Diagnosis not present

## 2024-01-21 DIAGNOSIS — M1712 Unilateral primary osteoarthritis, left knee: Secondary | ICD-10-CM | POA: Diagnosis not present

## 2024-01-22 NOTE — Progress Notes (Signed)
 Prolia  scheduled for 01/29/24

## 2024-01-29 ENCOUNTER — Ambulatory Visit (INDEPENDENT_AMBULATORY_CARE_PROVIDER_SITE_OTHER)

## 2024-01-29 VITALS — BP 189/87 | HR 53 | Temp 97.4°F | Resp 16 | Ht 60.0 in | Wt 124.6 lb

## 2024-01-29 DIAGNOSIS — M810 Age-related osteoporosis without current pathological fracture: Secondary | ICD-10-CM | POA: Diagnosis not present

## 2024-01-29 MED ORDER — DENOSUMAB 60 MG/ML ~~LOC~~ SOSY
60.0000 mg | PREFILLED_SYRINGE | Freq: Once | SUBCUTANEOUS | Status: AC
  Administered 2024-01-29: 60 mg via SUBCUTANEOUS
  Filled 2024-01-29: qty 1

## 2024-01-29 NOTE — Progress Notes (Signed)
 Diagnosis: Osteoporosis  Provider:  Chilton Greathouse MD  Procedure: Injection  Prolia (Denosumab), Dose: 60 mg, Site: subcutaneous, Number of injections: 1  Injection Site(s): Right arm  Post Care: Patient declined observation  Discharge: Condition: Good, Destination: Home . AVS Provided  Performed by:  Wyvonne Lenz, RN

## 2024-02-12 ENCOUNTER — Other Ambulatory Visit: Payer: Self-pay | Admitting: Physician Assistant

## 2024-02-12 DIAGNOSIS — E559 Vitamin D deficiency, unspecified: Secondary | ICD-10-CM

## 2024-05-07 NOTE — Progress Notes (Deleted)
 Office Visit Note  Patient: Sheila Hurst             Date of Birth: [birth year]           MRN: [id number]             PCP: Auston Opal, DO Referring: Auston Opal, DO Visit Date: 05/19/2024 Occupation: @GUAROCC @  Subjective:  No chief complaint on file.   History of Present Illness: Sheila Hurst is a 83 y.o. female ***     Activities of Daily Living:  Patient reports morning stiffness for *** {minute/hour:19697}.   Patient {ACTIONS;DENIES/REPORTS:21021675::Denies} nocturnal pain.  Difficulty dressing/grooming: {ACTIONS;DENIES/REPORTS:21021675::Denies} Difficulty climbing stairs: {ACTIONS;DENIES/REPORTS:21021675::Denies} Difficulty getting out of chair: {ACTIONS;DENIES/REPORTS:21021675::Denies} Difficulty using hands for taps, buttons, cutlery, and/or writing: {ACTIONS;DENIES/REPORTS:21021675::Denies}  No Rheumatology ROS completed.   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   Malignant neoplasm of head of pancreas (HCC) 10/01/2012   Hyperlipidemia    Arthritis    GERD (gastroesophageal reflux disease)    Hypertension    Pancreatic adenocarcinoma (HCC) 09/29/2012    Past Medical History:  Diagnosis Date   Arthritis    left knee   Bronchitis    GERD (gastroesophageal reflux disease)    no medication   Hyperlipidemia    Hypertension    Pancreatic mass 09/15/2012   Head of Pancreas, malignant cells consistent w/adenocarcinoma   Radiation 10/19/12-11/30/12   50.4 gray Pancreatic Ca/abdomen   Wears dentures    top   Wears glasses    Weight loss    Prior to Dx    Family History  Problem Relation Age of Onset   Alzheimer's disease Mother    Throat cancer Father    Rheum arthritis Sister    Heart  murmur Sister    Osteoarthritis Sister    Prostate cancer Brother    Prostate cancer Brother    Stomach cancer Brother    Thyroid  disease Brother    Heart murmur Brother    Alzheimer's disease Maternal Aunt    Pancreatic cancer Paternal Aunt    Alzheimer's disease Paternal Uncle    Past Surgical History:  Procedure Laterality Date   APPENDECTOMY  70's   Badder tack  09-2011   BELPHAROPTOSIS REPAIR     CARDIAC CATHETERIZATION  12/2006   CHOLECYSTECTOMY  12/2012   EUS  09/15/2012   Procedure: ESOPHAGEAL ENDOSCOPIC ULTRASOUND (EUS) RADIAL;  Surgeon: Elsie Cree, MD;  Location: [facility];  Service: Endoscopy;  Laterality: N/A;  + or - fna unsure of what scope   FINE NEEDLE ASPIRATION  09/15/2012   Procedure: FINE NEEDLE ASPIRATION (FNA) LINEAR;  Surgeon: Elsie Cree, MD;  Location: [facility];  Service: Endoscopy;  Laterality: N/A;   LAPAROSCOPY N/A 01/28/2013   Procedure: LAPAROSCOPY DIAGNOSTIC,  WHIPPLE;  Surgeon: Jina Nephew, MD;  Location: [facility];  Service: General;  Laterality: N/A;   PANCREATICODUODENECTOMY  12/2012   PORT-A-CATH REMOVAL Right 07/14/2014   Procedure: REMOVAL PORT-A-CATH;  Surgeon: Jina Nephew, MD;  Location: [facility];  Service: General;  Laterality: Right;   PORTACATH PLACEMENT N/A 03/18/2013   Procedure: INSERTION PORT-A-CATH;  Surgeon: Jina Nephew, MD;  Location: [facility];  Service: General;  Laterality: N/A;  Flouro time , 11 seconds.   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   Physical Exam   Musculoskeletal Exam: ***  CDAI Exam: CDAI Score: -- Patient Global: --; Provider Global: -- Swollen: --; Tender: -- Joint Exam 05/19/2024   No joint exam has been documented for this visit   There is currently no information documented on the homunculus. Go to the Rheumatology activity and complete the homunculus joint exam.  Investigation: No additional findings.  Imaging: No results found.  Recent Labs: Lab Results  Component Value Date   WBC 10.2 10/19/2023   HGB 11.6 (L) 10/19/2023   PLT 168 10/19/2023   NA 141 11/14/2023   K 3.7 11/14/2023   CL 102 11/14/2023   CO2 33 (H) 11/14/2023   GLUCOSE 103 11/14/2023   BUN 18 11/14/2023   CREATININE 0.83 11/14/2023   BILITOT 0.6 11/14/2023   ALKPHOS 88 10/19/2023   AST 18 11/14/2023   ALT 12 11/14/2023   PROT 7.0 11/14/2023   ALBUMIN 3.9 10/19/2023   CALCIUM 9.5 11/14/2023   GFRAA 75 (L) 10/10/2013    Speciality Comments: History of radiation therapy-cannot take Tymlos or Forteo.  Procedures:  No procedures performed Allergies: Lisinopril, Metoprolol , Solifenacin, and Ondansetron    Assessment / Plan:     Visit Diagnoses: No diagnosis found.  Orders: No orders of the defined types were placed in this encounter.  No orders of the defined types were placed in this encounter.   Face-to-face time spent with patient was *** minutes. Greater than 50% of time was spent in counseling and coordination of care.  Follow-Up Instructions: No follow-ups on file.   Daved JAYSON Gavel, CMA  Note - This record has been created using Animal nutritionist.  Chart creation errors  have been sought, but may not always  have been located. Such creation errors do not reflect on  the standard of medical care.

## 2024-05-18 ENCOUNTER — Ambulatory Visit: Admitting: Rheumatology

## 2024-05-19 ENCOUNTER — Ambulatory Visit: Admitting: Rheumatology

## 2024-05-19 DIAGNOSIS — Z8719 Personal history of other diseases of the digestive system: Secondary | ICD-10-CM

## 2024-05-19 DIAGNOSIS — M170 Bilateral primary osteoarthritis of knee: Secondary | ICD-10-CM

## 2024-05-19 DIAGNOSIS — E559 Vitamin D deficiency, unspecified: Secondary | ICD-10-CM

## 2024-05-19 DIAGNOSIS — I10 Essential (primary) hypertension: Secondary | ICD-10-CM

## 2024-05-19 DIAGNOSIS — M51369 Other intervertebral disc degeneration, lumbar region without mention of lumbar back pain or lower extremity pain: Secondary | ICD-10-CM

## 2024-05-19 DIAGNOSIS — R7303 Prediabetes: Secondary | ICD-10-CM

## 2024-05-19 DIAGNOSIS — M26609 Unspecified temporomandibular joint disorder, unspecified side: Secondary | ICD-10-CM

## 2024-05-19 DIAGNOSIS — M810 Age-related osteoporosis without current pathological fracture: Secondary | ICD-10-CM

## 2024-05-19 DIAGNOSIS — C259 Malignant neoplasm of pancreas, unspecified: Secondary | ICD-10-CM

## 2024-05-19 DIAGNOSIS — Z8639 Personal history of other endocrine, nutritional and metabolic disease: Secondary | ICD-10-CM

## 2024-05-19 DIAGNOSIS — Z5181 Encounter for therapeutic drug level monitoring: Secondary | ICD-10-CM

## 2024-05-19 DIAGNOSIS — K8681 Exocrine pancreatic insufficiency: Secondary | ICD-10-CM

## 2024-06-02 DIAGNOSIS — G3184 Mild cognitive impairment, so stated: Secondary | ICD-10-CM | POA: Diagnosis not present

## 2024-06-02 DIAGNOSIS — H9042 Sensorineural hearing loss, unilateral, left ear, with unrestricted hearing on the contralateral side: Secondary | ICD-10-CM | POA: Diagnosis not present

## 2024-06-02 NOTE — Progress Notes (Signed)
 ------------------------------------------------------------------------------- Attestation signed by Arvin Hoff, MD at 06/02/2024 12:23 PM I performed the service or was physically present during the critical or key portions of the service furnished by me; and he/she managed the patient. [name] continues to appear to have MCI. No changes made.  Arvin Hoff, MD 06/02/2024  -------------------------------------------------------------------------------  Memory Assessment Clinic Follow-Up  Subjective:     Patient ID: Sheila Hurst is a 83 y.o. y.o. female with MCI who is here for follow-up of same.   Last Evaluation on 05/17/2023 Concluded: 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 She tested impaired in multiple memory domains: executive function, visuospatial memory, language, and attention. Her short-term memory is actually not as bad as the other domains of memory.  Referred to audiology to evaluate and treat for L hearing loss. Discussed the relationship between hearing and memory.   HPI today: Patient is here with her son Marzella  Patient feels like she is able to do all the things she likes to do.  She goes to church and the [facility] and often travels with her friend to go to the store and shopping.  Her children help her to keep appointments and doctors visits.  They also are very supportive at home checking on her and making sure she has what she needs.  She states that her daughter encouraged her to start drinking Ensure because of some weight loss and she states that she has put on almost 10 pounds.  She states that she is working on getting hearing aids but  they are not covered like her old ones were. She reports no falls and her sleep is good.  Her overall mood she states is good although she did have some loved ones passed this year.  Her son and her daughter (on phone) feel like their mother is doing great and have no major concerns just want to make sure they are doing everything they can for her.  Patient's blood pressure elevated in today's visit since she did not take her morning medication.  She did not eat breakfast because she plans to have a large lunch with her son after this visit.  She normally takes her meds with food otherwise it makes her nauseous.  She took medication while in visit today repeat still elevated but patient remains asymptomatic  Neuropsychiatric Symptoms (NPI-Q): [name] primary care-partner reports that [name] is not experiencing any symptoms.   Caregiver Burden: was assessed with the [place] Caregiver Burden Index.    The patient's care-partner scores 0, consistent with no to mild burden (0-10).   Functional Status: Instrumental Activities of Daily Living (IADL): Telephone:  Intact Travel:  Intact Shopping:  Intact Meal Preparation: Intact Housework/Hobbies:  Intact Medication Management: Intact-using pillbox Finances: Intact     Activities of Daily Living (ADL):  Bathing:  Intact Dressing: Intact Toileting:  Intact Transfer: Intact Feeding: Intact   The following portions of the patient's history were reviewed and updated as appropriate: allergies, current medications, past family history, past medical history, past social history, past surgical history and problem list.  Review of Systems Reviewed except for noted in HPI    Objective:   Physical Exam BP (!) 187/84 (BP Location: Left arm,  Patient Position: Sitting)   Pulse 55   Wt 56.7 kg (125 lb 1.6 oz)   SpO2 100%   General appearance: well appearing, not in distress CV: RRR, no murmurs  Pulm: CTAB posteriorly Brief Neuro  exam: CN II-XII grossly intact , impaired hearing L > R Gait: normal stance, stride length and turn   Brief Cognitive testing done with the [facility] Cognitive Assessement), a 30-point test of global cognitive function which is similar to the MMSE, but more sensitive for detecting MCI. she scores 17/30.  Executive Function/visuospatial: Trails: 0/1 Bed copy: 0/1 Clock draw: 0/3 Naming: 3/3  Attention: Digit span: 2/2 Vigilance: 1/1 Serial 7's: 1/3 Language: Repetition: 1/2 Fluency for F: 1/1 Abstraction: 2/2 Delayed recall: 0/5. she does not benefit from cues Orientation: 5/6   05/17/2023: MMSE 17/30, abnormal clock   MRI brian wo 06/25/2023: Brain: No evidence of acute infarct. No mass effect. No evidence of acute hemorrhage. No hydrocephalus. Moderate generalized parenchymal volume loss with ex vacuo ventricular and sulcal enlargement. Moderate changes of chronic small vessel disease. Numerous dilated perivascular spaces in the bilateral basal ganglia. Thinning of the corpus callosum with a suspected area of gliosis in the anterior callosal body. Scattered remote microhemorrhages in the bilateral parietal lobes (numbering 4 in total). No areas of superficial siderosis. Normal appearance of the major intracranial arteries and dural venous sinuses.   Lab Results  Component Value Date   VITAMINB12 478 05/15/2023    Lab Results  Component Value Date   TSH 1.814 05/15/2023       Assessment/Plan:   Diagnoses and all orders for this visit: MCI (mild cognitive impairment) Sensorineural hearing loss (SNHL) of left ear with unrestricted hearing of right ear     83 y.o. yo female with MCI and PMH as listed above here with her son for follow up. Patient testing and history obtained today is consistent with previous diagnosis of MCI.  She remains functionally intact without major concerns.  We did discuss at length her visual-spatial and executive functioning impairments on testing  in regards to her safety with driving.  Overall patient's testing remains somewhat stable although deficits more apparent in executive functioning. See plan of care below:  - Discussed safety concerns with  patient and family in detail: need for supervision and assistance in medication administration, need for supervision and assistance in management of finances, safety in reference to driving, and polypharmacy -We discussed having her children drive in the car with her to see how she is doing.  Her son has her on tracking on his phone.  We discussed not driving in inclement weather or at night.  Patient continues to drive to familiar places without difficulty.  We also discussed allowing her children to check over her pillbox.  Patient feels that she is very comfortable asking her children for support and help when needed -Patient is working on getting hearing aids -Reviewed again with patient and family mild cognitive impairment and how it differs from normal aging. - We have also discussed principles of healthy living, including diet such as the Mediterranean diet for cardiovascular health, importance of engagement and socialization, importance of physical activity.  Follow-up in 1 year, sooner if needed  Case discussed with Dr. Arrie   I spent 55 minutes face to face with the patient and family and at least 10 minutes of chart review and documentation; over 50% of that time was spent on counseling regarding diagnosis, disease prognosis, and management strategies for dementia  related behaviors.   This is a shared note. I support your right to access your health information in an open, easy manner. We are partners together to improve your health. If you are a patient or care partner with concerns about this note please either send a MyChart message to myself or give us  a call in the geriatric clinic business hours at [phone] or talk to us  at your next visit.  Electronically signed by:  Deidre Jenkins Slice, DO 06/02/2024 10:44 AM

## 2024-06-02 NOTE — Progress Notes (Signed)
 Office Visit Note  Patient: Sheila Hurst             Date of Birth: 08/05/1941           MRN: [id number]             PCP: Auston Opal, DO Referring: Auston Opal, DO Visit Date: 06/16/2024 Occupation: Data Unavailable  Subjective:  Medication monitoring  History of Present Illness: Chizaram Latino is a 83 y.o. female with osteoporosis and osteoarthritis.  Her last Prolia  injection was on Jan 29, 2024.  She denies any side effects from Prolia .  She has been taking calcium and vitamin D .  She has been having discomfort in the left knee joint.  She has an appointment coming up with the orthopedic surgeon.  She denies any discomfort in her lower back today.  Her blood pressure is elevated today.  She states she forgot to take her blood pressure medication this morning.    Activities of Daily Living:  Patient reports morning stiffness for 5-10 minutes.   Patient Denies nocturnal pain.  Difficulty dressing/grooming: Denies Difficulty climbing stairs: Reports Difficulty getting out of chair: Reports Difficulty using hands for taps, buttons, cutlery, and/or writing: Denies  Review of Systems  Constitutional:  Positive for fatigue.  HENT:  Negative for mouth sores and mouth dryness.   Eyes:  Negative for dryness.  Respiratory:  Positive for shortness of breath.   Cardiovascular:  Negative for chest pain and palpitations.  Gastrointestinal:  Negative for blood in stool, constipation and diarrhea.  Endocrine: Positive for increased urination.  Genitourinary:  Negative for involuntary urination.  Musculoskeletal:  Positive for gait problem, joint swelling and morning stiffness. Negative for joint pain, joint pain, myalgias, muscle weakness, muscle tenderness and myalgias.  Skin:  Negative for color change, rash, hair loss and sensitivity to sunlight.  Allergic/Immunologic: Negative for susceptible to infections.  Neurological:  Positive for dizziness. Negative for  headaches.  Hematological:  Negative for swollen glands.  Psychiatric/Behavioral:  Negative for depressed mood and sleep disturbance. The patient is not nervous/anxious.     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   Malignant neoplasm of head of pancreas (HCC) 10/01/2012   Hyperlipidemia    Arthritis    GERD (gastroesophageal reflux disease)    Hypertension    Pancreatic adenocarcinoma (HCC) 09/29/2012    Past Medical History:  Diagnosis Date   Arthritis    left knee   Bronchitis    GERD (gastroesophageal reflux disease)    no medication   Hyperlipidemia    Hypertension    Pancreatic mass 09/15/2012   Head of Pancreas, malignant cells consistent w/adenocarcinoma   Radiation 10/19/12-11/30/12   50.4 gray Pancreatic Ca/abdomen   Wears dentures    top   Wears glasses    Weight loss    Prior to Dx    Family History  Problem Relation Age of Onset   Alzheimer's disease Mother    Throat cancer Father    Rheum arthritis Sister    Heart murmur Sister    Osteoarthritis Sister    Prostate cancer Brother    Prostate cancer Brother    Stomach cancer Brother    Thyroid  disease Brother    Heart murmur Brother    Alzheimer's disease  Maternal Aunt    Pancreatic cancer Paternal Aunt    Alzheimer's disease Paternal Uncle    Past Surgical History:  Procedure Laterality Date   APPENDECTOMY  61's   Badder tack  09-2011   BELPHAROPTOSIS REPAIR     CARDIAC CATHETERIZATION  12/2006   CHOLECYSTECTOMY  12/2012   EUS  09/15/2012   Procedure: ESOPHAGEAL ENDOSCOPIC ULTRASOUND (EUS) RADIAL;  Surgeon: Elsie Cree, MD;  Location: [facility];  Service: Endoscopy;  Laterality: N/A;  + or - fna unsure of what scope   FINE NEEDLE ASPIRATION  09/15/2012    Procedure: FINE NEEDLE ASPIRATION (FNA) LINEAR;  Surgeon: Elsie Cree, MD;  Location: [facility];  Service: Endoscopy;  Laterality: N/A;   LAPAROSCOPY N/A 01/28/2013   Procedure: LAPAROSCOPY DIAGNOSTIC,  WHIPPLE;  Surgeon: Jina Nephew, MD;  Location: [facility];  Service: General;  Laterality: N/A;   PANCREATICODUODENECTOMY  12/2012   PORT-A-CATH REMOVAL Right 07/14/2014   Procedure: REMOVAL PORT-A-CATH;  Surgeon: Jina Nephew, MD;  Location: [facility];  Service: General;  Laterality: Right;   PORTACATH PLACEMENT N/A 03/18/2013   Procedure: INSERTION PORT-A-CATH;  Surgeon: Jina Nephew, MD;  Location: [facility];  Service: General;  Laterality: N/A;  Flouro time , 11 seconds.   TONSILLECTOMY     age 3   TOTAL ABDOMINAL HYSTERECTOMY W/ BILATERAL SALPINGOOPHORECTOMY  1993   TUBAL LIGATION  1977   WHIPPLE PROCEDURE N/A 01/28/2013   Procedure: WHIPPLE PROCEDURE;  Surgeon: Jina Nephew, MD;  Location: [facility];  Service: General;  Laterality: N/A;   Social History   Tobacco Use   Smoking status: Former    Current packs/day: 0.00    Average packs/day: 0.3 packs/day for 24.0 years (7.2 ttl pk-yrs)    Types: Cigarettes    Start date: 09/02/1954    Quit date: 09/02/1978    Years since quitting: 45.8    Passive exposure: Past   Smokeless tobacco: Never  Vaping Use   Vaping status: Never Used  Substance Use Topics   Alcohol use: No   Drug use: No   Social History   Social History Narrative   Widowed-lives alone   1 son , 1 daughter   Retired-customer service with [employer] History  Administered Date(s) Administered   INFLUENZA, HIGH DOSE SEASONAL PF 05/28/2023   Influenza-Unspecified 05/07/2018, 05/22/2022   Moderna SARS-COV2 Booster Vaccination 09/14/2020   Moderna Sars-Covid-2 Vaccination 10/25/2019, 11/23/2019   Zoster Recombinant(Shingrix) 05/22/2022   Zoster, Live 05/28/2023     Objective: Vital Signs: BP (!) 162/83    Pulse (!) 52   Temp 98.1 F (36.7 C)   Resp 12   Ht 5' (1.524 m)   Wt 123 lb 3.2 oz (55.9 kg)   BMI 24.06 kg/m    Physical Exam Vitals and nursing note reviewed.  Constitutional:      Appearance: She is well-developed.  HENT:     Head: Normocephalic and atraumatic.  Eyes:     Conjunctiva/sclera: Conjunctivae normal.  Cardiovascular:     Rate and Rhythm: Normal rate and regular rhythm.     Heart sounds: Normal heart sounds.  Pulmonary:     Effort: Pulmonary effort is normal.     Breath sounds: Normal breath sounds.  Abdominal:     General: Bowel sounds are normal.     Palpations: Abdomen is soft.  Musculoskeletal:     Cervical back: Normal range of motion.  Lymphadenopathy:     Cervical: No  cervical adenopathy.  Skin:    General: Skin is warm and dry.     Capillary Refill: Capillary refill takes less than 2 seconds.  Neurological:     Mental Status: She is alert and oriented to person, place, and time.  Psychiatric:        Behavior: Behavior normal.      Musculoskeletal Exam: Cervical, thoracic and lumbar spine were in good range of motion.  There was no SI joint tenderness.  Shoulder joints, elbow joints, wrist joints, MCPs, PIPs and DIPs were in good range of motion with no synovitis.  Bilateral PIP and DIP thickening was noted.  Hip joints were in good range of motion.  She had valgus deformity in her left knee joint and limited extension with warmth and swelling on palpation.  Right knee joint was in good range of motion.  There was no tenderness over ankles or MTPs.   CDAI Exam: CDAI Score: -- Patient Global: --; Provider Global: -- Swollen: --; Tender: -- Joint Exam 06/16/2024   No joint exam has been documented for this visit   There is currently no information documented on the homunculus. Go to the Rheumatology activity and complete the homunculus joint exam.  Investigation: No additional findings.  Imaging: No results found.  Recent Labs: Lab  Results  Component Value Date   WBC 10.2 10/19/2023   HGB 11.6 (L) 10/19/2023   PLT 168 10/19/2023   NA 141 11/14/2023   K 3.7 11/14/2023   CL 102 11/14/2023   CO2 33 (H) 11/14/2023   GLUCOSE 103 11/14/2023   BUN 18 11/14/2023   CREATININE 0.83 11/14/2023   BILITOT 0.6 11/14/2023   ALKPHOS 88 10/19/2023   AST 18 11/14/2023   ALT 12 11/14/2023   PROT 7.0 11/14/2023   ALBUMIN 3.9 10/19/2023   CALCIUM 9.5 11/14/2023   GFRAA 75 (L) 10/10/2013    Speciality Comments: History of radiation therapy-cannot take Tymlos or Forteo. Prolia  Jan 22, 2022  Procedures:  No procedures performed Allergies: Lisinopril, Metoprolol , Solifenacin, and Ondansetron    Assessment / Plan:     Visit Diagnoses: Age-related osteoporosis without current pathological fracture - DEXA 07/31/21: BMD forearm 0.514 with T-score - 4.2.  T score left femur total -3.2, BMD 0.602.  Not a candidate for Forteo/Tymlos h/o radiation therapy.  She has been on Prolia  since 04/24/2022.  Her last Prolia  injection was on Jan 29, 2024.  She has been tolerating Prolia  without any side effects.  She has not had a DEXA scan since July 31, 2021.  Will schedule a DEXA scan.  Vitamin D  deficiency - On vitamin D  1000 units daily. -She was given vitamin D  50,000 units once a week for 3 months in March 2025.  She did not come for repeat vitamin D  level.  Patient states she has been taking over-the-counter vitamin D .  Will check vitamin D  level today.  Plan: VITAMIN D  25 Hydroxy (Vit-D Deficiency, Fractures)  Medication monitoring encounter - First Prolia  injection Jan 22, 2022, last Prolia  Jan 29, 2024. - Plan: Comprehensive metabolic panel with GFR, CBC with Differential/Platelet  Primary osteoarthritis of both knees-she has severe osteoarthritis in her left knee joint with valgus deformity.  She limps when she walks.  She is not ready for left total knee replacement.  She is followed by orthopedics.  She has small effusion on palpation  today.  Patient states she has an appointment coming up with the orthopedic surgeon.  Degeneration of intervertebral disc of lumbar region  without discogenic back pain or lower extremity pain-she has intermittent discomfort in her back most likely related to abnormal gait.  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  Exocrine pancreatic insufficiency  Primary hypertension-blood pressure was elevated at 181/77.  Repeat blood pressure was 162/83.  Patient states that she did not take her blood pressure medication today.  She was advised to monitor blood pressure closely and follow-up with her PCP.  Dyslipidemia  Prediabetes  History of gastroesophageal reflux (GERD)  Orders: Orders Placed This Encounter  Procedures   DG Bone Density   Comprehensive metabolic panel with GFR   CBC with Differential/Platelet   VITAMIN D  25 Hydroxy (Vit-D Deficiency, Fractures)   No orders of the defined types were placed in this encounter.    Follow-Up Instructions: Return in about 6 months (around 12/15/2024) for Osteoarthritis, Osteoporosis.   Maya Nash, MD  Note - This record has been created using Animal nutritionist.  Chart creation errors have been sought, but may not always  have been located. Such creation errors do not reflect on  the standard of medical care.

## 2024-06-03 DIAGNOSIS — R7303 Prediabetes: Secondary | ICD-10-CM | POA: Diagnosis not present

## 2024-06-03 DIAGNOSIS — I10 Essential (primary) hypertension: Secondary | ICD-10-CM | POA: Diagnosis not present

## 2024-06-03 DIAGNOSIS — K219 Gastro-esophageal reflux disease without esophagitis: Secondary | ICD-10-CM | POA: Diagnosis not present

## 2024-06-03 DIAGNOSIS — M1990 Unspecified osteoarthritis, unspecified site: Secondary | ICD-10-CM | POA: Diagnosis not present

## 2024-06-03 DIAGNOSIS — M810 Age-related osteoporosis without current pathological fracture: Secondary | ICD-10-CM | POA: Diagnosis not present

## 2024-06-03 DIAGNOSIS — Z8507 Personal history of malignant neoplasm of pancreas: Secondary | ICD-10-CM | POA: Diagnosis not present

## 2024-06-03 DIAGNOSIS — E559 Vitamin D deficiency, unspecified: Secondary | ICD-10-CM | POA: Diagnosis not present

## 2024-06-03 DIAGNOSIS — E782 Mixed hyperlipidemia: Secondary | ICD-10-CM | POA: Diagnosis not present

## 2024-06-03 DIAGNOSIS — Z0000 Encounter for general adult medical examination without abnormal findings: Secondary | ICD-10-CM | POA: Diagnosis not present

## 2024-06-03 DIAGNOSIS — Z23 Encounter for immunization: Secondary | ICD-10-CM | POA: Diagnosis not present

## 2024-06-07 ENCOUNTER — Ambulatory Visit: Payer: Medicare Other | Admitting: Oncology

## 2024-06-08 ENCOUNTER — Telehealth: Payer: Self-pay | Admitting: Oncology

## 2024-06-08 NOTE — Telephone Encounter (Signed)
 Called and left detailed message on PT VM with rescheduled appt details.

## 2024-06-14 ENCOUNTER — Telehealth: Payer: Self-pay | Admitting: Pharmacist

## 2024-06-14 DIAGNOSIS — Z5181 Encounter for therapeutic drug level monitoring: Secondary | ICD-10-CM

## 2024-06-14 DIAGNOSIS — M810 Age-related osteoporosis without current pathological fracture: Secondary | ICD-10-CM

## 2024-06-14 NOTE — Telephone Encounter (Signed)
 Patient scheduled for Prolia  on 08/02/2024. She will need updated CBC/CMP/Vitamin D  at OV on 06/16/2024  Sherry Pennant, PharmD, MPH, BCPS, CPP Clinical Pharmacist [facility])

## 2024-06-16 ENCOUNTER — Encounter: Payer: Self-pay | Admitting: Rheumatology

## 2024-06-16 ENCOUNTER — Ambulatory Visit: Attending: Rheumatology | Admitting: Rheumatology

## 2024-06-16 VITALS — BP 162/83 | HR 52 | Temp 98.1°F | Resp 12 | Ht 60.0 in | Wt 123.2 lb

## 2024-06-16 DIAGNOSIS — K8681 Exocrine pancreatic insufficiency: Secondary | ICD-10-CM | POA: Diagnosis not present

## 2024-06-16 DIAGNOSIS — E559 Vitamin D deficiency, unspecified: Secondary | ICD-10-CM

## 2024-06-16 DIAGNOSIS — Z8719 Personal history of other diseases of the digestive system: Secondary | ICD-10-CM | POA: Diagnosis not present

## 2024-06-16 DIAGNOSIS — R7303 Prediabetes: Secondary | ICD-10-CM

## 2024-06-16 DIAGNOSIS — C259 Malignant neoplasm of pancreas, unspecified: Secondary | ICD-10-CM | POA: Diagnosis not present

## 2024-06-16 DIAGNOSIS — M51369 Other intervertebral disc degeneration, lumbar region without mention of lumbar back pain or lower extremity pain: Secondary | ICD-10-CM

## 2024-06-16 DIAGNOSIS — Z5181 Encounter for therapeutic drug level monitoring: Secondary | ICD-10-CM

## 2024-06-16 DIAGNOSIS — I10 Essential (primary) hypertension: Secondary | ICD-10-CM | POA: Diagnosis not present

## 2024-06-16 DIAGNOSIS — M26609 Unspecified temporomandibular joint disorder, unspecified side: Secondary | ICD-10-CM | POA: Diagnosis not present

## 2024-06-16 DIAGNOSIS — M170 Bilateral primary osteoarthritis of knee: Secondary | ICD-10-CM | POA: Diagnosis not present

## 2024-06-16 DIAGNOSIS — M810 Age-related osteoporosis without current pathological fracture: Secondary | ICD-10-CM | POA: Diagnosis not present

## 2024-06-16 DIAGNOSIS — E785 Hyperlipidemia, unspecified: Secondary | ICD-10-CM | POA: Diagnosis not present

## 2024-06-17 ENCOUNTER — Telehealth: Payer: Self-pay | Admitting: *Deleted

## 2024-06-17 ENCOUNTER — Ambulatory Visit: Payer: Self-pay | Admitting: Rheumatology

## 2024-06-17 LAB — COMPREHENSIVE METABOLIC PANEL WITH GFR
AG Ratio: 1.4 (calc) (ref 1.0–2.5)
ALT: 14 U/L (ref 6–29)
AST: 21 U/L (ref 10–35)
Albumin: 4.1 g/dL (ref 3.6–5.1)
Alkaline phosphatase (APISO): 57 U/L (ref 37–153)
BUN: 12 mg/dL (ref 7–25)
CO2: 30 mmol/L (ref 20–32)
Calcium: 9.6 mg/dL (ref 8.6–10.4)
Chloride: 103 mmol/L (ref 98–110)
Creat: 0.8 mg/dL (ref 0.60–0.95)
Globulin: 3 g/dL (ref 1.9–3.7)
Glucose, Bld: 96 mg/dL (ref 65–99)
Potassium: 4.1 mmol/L (ref 3.5–5.3)
Sodium: 141 mmol/L (ref 135–146)
Total Bilirubin: 0.7 mg/dL (ref 0.2–1.2)
Total Protein: 7.1 g/dL (ref 6.1–8.1)
eGFR: 74 mL/min/1.73m2 (ref >=60)

## 2024-06-17 LAB — CBC WITH DIFFERENTIAL/PLATELET
Absolute Lymphocytes: 1264 {cells}/uL (ref 850–3900)
Absolute Monocytes: 260 {cells}/uL (ref 200–950)
Basophils Absolute: 21 {cells}/uL (ref 0–200)
Basophils Relative: 0.5 %
Eosinophils Absolute: 92 {cells}/uL (ref 15–500)
Eosinophils Relative: 2.2 %
HCT: 37 % (ref 35.0–45.0)
Hemoglobin: 12.4 g/dL (ref 11.7–15.5)
MCH: 30.2 pg (ref 27.0–33.0)
MCHC: 33.5 g/dL (ref 32.0–36.0)
MCV: 90 fL (ref 80.0–100.0)
MPV: 10.4 fL (ref 7.5–12.5)
Monocytes Relative: 6.2 %
Neutro Abs: 2562 {cells}/uL (ref 1500–7800)
Neutrophils Relative %: 61 %
Platelets: 214 Thousand/uL (ref 140–400)
RBC: 4.11 Million/uL (ref 3.80–5.10)
RDW: 12.8 % (ref 11.0–15.0)
Total Lymphocyte: 30.1 %
WBC: 4.2 Thousand/uL (ref 3.8–10.8)

## 2024-06-17 LAB — VITAMIN D 25 HYDROXY (VIT D DEFICIENCY, FRACTURES): Vit D, 25-Hydroxy: 27 ng/mL — ABNORMAL LOW (ref 30–100)

## 2024-06-17 NOTE — Progress Notes (Signed)
 Vitamin D  is low at 27.  Patient should take vitamin D  2000 units daily.  CMP and CBC are normal.

## 2024-06-17 NOTE — Telephone Encounter (Signed)
 Patient advised to take her vitamin D  on a regular basis. Will recheck vitamin D  level with her next labs. Patient expressed understanding.

## 2024-06-17 NOTE — Telephone Encounter (Signed)
 Please advise her to take vitamin D  on a regular basis.  Will recheck vitamin D  level with her next labs.

## 2024-06-17 NOTE — Telephone Encounter (Signed)
 We contacted patient to advised her that her Vitamin D  was low a 26. We advised patient she should take Vitamin 2000 units daily. Patient called back and left message to advise that she is already taking 2000 units daily. Patient would like to know if she should take more than that. Please advise.

## 2024-06-23 DIAGNOSIS — M1712 Unilateral primary osteoarthritis, left knee: Secondary | ICD-10-CM | POA: Diagnosis not present

## 2024-06-23 DIAGNOSIS — R2242 Localized swelling, mass and lump, left lower limb: Secondary | ICD-10-CM | POA: Diagnosis not present

## 2024-07-05 ENCOUNTER — Telehealth: Payer: Self-pay | Admitting: Oncology

## 2024-07-05 NOTE — Telephone Encounter (Signed)
PT confirmed appt for tomorrow

## 2024-07-06 ENCOUNTER — Inpatient Hospital Stay: Attending: Oncology | Admitting: Oncology

## 2024-07-06 VITALS — BP 145/88 | HR 56 | Temp 97.8°F | Resp 18 | Ht 60.0 in | Wt 120.3 lb

## 2024-07-06 DIAGNOSIS — E785 Hyperlipidemia, unspecified: Secondary | ICD-10-CM | POA: Insufficient documentation

## 2024-07-06 DIAGNOSIS — Z9221 Personal history of antineoplastic chemotherapy: Secondary | ICD-10-CM | POA: Insufficient documentation

## 2024-07-06 DIAGNOSIS — Z79899 Other long term (current) drug therapy: Secondary | ICD-10-CM | POA: Insufficient documentation

## 2024-07-06 DIAGNOSIS — C250 Malignant neoplasm of head of pancreas: Secondary | ICD-10-CM

## 2024-07-06 DIAGNOSIS — C259 Malignant neoplasm of pancreas, unspecified: Secondary | ICD-10-CM | POA: Diagnosis present

## 2024-07-06 DIAGNOSIS — I10 Essential (primary) hypertension: Secondary | ICD-10-CM | POA: Diagnosis not present

## 2024-07-06 DIAGNOSIS — K7689 Other specified diseases of liver: Secondary | ICD-10-CM | POA: Diagnosis not present

## 2024-07-06 DIAGNOSIS — Z923 Personal history of irradiation: Secondary | ICD-10-CM | POA: Insufficient documentation

## 2024-07-06 DIAGNOSIS — Z800 Family history of malignant neoplasm of digestive organs: Secondary | ICD-10-CM | POA: Diagnosis not present

## 2024-07-06 DIAGNOSIS — K439 Ventral hernia without obstruction or gangrene: Secondary | ICD-10-CM | POA: Diagnosis not present

## 2024-07-06 DIAGNOSIS — M25569 Pain in unspecified knee: Secondary | ICD-10-CM | POA: Diagnosis not present

## 2024-07-06 DIAGNOSIS — Z86718 Personal history of other venous thrombosis and embolism: Secondary | ICD-10-CM | POA: Diagnosis not present

## 2024-07-06 NOTE — Progress Notes (Signed)
  [facility] OFFICE PROGRESS NOTE   Diagnosis: Pancreas cancer  INTERVAL HISTORY:   Sheila Hurst returns as scheduled.  She feels well.  Good appetite.  She has knee pain.  No other pain.  The abdominal hernia is larger.  The hernia is not painful.  Objective:  Vital signs in last 24 hours:  Blood pressure (!) 145/88, pulse (!) 56, temperature 97.8 F (36.6 C), temperature source Temporal, resp. rate 18, height 5' (1.524 m), weight 120 lb 4.8 oz (54.6 kg), SpO2 100%.    Lymphatics: No cervical, supraclavicular, axillary, or inguinal nodes Resp: Lungs clear bilaterally Cardio: Regular rate and rhythm GI: No hepatosplenomegaly, no apparent ascites, no mass, reducible incisional hernia Vascular: No leg edema  Lab Results:  Lab Results  Component Value Date   WBC 4.2 06/16/2024   HGB 12.4 06/16/2024   HCT 37.0 06/16/2024   MCV 90.0 06/16/2024   PLT 214 06/16/2024   NEUTROABS 2,562 06/16/2024    CMP  Lab Results  Component Value Date   NA 141 06/16/2024   K 4.1 06/16/2024   CL 103 06/16/2024   CO2 30 06/16/2024   GLUCOSE 96 06/16/2024   BUN 12 06/16/2024   CREATININE 0.80 06/16/2024   CALCIUM 9.6 06/16/2024   PROT 7.1 06/16/2024   ALBUMIN 3.9 10/19/2023   AST 21 06/16/2024   ALT 14 06/16/2024   ALKPHOS 88 10/19/2023   BILITOT 0.7 06/16/2024   GFRNONAA >60 10/19/2023   GFRAA 75 (L) 10/10/2013    Lab Results  Component Value Date   RJW800 32 12/17/2019    Medications: I have reviewed the patient's current medications.   Assessment/Plan: Adenocarcinoma of the pancreas; mass in the neck of the pancreas, status post EUS guided biopsy 09/15/2012 with the cytology confirming malignant cells consistent with adenocarcinoma, T3 N0 by ultrasound. 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 Elevated CA 19-9. She began radiation and concurrent Xeloda  chemotherapy on 10/19/2012, radiation  completed 11/30/2012 -Whipple procedure 01/28/2013 with no residual carcinoma seen and stromal fibrosis consistent with post treatment effect.   -She began adjuvant gemcitabine  04/01/2013, completed 07/22/2013.  - CT abdomen/pelvis 01/01/2016 showed no evidence of local recurrence or metastatic disease. There was interval increase in the size of a ventral abdominal wall hernia. -CT abdomen/pelvis 12/07/2021-ventral hernia -CT abdomen/pelvis 10/19/2023-stable ventral hernia at the umbilicus History of Pain secondary to pancreas cancer Right liver cyst. Smaller on MRI 09/28/2012 compared to 11/03/2008. Hypertension  Hyperlipidemia. Family history of pancreas cancer (paternal aunt). Anorexia/weight loss. Resolved. Bradycardia when here on 11/11/2012. Atenolol  was placed on hold. Right leg DVT documented on a Doppler 11/26/2012-no longer on anticoagulation. History of Hypokalemia. She is still taking a potassium supplement. CT abdomen/pelvis 10/10/2013 done to evaluate abdominal pain. No acute abnormality identified in the abdomen and pelvis. Postsurgical changes at the pancreas. Stable liver cyst. Port-A-Cath removal 07/14/2014        Disposition: Sheila Hurst remains in clinical remission from pancreas cancer.  She would like to continue follow-up at the Cancer center.  She will return for an office visit in 1 year.  She appears to have an incisional/ventral hernia.  She will seek medical attention if this area becomes painful.  Arley Hof, MD  07/06/2024  11:21 AM

## 2024-08-02 ENCOUNTER — Ambulatory Visit

## 2024-08-02 MED ORDER — DENOSUMAB 60 MG/ML ~~LOC~~ SOSY: 60.0000 mg | PREFILLED_SYRINGE | Freq: Once | SUBCUTANEOUS | Status: AC

## 2024-08-06 NOTE — Telephone Encounter (Signed)
 Patient no-show to Prolia  appt on 08/02/2024. Pt advised to call infusion center to reschedule.  Infusion center: [facility] [phone]) [place]

## 2024-10-01 ENCOUNTER — Other Ambulatory Visit: Payer: Self-pay | Admitting: Family Medicine

## 2024-10-01 DIAGNOSIS — Z1231 Encounter for screening mammogram for malignant neoplasm of breast: Secondary | ICD-10-CM

## 2024-12-16 ENCOUNTER — Other Ambulatory Visit (HOSPITAL_BASED_OUTPATIENT_CLINIC_OR_DEPARTMENT_OTHER)

## 2024-12-16 ENCOUNTER — Ambulatory Visit

## 2024-12-17 ENCOUNTER — Ambulatory Visit: Admitting: Rheumatology

## 2025-07-07 ENCOUNTER — Inpatient Hospital Stay: Admitting: Oncology
